# Patient Record
Sex: Female | Born: 1940 | Race: White | Hispanic: No | Marital: Single | Smoking: Former smoker
Health system: Southern US, Community
[De-identification: ages and names within clinical notes are randomized; demographics above are authoritative.]

## PROBLEM LIST (undated history)

## (undated) DIAGNOSIS — I214 Non-ST elevation (NSTEMI) myocardial infarction: Secondary | ICD-10-CM

## (undated) DIAGNOSIS — E119 Type 2 diabetes mellitus without complications: Secondary | ICD-10-CM

## (undated) DIAGNOSIS — I10 Essential (primary) hypertension: Secondary | ICD-10-CM

## (undated) DIAGNOSIS — I639 Cerebral infarction, unspecified: Secondary | ICD-10-CM

## (undated) DIAGNOSIS — I719 Aortic aneurysm of unspecified site, without rupture: Secondary | ICD-10-CM

## (undated) HISTORY — PX: CORONARY ANGIOPLASTY: SHX604

## (undated) HISTORY — PX: APPENDECTOMY: SHX54

## (undated) HISTORY — DX: Non-ST elevation (NSTEMI) myocardial infarction: I21.4

## (undated) HISTORY — PX: CARDIAC CATHETERIZATION: SHX172

## (undated) HISTORY — PX: COLON SURGERY: SHX602

---

## 2021-04-04 ENCOUNTER — Emergency Department (HOSPITAL_BASED_OUTPATIENT_CLINIC_OR_DEPARTMENT_OTHER): Payer: Medicare Other

## 2021-04-04 ENCOUNTER — Encounter (HOSPITAL_BASED_OUTPATIENT_CLINIC_OR_DEPARTMENT_OTHER): Payer: Self-pay | Admitting: Urology

## 2021-04-04 ENCOUNTER — Inpatient Hospital Stay (HOSPITAL_BASED_OUTPATIENT_CLINIC_OR_DEPARTMENT_OTHER)
Admission: EM | Admit: 2021-04-04 | Discharge: 2021-04-13 | DRG: 036 | Disposition: A | Discharge status: Home Health | Payer: Medicare Other | Attending: Internal Medicine | Admitting: Internal Medicine

## 2021-04-04 DIAGNOSIS — E119 Type 2 diabetes mellitus without complications: Secondary | ICD-10-CM | POA: Diagnosis not present

## 2021-04-04 DIAGNOSIS — I129 Hypertensive chronic kidney disease with stage 1 through stage 4 chronic kidney disease, or unspecified chronic kidney disease: Secondary | ICD-10-CM | POA: Diagnosis present

## 2021-04-04 DIAGNOSIS — Z20822 Contact with and (suspected) exposure to covid-19: Secondary | ICD-10-CM | POA: Diagnosis present

## 2021-04-04 DIAGNOSIS — Z79890 Hormone replacement therapy (postmenopausal): Secondary | ICD-10-CM

## 2021-04-04 DIAGNOSIS — Z95828 Presence of other vascular implants and grafts: Secondary | ICD-10-CM | POA: Diagnosis not present

## 2021-04-04 DIAGNOSIS — Z883 Allergy status to other anti-infective agents status: Secondary | ICD-10-CM

## 2021-04-04 DIAGNOSIS — R471 Dysarthria and anarthria: Secondary | ICD-10-CM | POA: Diagnosis present

## 2021-04-04 DIAGNOSIS — R29700 NIHSS score 0: Secondary | ICD-10-CM | POA: Diagnosis present

## 2021-04-04 DIAGNOSIS — I712 Thoracic aortic aneurysm, without rupture: Secondary | ICD-10-CM | POA: Diagnosis present

## 2021-04-04 DIAGNOSIS — I63411 Cerebral infarction due to embolism of right middle cerebral artery: Secondary | ICD-10-CM | POA: Diagnosis not present

## 2021-04-04 DIAGNOSIS — R29810 Facial weakness: Secondary | ICD-10-CM | POA: Diagnosis present

## 2021-04-04 DIAGNOSIS — Z79899 Other long term (current) drug therapy: Secondary | ICD-10-CM

## 2021-04-04 DIAGNOSIS — D649 Anemia, unspecified: Secondary | ICD-10-CM | POA: Diagnosis not present

## 2021-04-04 DIAGNOSIS — I252 Old myocardial infarction: Secondary | ICD-10-CM

## 2021-04-04 DIAGNOSIS — R4701 Aphasia: Secondary | ICD-10-CM | POA: Diagnosis present

## 2021-04-04 DIAGNOSIS — R1310 Dysphagia, unspecified: Secondary | ICD-10-CM | POA: Diagnosis present

## 2021-04-04 DIAGNOSIS — R339 Retention of urine, unspecified: Secondary | ICD-10-CM | POA: Diagnosis not present

## 2021-04-04 DIAGNOSIS — I2510 Atherosclerotic heart disease of native coronary artery without angina pectoris: Secondary | ICD-10-CM | POA: Diagnosis present

## 2021-04-04 DIAGNOSIS — I6389 Other cerebral infarction: Secondary | ICD-10-CM | POA: Diagnosis not present

## 2021-04-04 DIAGNOSIS — I6340 Cerebral infarction due to embolism of unspecified cerebral artery: Secondary | ICD-10-CM | POA: Insufficient documentation

## 2021-04-04 DIAGNOSIS — E1122 Type 2 diabetes mellitus with diabetic chronic kidney disease: Secondary | ICD-10-CM | POA: Diagnosis present

## 2021-04-04 DIAGNOSIS — Z882 Allergy status to sulfonamides status: Secondary | ICD-10-CM

## 2021-04-04 DIAGNOSIS — Z7984 Long term (current) use of oral hypoglycemic drugs: Secondary | ICD-10-CM

## 2021-04-04 DIAGNOSIS — G4733 Obstructive sleep apnea (adult) (pediatric): Secondary | ICD-10-CM | POA: Diagnosis present

## 2021-04-04 DIAGNOSIS — E039 Hypothyroidism, unspecified: Secondary | ICD-10-CM

## 2021-04-04 DIAGNOSIS — Z91040 Latex allergy status: Secondary | ICD-10-CM

## 2021-04-04 DIAGNOSIS — Z006 Encounter for examination for normal comparison and control in clinical research program: Secondary | ICD-10-CM

## 2021-04-04 DIAGNOSIS — E785 Hyperlipidemia, unspecified: Secondary | ICD-10-CM | POA: Diagnosis present

## 2021-04-04 DIAGNOSIS — E1151 Type 2 diabetes mellitus with diabetic peripheral angiopathy without gangrene: Secondary | ICD-10-CM | POA: Diagnosis present

## 2021-04-04 DIAGNOSIS — E1165 Type 2 diabetes mellitus with hyperglycemia: Secondary | ICD-10-CM | POA: Diagnosis present

## 2021-04-04 DIAGNOSIS — N182 Chronic kidney disease, stage 2 (mild): Secondary | ICD-10-CM | POA: Diagnosis present

## 2021-04-04 DIAGNOSIS — Z881 Allergy status to other antibiotic agents status: Secondary | ICD-10-CM

## 2021-04-04 DIAGNOSIS — I6521 Occlusion and stenosis of right carotid artery: Secondary | ICD-10-CM

## 2021-04-04 DIAGNOSIS — Z885 Allergy status to narcotic agent status: Secondary | ICD-10-CM

## 2021-04-04 DIAGNOSIS — Z87891 Personal history of nicotine dependence: Secondary | ICD-10-CM | POA: Diagnosis not present

## 2021-04-04 DIAGNOSIS — Z833 Family history of diabetes mellitus: Secondary | ICD-10-CM | POA: Diagnosis not present

## 2021-04-04 DIAGNOSIS — I700 Atherosclerosis of aorta: Secondary | ICD-10-CM | POA: Diagnosis present

## 2021-04-04 DIAGNOSIS — Z7902 Long term (current) use of antithrombotics/antiplatelets: Secondary | ICD-10-CM

## 2021-04-04 DIAGNOSIS — I6523 Occlusion and stenosis of bilateral carotid arteries: Secondary | ICD-10-CM | POA: Diagnosis not present

## 2021-04-04 DIAGNOSIS — I10 Essential (primary) hypertension: Secondary | ICD-10-CM | POA: Diagnosis not present

## 2021-04-04 DIAGNOSIS — E1159 Type 2 diabetes mellitus with other circulatory complications: Secondary | ICD-10-CM | POA: Diagnosis not present

## 2021-04-04 DIAGNOSIS — Z7982 Long term (current) use of aspirin: Secondary | ICD-10-CM

## 2021-04-04 DIAGNOSIS — Z8673 Personal history of transient ischemic attack (TIA), and cerebral infarction without residual deficits: Secondary | ICD-10-CM | POA: Diagnosis not present

## 2021-04-04 DIAGNOSIS — Z955 Presence of coronary angioplasty implant and graft: Secondary | ICD-10-CM | POA: Diagnosis not present

## 2021-04-04 DIAGNOSIS — I7121 Aneurysm of the ascending aorta, without rupture: Secondary | ICD-10-CM

## 2021-04-04 DIAGNOSIS — D631 Anemia in chronic kidney disease: Secondary | ICD-10-CM | POA: Diagnosis present

## 2021-04-04 DIAGNOSIS — I639 Cerebral infarction, unspecified: Secondary | ICD-10-CM | POA: Diagnosis not present

## 2021-04-04 DIAGNOSIS — Z794 Long term (current) use of insulin: Secondary | ICD-10-CM

## 2021-04-04 DIAGNOSIS — I63231 Cerebral infarction due to unspecified occlusion or stenosis of right carotid arteries: Secondary | ICD-10-CM | POA: Diagnosis not present

## 2021-04-04 HISTORY — DX: Cerebral infarction, unspecified: I63.9

## 2021-04-04 HISTORY — DX: Essential (primary) hypertension: I10

## 2021-04-04 HISTORY — DX: Type 2 diabetes mellitus without complications: E11.9

## 2021-04-04 HISTORY — DX: Aortic aneurysm of unspecified site, without rupture: I71.9

## 2021-04-04 LAB — CBC WITH DIFFERENTIAL/PLATELET
Abs Immature Granulocytes: 0.04 10*3/uL (ref 0.00–0.07)
Basophils Absolute: 0.1 10*3/uL (ref 0.0–0.1)
Basophils Relative: 1 %
Eosinophils Absolute: 0.3 10*3/uL (ref 0.0–0.5)
Eosinophils Relative: 3 %
HCT: 37.6 % (ref 36.0–46.0)
Hemoglobin: 11.9 g/dL — ABNORMAL LOW (ref 12.0–15.0)
Immature Granulocytes: 0 %
Lymphocytes Relative: 22 %
Lymphs Abs: 2.4 10*3/uL (ref 0.7–4.0)
MCH: 26.3 pg (ref 26.0–34.0)
MCHC: 31.6 g/dL (ref 30.0–36.0)
MCV: 83.2 fL (ref 80.0–100.0)
Monocytes Absolute: 0.9 10*3/uL (ref 0.1–1.0)
Monocytes Relative: 9 %
Neutro Abs: 7.1 10*3/uL (ref 1.7–7.7)
Neutrophils Relative %: 65 %
Platelets: 274 10*3/uL (ref 150–400)
RBC: 4.52 MIL/uL (ref 3.87–5.11)
RDW: 15.9 % — ABNORMAL HIGH (ref 11.5–15.5)
WBC: 10.8 10*3/uL — ABNORMAL HIGH (ref 4.0–10.5)
nRBC: 0 % (ref 0.0–0.2)

## 2021-04-04 LAB — URINALYSIS, MICROSCOPIC (REFLEX): RBC / HPF: NONE SEEN RBC/hpf (ref 0–5)

## 2021-04-04 LAB — RAPID URINE DRUG SCREEN, HOSP PERFORMED
Amphetamines: NOT DETECTED
Barbiturates: NOT DETECTED
Benzodiazepines: NOT DETECTED
Cocaine: NOT DETECTED
Opiates: NOT DETECTED
Tetrahydrocannabinol: NOT DETECTED

## 2021-04-04 LAB — APTT: aPTT: 30 seconds (ref 24–36)

## 2021-04-04 LAB — PROTIME-INR
INR: 1.1 (ref 0.8–1.2)
Prothrombin Time: 14.5 seconds (ref 11.4–15.2)

## 2021-04-04 LAB — COMPREHENSIVE METABOLIC PANEL
ALT: 10 U/L (ref 0–44)
AST: 17 U/L (ref 15–41)
Albumin: 3.6 g/dL (ref 3.5–5.0)
Alkaline Phosphatase: 96 U/L (ref 38–126)
Anion gap: 8 (ref 5–15)
BUN: 26 mg/dL — ABNORMAL HIGH (ref 8–23)
CO2: 27 mmol/L (ref 22–32)
Calcium: 9.4 mg/dL (ref 8.9–10.3)
Chloride: 101 mmol/L (ref 98–111)
Creatinine, Ser: 1.09 mg/dL — ABNORMAL HIGH (ref 0.44–1.00)
GFR, Estimated: 52 mL/min — ABNORMAL LOW (ref >60)
Glucose, Bld: 168 mg/dL — ABNORMAL HIGH (ref 70–99)
Potassium: 4 mmol/L (ref 3.5–5.1)
Sodium: 136 mmol/L (ref 135–145)
Total Bilirubin: 0.7 mg/dL (ref 0.3–1.2)
Total Protein: 6.8 g/dL (ref 6.5–8.1)

## 2021-04-04 LAB — URINALYSIS, ROUTINE W REFLEX MICROSCOPIC
Bilirubin Urine: NEGATIVE
Glucose, UA: NEGATIVE mg/dL
Hgb urine dipstick: NEGATIVE
Ketones, ur: NEGATIVE mg/dL
Leukocytes,Ua: NEGATIVE
Nitrite: POSITIVE — AB
Protein, ur: NEGATIVE mg/dL
Specific Gravity, Urine: 1.02 (ref 1.005–1.030)
pH: 7 (ref 5.0–8.0)

## 2021-04-04 LAB — RESP PANEL BY RT-PCR (FLU A&B, COVID) ARPGX2
Influenza A by PCR: NEGATIVE
Influenza B by PCR: NEGATIVE
SARS Coronavirus 2 by RT PCR: NEGATIVE

## 2021-04-04 LAB — CBG MONITORING, ED: Glucose-Capillary: 173 mg/dL — ABNORMAL HIGH (ref 70–99)

## 2021-04-04 LAB — ETHANOL: Alcohol, Ethyl (B): <10 mg/dL (ref <10)

## 2021-04-04 IMAGING — CT CT ANGIO HEAD
1 of 8 series · 6 of 33 positions shown · IV contrast (Omnipaque)
Comparison: Prior head CT from earlier the same day.

CLINICAL DATA: Initial evaluation for acute stroke.

EXAM:
CT ANGIOGRAPHY HEAD AND NECK
TECHNIQUE: Multidetector CT imaging of the head and neck was performed using
the standard protocol during bolus administration of intravenous
contrast. Multiplanar CT image reconstructions and MIPs were
obtained to evaluate the vascular anatomy. Carotid stenosis
measurements (when applicable) are obtained utilizing NASCET
criteria, using the distal internal carotid diameter as the
denominator.
CONTRAST:  60mL OMNIPAQUE IOHEXOL 350 MG/ML SOLN

[Series 7: axial thin · axial · 0.58mm/px · z∈[+980,+1254]mm · 6 of 383 slices shown]
[im 55/383  soft-tissue]
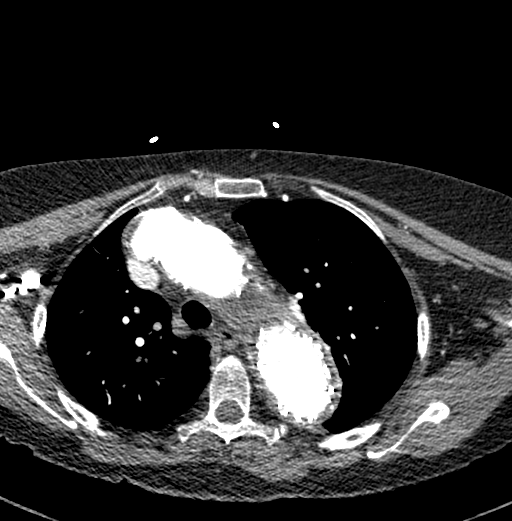
[im 110/383  bone]
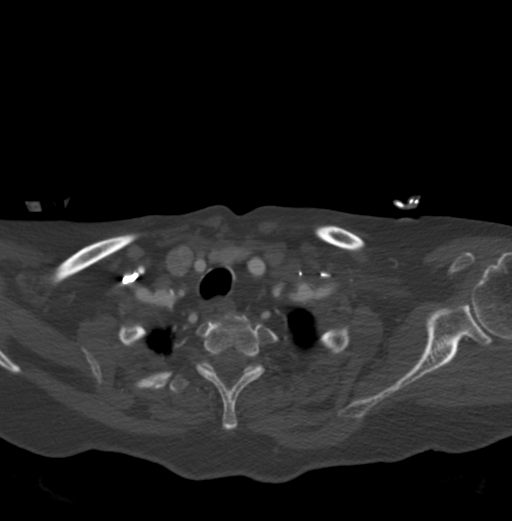
[im 164/383  soft-tissue]
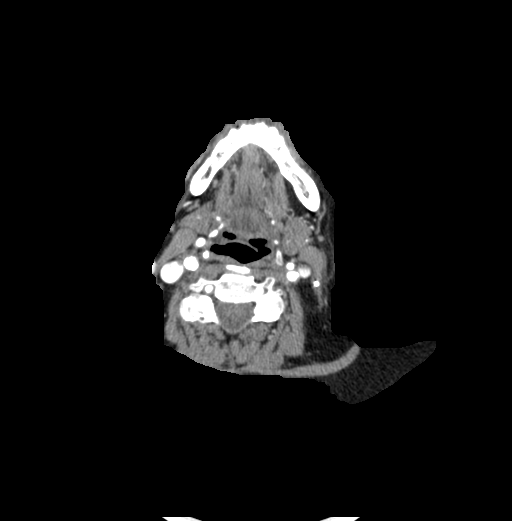
[im 219/383  bone]
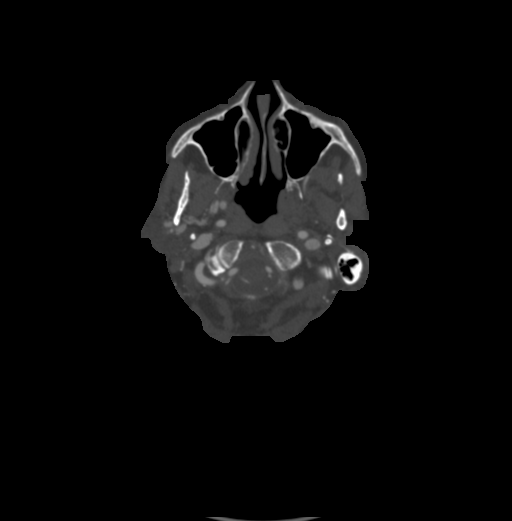
[im 273/383  soft-tissue]
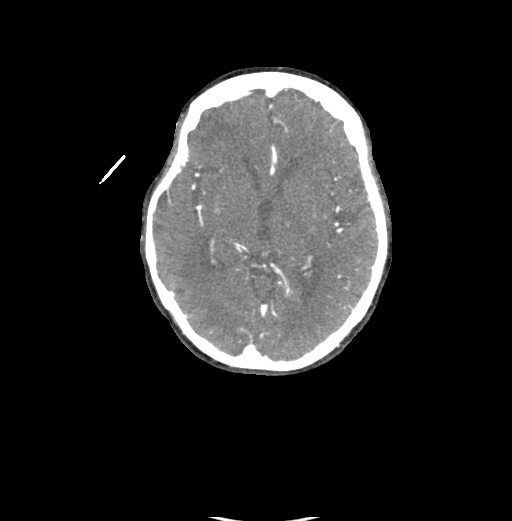
[im 328/383  bone]
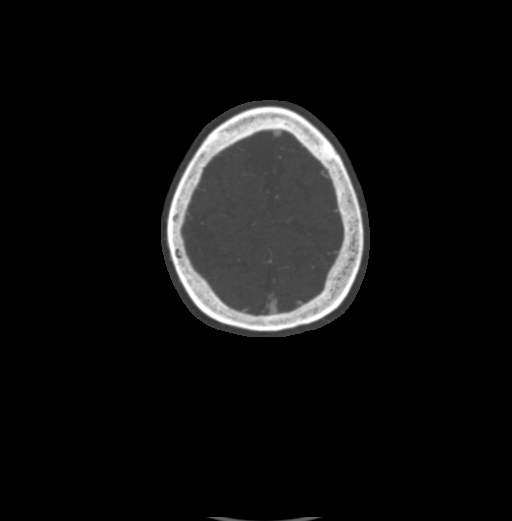

[6 of 33 positions shown; findings below may reference images not displayed]

FINDINGS: CTA NECK FINDINGS

Aortic arch: Aneurysmal dilatation of the visualized intrathoracic
aorta with sequelae of prior stent endograft repair, partially
visualized. Origin of the innominate artery is markedly irregular
without high-grade stenosis. Left subclavian artery occluded at its
origin. Left common to subclavian artery graft in place with
perfusion of the left subclavian artery distally. Widely patent flow
through the graft.

Right carotid system: Right CCA patent to the bifurcation without
stenosis. Bulky calcified plaque about the right carotid bulb with
associated 50% stenosis by NASCET criteria. Right ICA widely patent
distally.

Left carotid system: Left CCA patent from its origin to the
bifurcation without stenosis. Bulky calcified plaque about the left
carotid bulb/proximal left ICA with associated 40% stenosis by
NASCET criteria. Left ICA widely patent distally.

Vertebral arteries: Both vertebral arteries arise from subclavian
arteries. Vertebral arteries widely patent within the neck without
stenosis or other abnormality.

Skeleton: No visible discrete osseous lesions.

Other neck: Postsurgical changes present within the left neck.

Upper chest: Cardiomegaly, partially visualized.

Review of the MIP images confirms the above findings

CTA HEAD FINDINGS

Anterior circulation: Petrous segments patent bilaterally. Scattered
atheromatous change throughout the carotid siphons with associated
moderate multifocal narrowing. A1 segments patent bilaterally.
Normal anterior communicating artery complex. Anterior cerebral
arteries widely patent bilaterally. No M1 stenosis or occlusion.
Distal MCA branches well perfused and symmetric.

Posterior circulation: Both V4 segments patent to the
vertebrobasilar junction without stenosis. Both PICA origins patent
and normal. Basilar widely patent to its distal aspect without
stenosis. Superior cerebellar arteries patent bilaterally. Both PCAs
primarily supplied via the basilar and are well perfused to there
distal aspects.

Venous sinuses: Patent allowing for timing the contrast bolus.

Anatomic variants: None significant.  No visible aneurysm.

Review of the MIP images confirms the above findings
IMPRESSION: 1. Negative CTA for large vessel occlusion.
2. Bulky calcified plaque about the carotid bifurcations
bilaterally, with associated stenoses of up to 50% on the right and
40% on the left.
3. Occluded left subclavian artery at its origin. Left common to
subclavian artery graft in place with perfusion of the left
subclavian artery distally. Widely patent flow through the graft.
4. Aneurysmal dilatation of the intrathoracic aorta with sequelae of
prior stent endograft repair, partially visualized.
5. Cardiomegaly.

## 2021-04-04 IMAGING — CT CT HEAD W/O CM
4 series · 15 of 47 positions shown, 17 images · non-contrast
Comparison: None.

CLINICAL DATA: Neuro deficit, acute, stroke suspected

EXAM:
CT HEAD WITHOUT CONTRAST
TECHNIQUE: Contiguous axial images were obtained from the base of the skull
through the vertex without intravenous contrast.

[Series 2: head wo · axial · 0.41mm/px · z∈[+1124,+1224]mm · 6 of 29 slices shown, 8 images]
[im 5/29  brain]
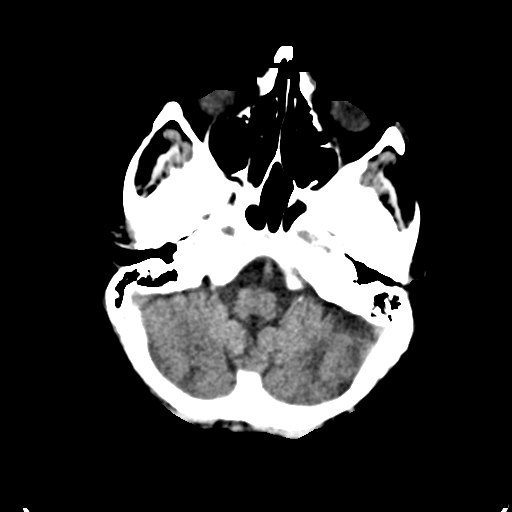
[im 5/29  bone]
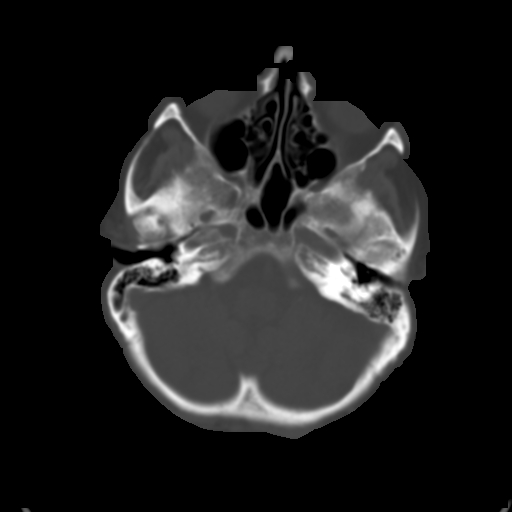
[im 9/29  brain]
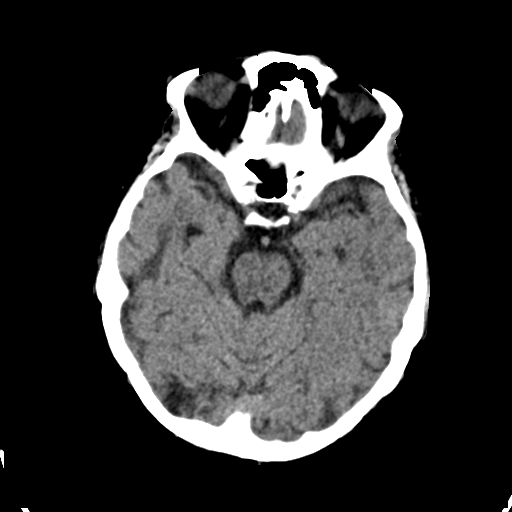
[im 13/29  brain]
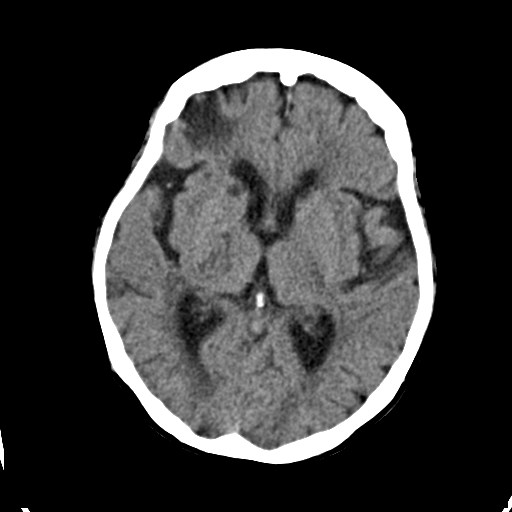
[im 17/29  brain]
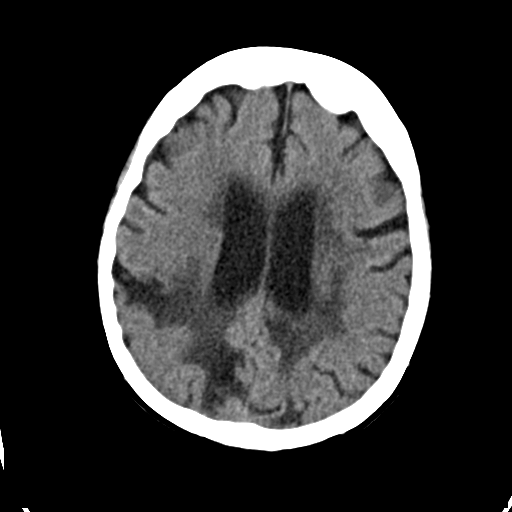
[im 21/29  brain]
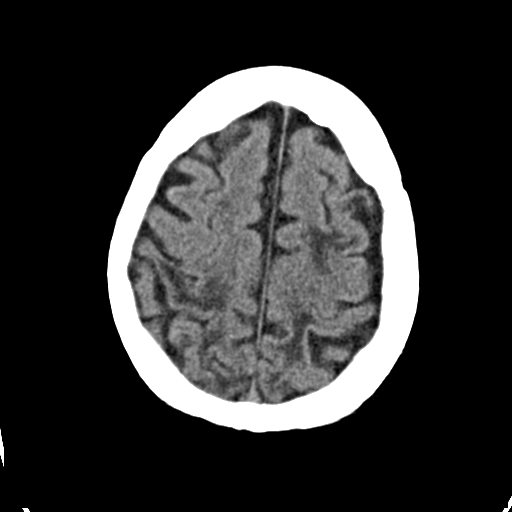
[im 21/29  bone]
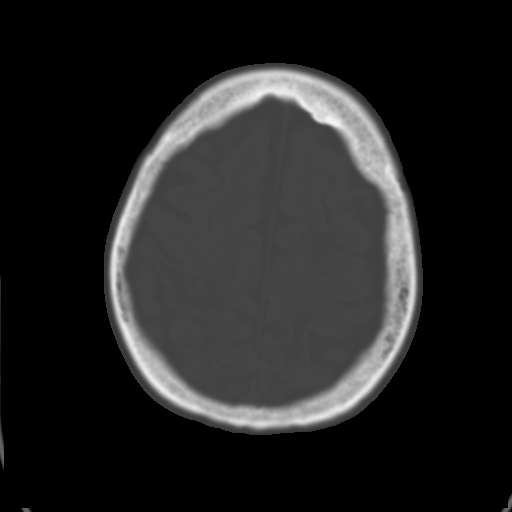
[im 25/29  brain]
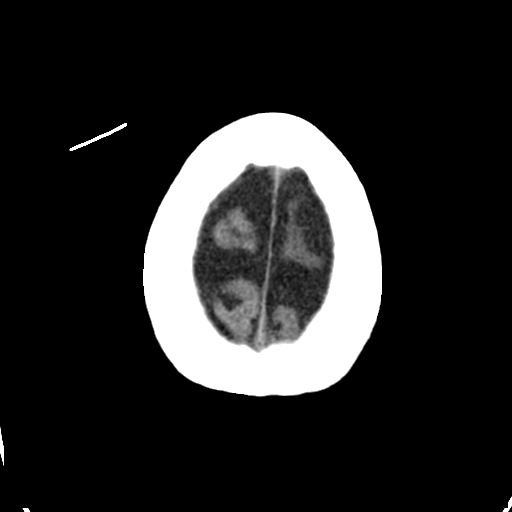

[Series 3: head bone · axial · 0.41mm/px · z∈[+1118,+1154]mm · 3 of 77 slices shown]
[im 8/77  bone]
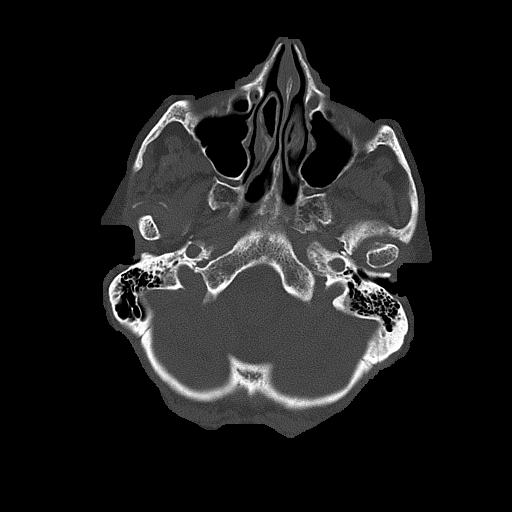
[im 15/77  bone]
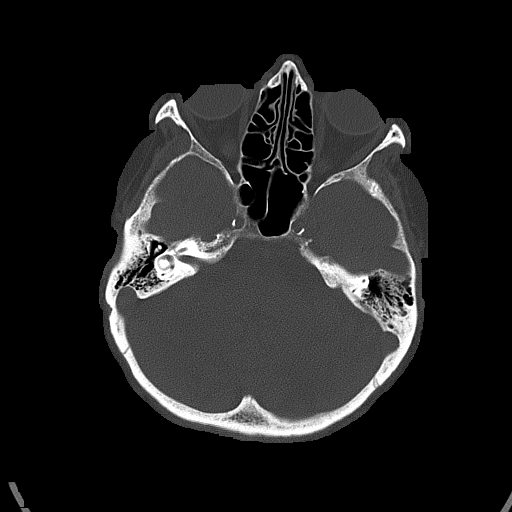
[im 26/77  bone]
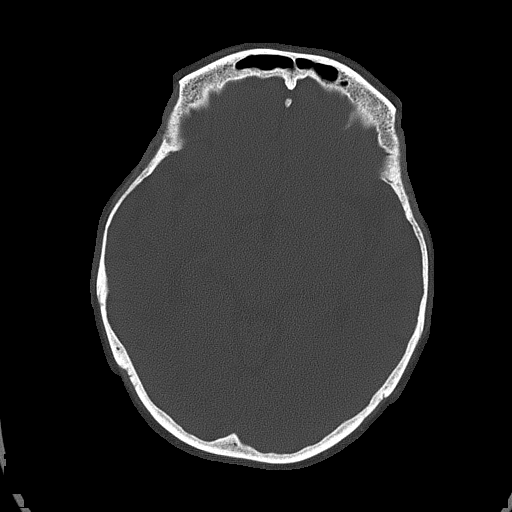

[Series 4: coronal soft · coronal · 0.30mm/px · 3 of 67 slices shown]
[im 23/67  brain]
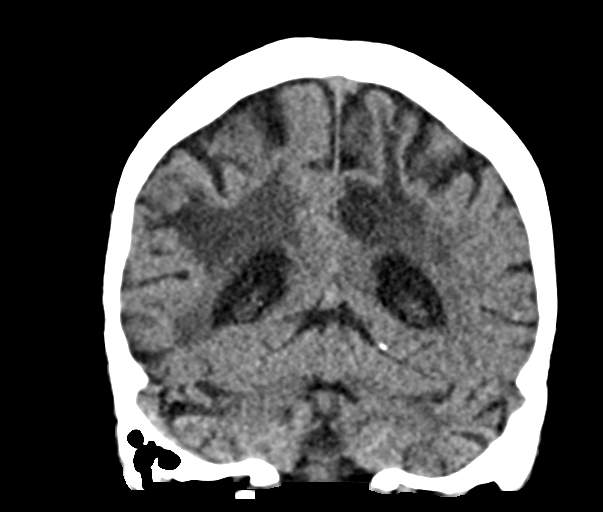
[im 30/67  brain]
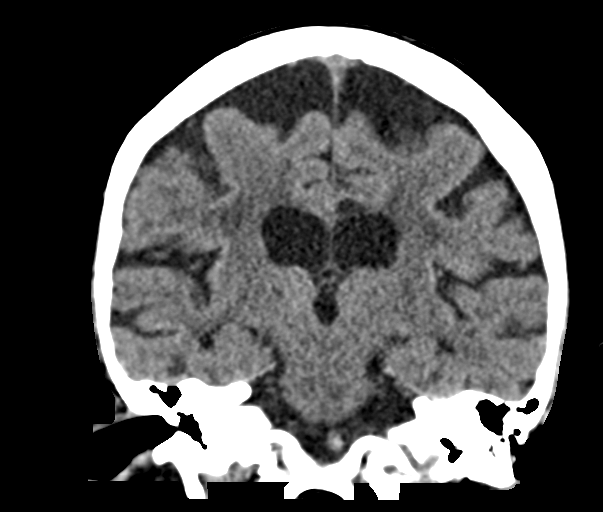
[im 37/67  brain]
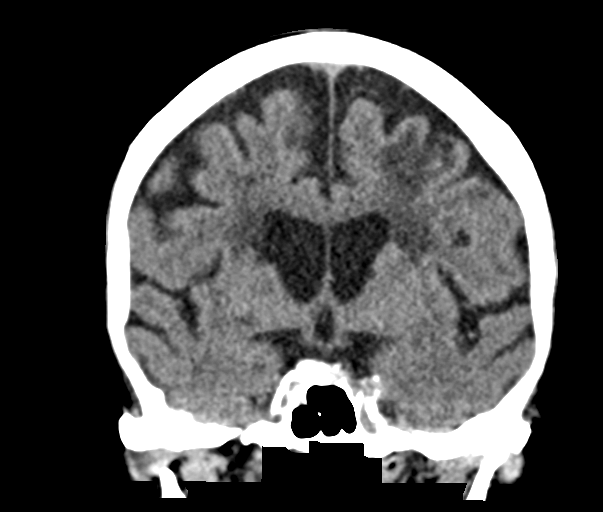

[Series 5: sag soft · sagittal · 0.30mm/px · 3 of 66 slices shown]
[im 22/66  brain]
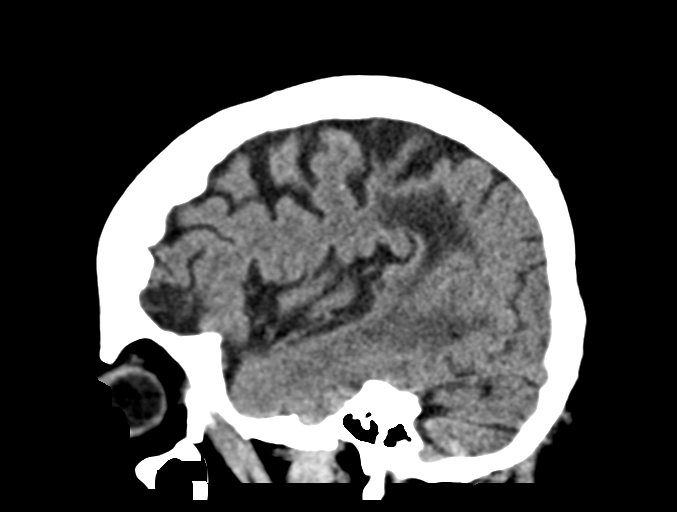
[im 33/66  brain]
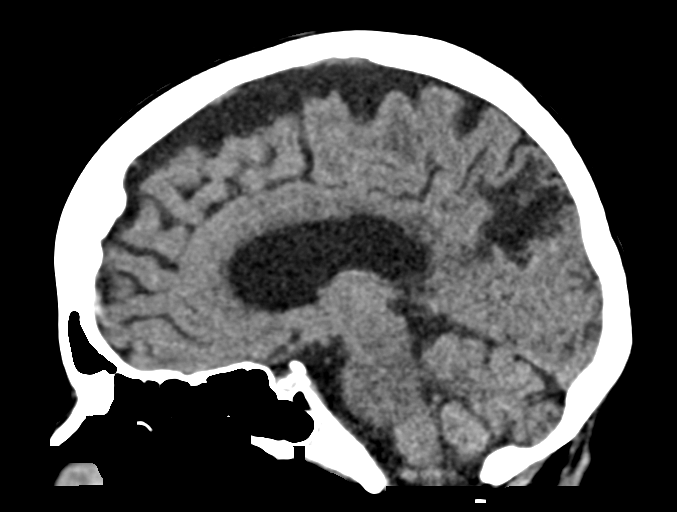
[im 44/66  brain]
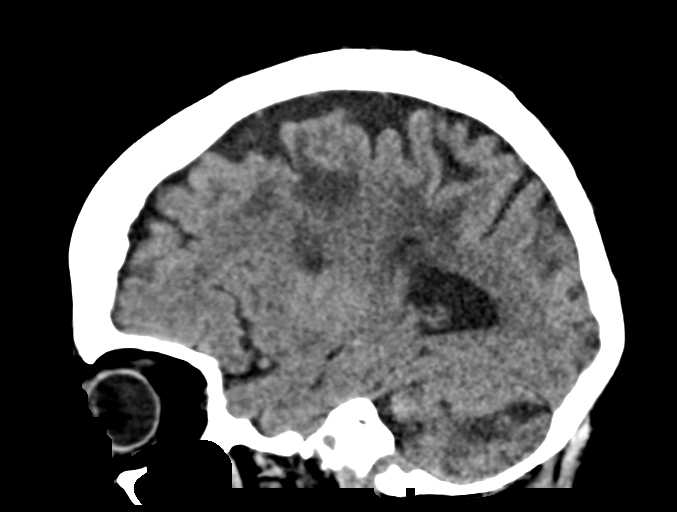

[15 of 47 positions shown; findings below may reference images not displayed]

FINDINGS: Brain: There is normal anatomic configuration of the brain. Mild
parenchymal volume loss is commensurate with the patient's age. Mild
periventricular white matter changes are present likely reflecting
the sequela of small vessel ischemia.

There are areas of encephalomalacia involving the cerebellar
hemispheres bilaterally, right temporoparietal cortex, inferior
right frontal lobe, and left high frontoparietal cortex possibly the
sequela of remote infarction. Additional lacunar infarcts are noted
within the right lentiform nucleus, right caudate head, left corona
radiata, right pons, and cerebellum.

No definite evidence of acute intracranial infarct. No acute
hemorrhage. No abnormal mass effect or midline shift. No intra or
extra-axial mass lesion. Ventricular size is normal.

Vascular: No asymmetric hyperdense vasculature at the skull base.
Extensive atherosclerotic calcification within the carotid siphons.

Skull: Intact

Sinuses/Orbits: The visualized paranasal sinuses are clear. The
orbits are unremarkable.

Other: Mastoid air cells and middle ear cavities are clear.
IMPRESSION: No acute intracranial abnormality identified.

Multiple supratentorial and infratentorial foci of encephalomalacia,
likely representing multiple infarcts in both anterior and posterior
circulatory territories. This may reflect the sequela of a central
embolic source.

Mild senescent change.

## 2021-04-04 MED ORDER — IOHEXOL 350 MG/ML SOLN
60.0000 mL | Freq: Once | INTRAVENOUS | Status: AC | PRN
  Administered 2021-04-04: 60 mL via INTRAVENOUS

## 2021-04-04 NOTE — Progress Notes (Signed)
Patient arrived via [facility] to unit. Patient placed on telemetry. CCMD notfied. CHG done. Call bell in place

## 2021-04-04 NOTE — ED Notes (Signed)
Teleneuro in progress. 

## 2021-04-04 NOTE — ED Notes (Signed)
Patient transported to CT 

## 2021-04-04 NOTE — ED Notes (Signed)
Contact information for pt  Erin Baker, daughter, [place]  [phone] Asher Muir, granddaughter [phone]

## 2021-04-04 NOTE — Consult Note (Signed)
[facility] Tele[facility]rology Consult Services   Date of Service:   04/04/2021 20:19:36  Diagnosis:       R47.81 - Slurred speech  Impression:      80 year old female presents the hospital for dysarthria that appears to have resolved at this time. Differential includes toxic metabolic etiology, stroke/TIA; no thrombolytics due to outside window and no residual disabling deficits. Recommend tox metabolic work-up in ER. If no clear etiology found for her symptoms suggest admission to the hospital for an MRI brain without contrast to definitively rule out stroke and neuro follow-up.  Metrics: Last Known Well: 04/04/2021 15:00:00 [facility] Notification Time: 04/04/2021 20:19:19 Arrival Time: 04/04/2021 19:39:00 Stamp Time: 04/04/2021 20:19:36 Initial Response Time: 04/04/2021 20:21:25 Symptoms: Dysarthria. NIHSS Start Assessment Time: 04/04/2021 55:73:22 Patient is not a candidate for Thrombolytic. Thrombolytic Medical Decision: 04/04/2021 20:27:00 Patient was not deemed candidate for Thrombolytic because of following reasons: Last Well Known Above 4.5 Hours. Resolved symptoms (no residual disabling symptoms).  CT head was reviewed and results were: I personally Reviewed the CT Head and it Showed no acute abnl  ED Physician notified of diagnostic impression and management plan on 04/04/2021 20:36:12  Advanced Imaging: Advanced Imaging Not Recommended because: Symptoms not consistent with LVO, NIH of scale 0   Our recommendations are outlined below.  Recommendations:       Toxic metabolic work-up per ED       Cardiac evaluation per ED       If no clear etiology found with the above testing recommend admission to the hospital for an MRI brain without contrast and neuro follow-up       Continue Plavix  Routine Consultation with Inhouse Neurology for Follow up Care  Sign Out:       Discussed with Emergency Department  Provider    ------------------------------------------------------------------------------  History of Present Illness: Patient is a 80 year old Female.  Patient was brought by private transportation with symptoms of Dysarthria.  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 Symptoms started at 3 PM while she was on the phone. She felt her tongue was "swollen" and she had difficulty enunciating her words. However, no clear paraphasic errors during this event. Symptoms as seen. Her examination currently is nonfocal. Is essentially back to baseline now she feels much better denies any residual deficits from her prior strokes other than some mild gait instability.   Past Medical History:      Hypertension      Stroke  Anticoagulant use:  No  Antiplatelet use: Yes plavix  Allergies:  Reviewed    Examination: BP(163/79), Pulse(76), Blood Glucose(173) 1A: Level of Consciousness - Alert; keenly responsive + 0 1B: Ask Month and Age - Both Questions Right + 0 1C: Blink Eyes & Squeeze Hands - Performs Both Tasks + 0 2: Test Horizontal Extraocular Movements - Normal + 0 3: Test Visual Fields - No Visual Loss + 0 4: Test Facial Palsy (Use Grimace if Obtunded) - Normal symmetry + 0 5A: Test Left Arm Motor Drift - No Drift for 10 Seconds + 0 5B: Test Right Arm Motor Drift - No Drift for 10 Seconds + 0 6A: Test Left Leg Motor Drift - No Drift for 5 Seconds + 0 6B: Test Right Leg Motor Drift - No Drift for 5 Seconds + 0 7: Test Limb Ataxia (FNF/Heel-Shin) - No Ataxia + 0 8: Test Sensation - Normal; No sensory loss + 0 9: Test Language/Aphasia -  Normal; No aphasia + 0 10: Test Dysarthria - Normal + 0 11: Test Extinction/Inattention - No abnormality + 0  NIHSS Score: 0   Pre-Morbid Modified Rankin Scale: 0 Points = No symptoms at all   Patient/Family was informed the Neurology Consult would occur via  TeleHealth consult by way of interactive audio and video telecommunications and consented to receiving care in this manner.   Patient is being evaluated for possible acute neurologic impairment and high probability of imminent or life-threatening deterioration. I spent total of 35 minutes providing care to this patient, including time for face to face visit via telemedicine, review of medical records, imaging studies and discussion of findings with providers, the patient and/or family.   Dr Lacie Scotts   [facility] [phone]  Case [id number]

## 2021-04-04 NOTE — ED Notes (Signed)
Transferred to [facility] via [facility] 

## 2021-04-04 NOTE — ED Notes (Signed)
Report given to Ryan with [facility]  

## 2021-04-04 NOTE — ED Notes (Signed)
Checked CBG 173, RN Jessie informed

## 2021-04-04 NOTE — ED Notes (Signed)
ED Provider at bedside. 

## 2021-04-04 NOTE — ED Triage Notes (Signed)
Pt states daughter noticed over phone at 1500 today that she had slurred speech.  Previous h/o stroke.  Pt states speech better now.  States "tongue felt fat and I couldn't get my words out".

## 2021-04-05 ENCOUNTER — Inpatient Hospital Stay (HOSPITAL_COMMUNITY): Payer: Medicare Other

## 2021-04-05 ENCOUNTER — Other Ambulatory Visit: Payer: Self-pay

## 2021-04-05 ENCOUNTER — Encounter (HOSPITAL_COMMUNITY): Payer: Self-pay | Admitting: Family Medicine

## 2021-04-05 DIAGNOSIS — I63231 Cerebral infarction due to unspecified occlusion or stenosis of right carotid arteries: Secondary | ICD-10-CM

## 2021-04-05 DIAGNOSIS — E1159 Type 2 diabetes mellitus with other circulatory complications: Secondary | ICD-10-CM | POA: Diagnosis present

## 2021-04-05 DIAGNOSIS — Z8673 Personal history of transient ischemic attack (TIA), and cerebral infarction without residual deficits: Secondary | ICD-10-CM

## 2021-04-05 DIAGNOSIS — R471 Dysarthria and anarthria: Secondary | ICD-10-CM

## 2021-04-05 DIAGNOSIS — I63411 Cerebral infarction due to embolism of right middle cerebral artery: Secondary | ICD-10-CM

## 2021-04-05 DIAGNOSIS — I6389 Other cerebral infarction: Secondary | ICD-10-CM | POA: Diagnosis not present

## 2021-04-05 DIAGNOSIS — I6340 Cerebral infarction due to embolism of unspecified cerebral artery: Secondary | ICD-10-CM | POA: Insufficient documentation

## 2021-04-05 DIAGNOSIS — I10 Essential (primary) hypertension: Secondary | ICD-10-CM

## 2021-04-05 DIAGNOSIS — I6523 Occlusion and stenosis of bilateral carotid arteries: Secondary | ICD-10-CM

## 2021-04-05 DIAGNOSIS — D649 Anemia, unspecified: Secondary | ICD-10-CM | POA: Diagnosis present

## 2021-04-05 DIAGNOSIS — Z95828 Presence of other vascular implants and grafts: Secondary | ICD-10-CM

## 2021-04-05 DIAGNOSIS — E119 Type 2 diabetes mellitus without complications: Secondary | ICD-10-CM

## 2021-04-05 DIAGNOSIS — I2510 Atherosclerotic heart disease of native coronary artery without angina pectoris: Secondary | ICD-10-CM | POA: Diagnosis present

## 2021-04-05 DIAGNOSIS — Z955 Presence of coronary angioplasty implant and graft: Secondary | ICD-10-CM

## 2021-04-05 DIAGNOSIS — Z87891 Personal history of nicotine dependence: Secondary | ICD-10-CM

## 2021-04-05 LAB — CBC
HCT: 35 % — ABNORMAL LOW (ref 36.0–46.0)
Hemoglobin: 10.7 g/dL — ABNORMAL LOW (ref 12.0–15.0)
MCH: 25.4 pg — ABNORMAL LOW (ref 26.0–34.0)
MCHC: 30.6 g/dL (ref 30.0–36.0)
MCV: 82.9 fL (ref 80.0–100.0)
Platelets: 231 10*3/uL (ref 150–400)
RBC: 4.22 MIL/uL (ref 3.87–5.11)
RDW: 15.9 % — ABNORMAL HIGH (ref 11.5–15.5)
WBC: 9.1 10*3/uL (ref 4.0–10.5)
nRBC: 0 % (ref 0.0–0.2)

## 2021-04-05 LAB — ECHOCARDIOGRAM COMPLETE
Area-P 1/2: 2.45 cm2
Calc EF: 57.4 %
Height: 65 in
S' Lateral: 3 cm
Single Plane A2C EF: 53.5 %
Single Plane A4C EF: 61.1 %
Weight: 2641.99 oz

## 2021-04-05 LAB — DIFFERENTIAL
Abs Immature Granulocytes: 0.03 10*3/uL (ref 0.00–0.07)
Basophils Absolute: 0 10*3/uL (ref 0.0–0.1)
Basophils Relative: 0 %
Eosinophils Absolute: 0.2 10*3/uL (ref 0.0–0.5)
Eosinophils Relative: 2 %
Immature Granulocytes: 0 %
Lymphocytes Relative: 21 %
Lymphs Abs: 1.9 10*3/uL (ref 0.7–4.0)
Monocytes Absolute: 0.9 10*3/uL (ref 0.1–1.0)
Monocytes Relative: 10 %
Neutro Abs: 6 10*3/uL (ref 1.7–7.7)
Neutrophils Relative %: 67 %

## 2021-04-05 LAB — GLUCOSE, CAPILLARY
Glucose-Capillary: 228 mg/dL — ABNORMAL HIGH (ref 70–99)
Glucose-Capillary: 143 mg/dL — ABNORMAL HIGH (ref 70–99)
Glucose-Capillary: 136 mg/dL — ABNORMAL HIGH (ref 70–99)
Glucose-Capillary: 191 mg/dL — ABNORMAL HIGH (ref 70–99)

## 2021-04-05 LAB — LIPID PANEL
Cholesterol: 137 mg/dL (ref 0–200)
HDL: 34 mg/dL — ABNORMAL LOW (ref >40)
LDL Cholesterol: 79 mg/dL (ref 0–99)
Total CHOL/HDL Ratio: 4 RATIO
Triglycerides: 118 mg/dL (ref <150)
VLDL: 24 mg/dL (ref 0–40)

## 2021-04-05 LAB — CREATININE, SERUM
Creatinine, Ser: 0.98 mg/dL (ref 0.44–1.00)
GFR, Estimated: 59 mL/min — ABNORMAL LOW (ref >60)

## 2021-04-05 LAB — HEMOGLOBIN A1C
Hgb A1c MFr Bld: 7.7 % — ABNORMAL HIGH (ref 4.8–5.6)
Mean Plasma Glucose: 174.29 mg/dL

## 2021-04-05 IMAGING — MR MR HEAD W/O CM
9 of 10 series · 36 of 48 positions shown · non-contrast
Comparison: Head CT [DATE]

CLINICAL DATA: Neuro deficit, acute, stroke suspected.

EXAM:
MRI HEAD WITHOUT CONTRAST
TECHNIQUE: Multiplanar, multiecho pulse sequences of the brain and surrounding
structures were obtained without intravenous contrast.

[Series 3: DWI · axial · 3.0mm · 1.09mm/px · z∈[-82,+80]mm · 9 of 110 slices shown (1 of 4)]
[im 1/110]
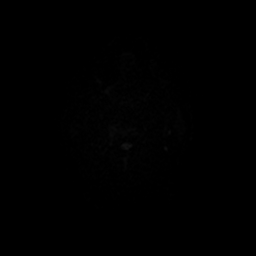
[im 20/110]
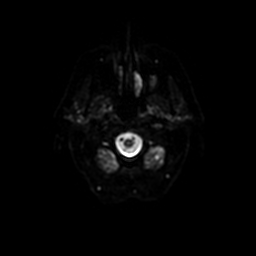
[im 30/110]
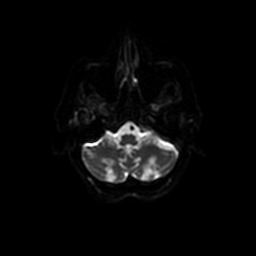
[im 50/110]
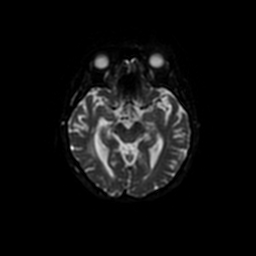
[im 60/110]
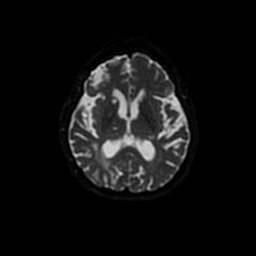
[im 80/110]
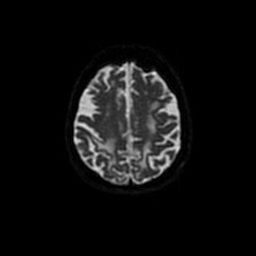
[im 90/110]
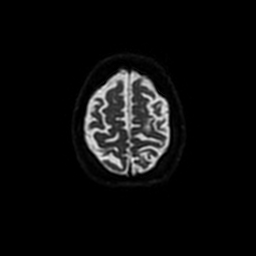
[im 100/110]
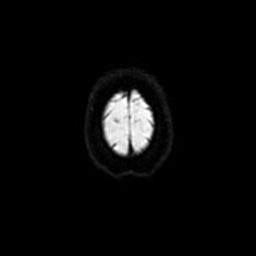
[im 110/110]
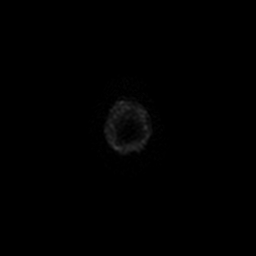

[Series 4: DWI · coronal · 5.0mm · 1.09mm/px · 7 of 74 slices shown (2 of 4)]
[im 1/74]
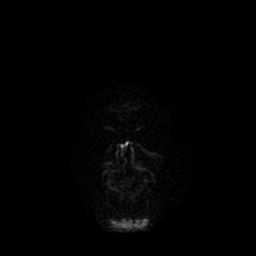
[im 13/74]
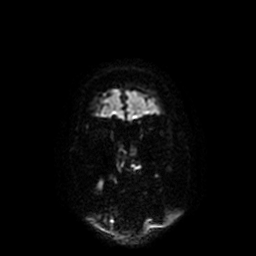
[im 25/74]
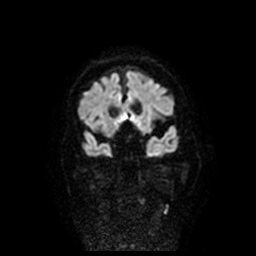
[im 37/74]
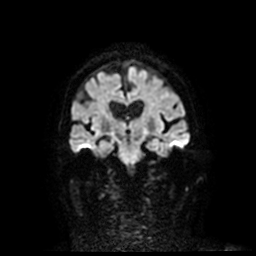
[im 49/74]
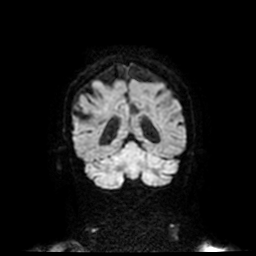
[im 61/74]
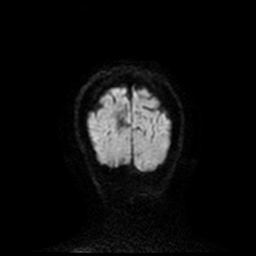
[im 74/74]
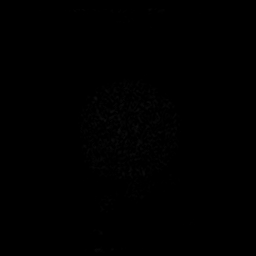

[Series 5: T1 · sagittal · 5.0mm · 0.47mm/px · 2 of 23 slices shown (1 of 2)]
[im 1/23]
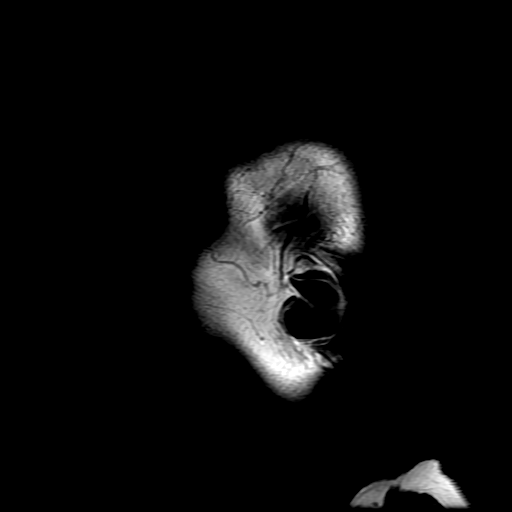
[im 23/23]
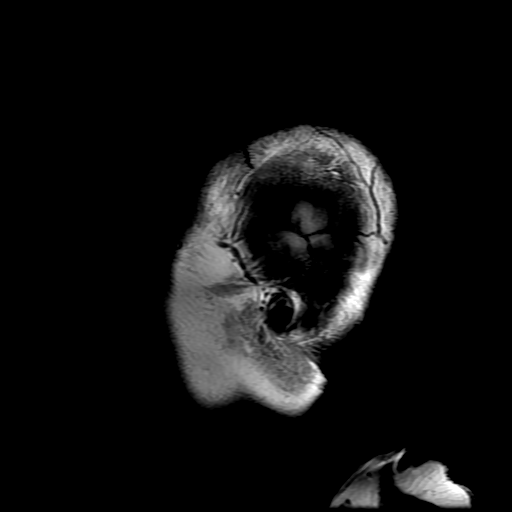

[Series 6: T2 · axial · 5.0mm · 0.43mm/px · z∈[-76,+68]mm · 2 of 25 slices shown (1 of 2)]
[im 1/25]
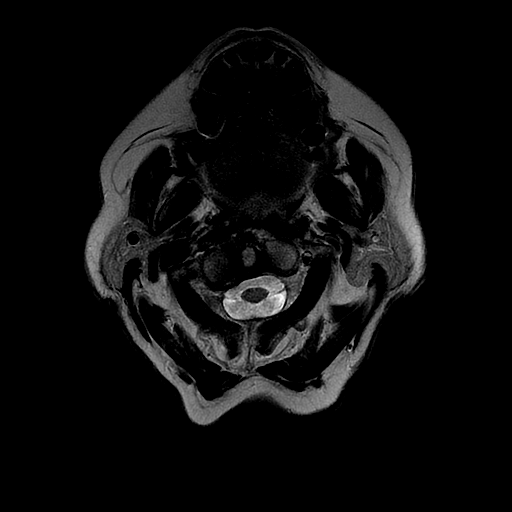
[im 25/25]
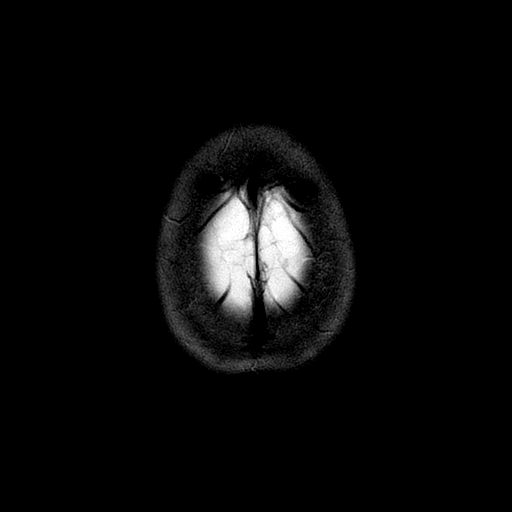

[Series 7: FLAIR · axial · 3.0mm · 0.43mm/px · z∈[-76,+68]mm · 2 of 25 slices shown]
[im 1/25]
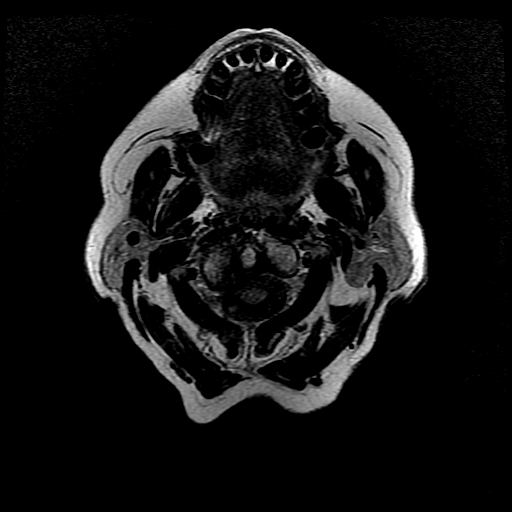
[im 25/25]
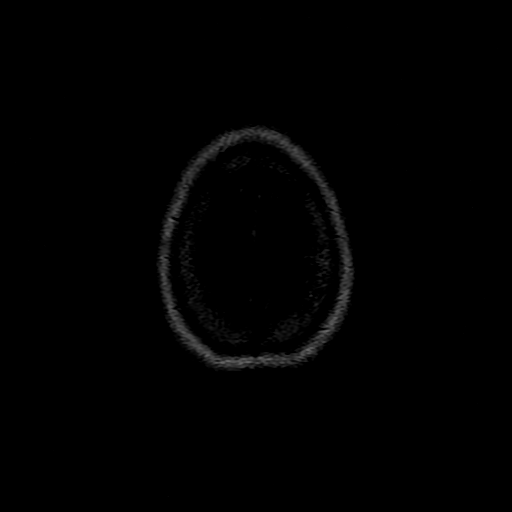

[Series 9: T1 · axial · 3.0mm · 0.43mm/px · z∈[-78,-28]mm · 3 of 100 slices shown (2 of 2)]
[im 1/100]
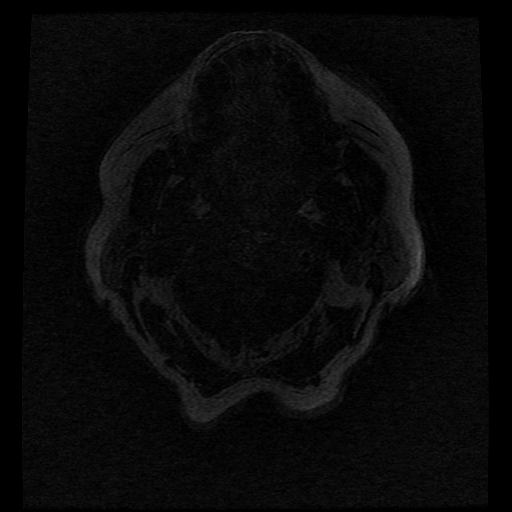
[im 12/100]
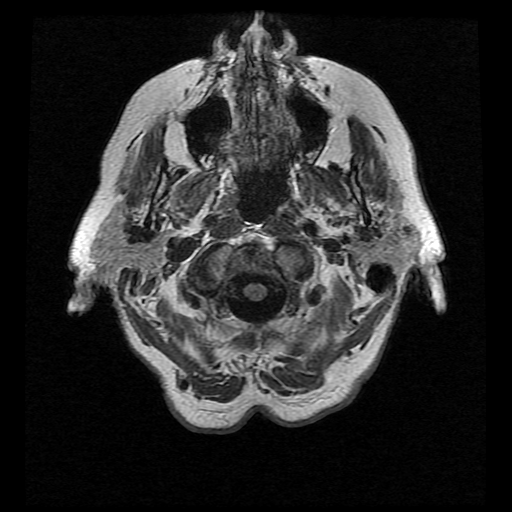
[im 34/100]
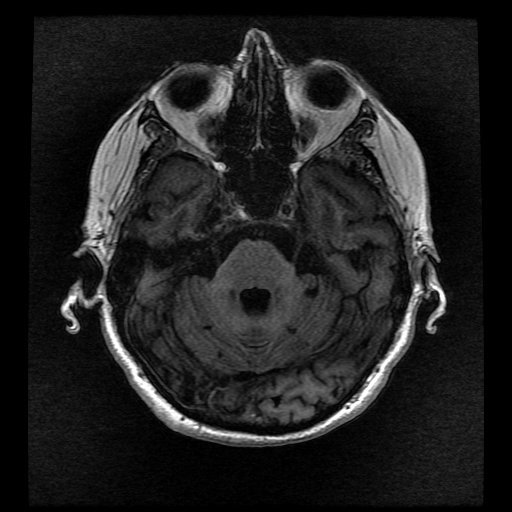

[Series 10: T2 · coronal · 5.0mm · 0.43mm/px · 2 of 24 slices shown (2 of 2)]
[im 1/24]
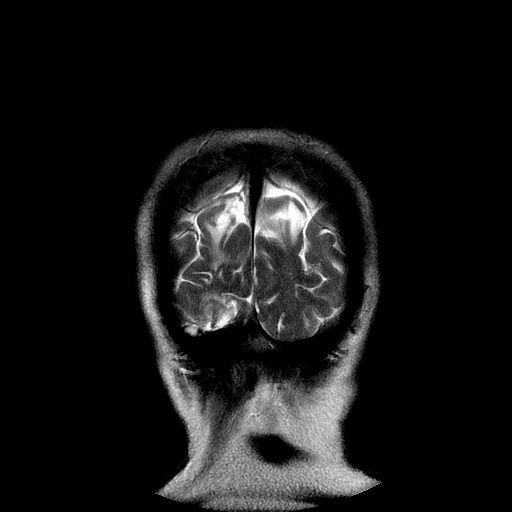
[im 24/24]
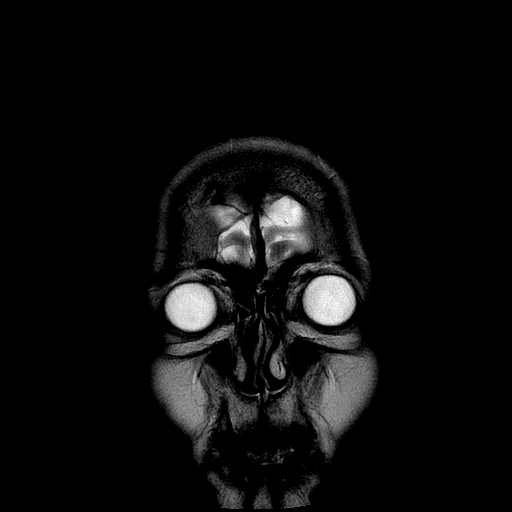

[Series 300: DWI · axial · 3.0mm · 1.09mm/px · z∈[-82,+80]mm · 5 of 55 slices shown (3 of 4)]
[im 1/55]
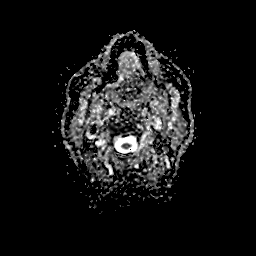
[im 14/55]
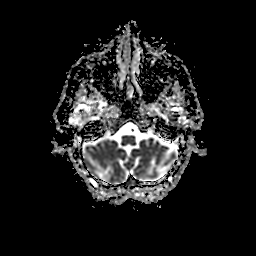
[im 28/55]
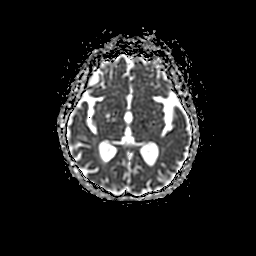
[im 41/55]
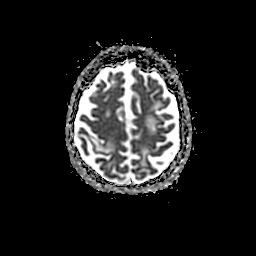
[im 55/55]
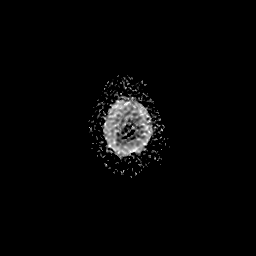

[Series 400: DWI · coronal · 5.0mm · 1.09mm/px · 4 of 36 slices shown (4 of 4)]
[im 1/36]
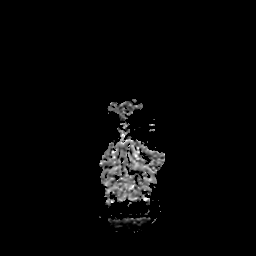
[im 12/36]
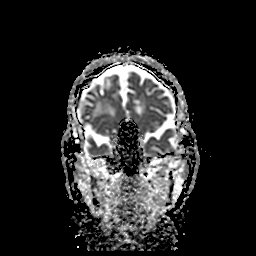
[im 24/36]
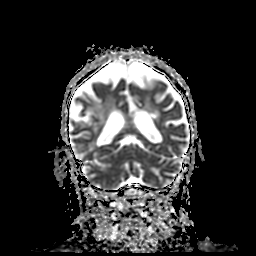
[im 36/36]
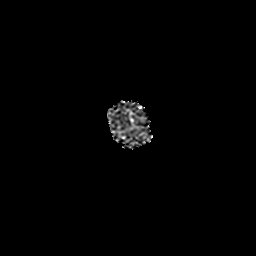

[36 of 48 positions shown; findings below may reference images not displayed]

FINDINGS: Brain: There is a 1.2 cm acute cortical/subcortical infarct in the
posterior right frontal lobe. Small to moderate-sized chronic
infarcts are scattered throughout both cerebral hemispheres
involving both MCA and PCA territories, and there are also numerous
chronic bilateral cerebellar infarcts. Chronic lacunar infarcts are
also noted in the basal ganglia bilaterally, right thalamus, and
pons. Chronic microhemorrhages are present in the right corona
radiata and right cerebellar hemisphere. There is mild cerebral
atrophy. No mass, midline shift, or extra-axial fluid collection is
identified.

Vascular: Major intracranial vascular flow voids are preserved.

Skull and upper cervical spine: Unremarkable bone marrow signal.

Sinuses/Orbits: Bilateral cataract extraction. Mild mucosal
thickening in the maxillary sinuses. Trace bilateral mastoid fluid.

Other: None.
IMPRESSION: 1. Small acute right frontal infarct.
2. Numerous chronic supratentorial and infratentorial infarcts.

## 2021-04-05 MED ORDER — ATORVASTATIN CALCIUM 80 MG PO TABS
80.0000 mg | ORAL_TABLET | Freq: Every day | ORAL | Status: DC
  Administered 2021-04-05 – 2021-04-13 (×9): 80 mg via ORAL
  Filled 2021-04-05 (×9): qty 1

## 2021-04-05 MED ORDER — DIPHENHYDRAMINE HCL 25 MG PO CAPS
50.0000 mg | ORAL_CAPSULE | Freq: Every evening | ORAL | Status: DC | PRN
  Administered 2021-04-05 – 2021-04-11 (×6): 50 mg via ORAL
  Filled 2021-04-05 (×6): qty 2

## 2021-04-05 MED ORDER — ACETAMINOPHEN 325 MG PO TABS
650.0000 mg | ORAL_TABLET | ORAL | Status: DC | PRN
  Administered 2021-04-12: 650 mg via ORAL
  Filled 2021-04-05: qty 2

## 2021-04-05 MED ORDER — ASPIRIN 300 MG RE SUPP: 300.0000 mg | Freq: Every day | RECTAL | Status: DC

## 2021-04-05 MED ORDER — INSULIN DETEMIR 100 UNIT/ML ~~LOC~~ SOLN
30.0000 [IU] | Freq: Every day | SUBCUTANEOUS | Status: DC
  Administered 2021-04-05 – 2021-04-12 (×8): 30 [IU] via SUBCUTANEOUS
  Filled 2021-04-05 (×9): qty 0.3

## 2021-04-05 MED ORDER — LISINOPRIL 10 MG PO TABS
20.0000 mg | ORAL_TABLET | Freq: Every day | ORAL | Status: DC
  Administered 2021-04-05: 20 mg via ORAL
  Filled 2021-04-05: qty 2

## 2021-04-05 MED ORDER — ASPIRIN 325 MG PO TABS: 325.0000 mg | ORAL_TABLET | Freq: Every day | ORAL | Status: DC

## 2021-04-05 MED ORDER — CLOPIDOGREL BISULFATE 75 MG PO TABS
75.0000 mg | ORAL_TABLET | Freq: Every day | ORAL | Status: DC
  Administered 2021-04-05 – 2021-04-13 (×9): 75 mg via ORAL
  Filled 2021-04-05 (×9): qty 1

## 2021-04-05 MED ORDER — INSULIN ASPART 100 UNIT/ML IJ SOLN
0.0000 [IU] | Freq: Three times a day (TID) | INTRAMUSCULAR | Status: DC
Start: 2021-04-05 — End: 2021-04-13
  Administered 2021-04-05: 3 [IU] via SUBCUTANEOUS
  Administered 2021-04-06: 2 [IU] via SUBCUTANEOUS
  Administered 2021-04-06 (×2): 3 [IU] via SUBCUTANEOUS
  Administered 2021-04-07 (×2): 2 [IU] via SUBCUTANEOUS
  Administered 2021-04-07 – 2021-04-08 (×2): 1 [IU] via SUBCUTANEOUS
  Administered 2021-04-09: 2 [IU] via SUBCUTANEOUS
  Administered 2021-04-09: 1 [IU] via SUBCUTANEOUS
  Administered 2021-04-09: 3 [IU] via SUBCUTANEOUS
  Administered 2021-04-10: 2 [IU] via SUBCUTANEOUS
  Administered 2021-04-10: 1 [IU] via SUBCUTANEOUS
  Administered 2021-04-10: 2 [IU] via SUBCUTANEOUS
  Administered 2021-04-11: 1 [IU] via SUBCUTANEOUS
  Administered 2021-04-11: 2 [IU] via SUBCUTANEOUS
  Administered 2021-04-13: 3 [IU] via SUBCUTANEOUS
  Administered 2021-04-13: 5 [IU] via SUBCUTANEOUS

## 2021-04-05 MED ORDER — ACETAMINOPHEN 160 MG/5ML PO SOLN: 650.0000 mg | ORAL | Status: DC | PRN

## 2021-04-05 MED ORDER — STROKE: EARLY STAGES OF RECOVERY BOOK
Freq: Once | Status: AC
  Filled 2021-04-05: qty 1

## 2021-04-05 MED ORDER — ACETAMINOPHEN 650 MG RE SUPP: 650.0000 mg | RECTAL | Status: DC | PRN

## 2021-04-05 MED ORDER — ENOXAPARIN SODIUM 40 MG/0.4ML IJ SOSY
40.0000 mg | PREFILLED_SYRINGE | INTRAMUSCULAR | Status: DC
  Administered 2021-04-05 – 2021-04-13 (×8): 40 mg via SUBCUTANEOUS
  Filled 2021-04-05 (×8): qty 0.4

## 2021-04-05 MED ORDER — METOPROLOL TARTRATE 25 MG PO TABS
25.0000 mg | ORAL_TABLET | Freq: Two times a day (BID) | ORAL | Status: DC
  Administered 2021-04-05 – 2021-04-13 (×17): 25 mg via ORAL
  Filled 2021-04-05 (×17): qty 1

## 2021-04-05 MED ORDER — ASPIRIN EC 81 MG PO TBEC
81.0000 mg | DELAYED_RELEASE_TABLET | Freq: Every day | ORAL | Status: DC
  Administered 2021-04-05 – 2021-04-13 (×9): 81 mg via ORAL
  Filled 2021-04-05 (×9): qty 1

## 2021-04-05 MED ORDER — SODIUM CHLORIDE 0.9 % IV SOLN: INTRAVENOUS | Status: AC

## 2021-04-05 MED ORDER — SERTRALINE HCL 50 MG PO TABS
50.0000 mg | ORAL_TABLET | Freq: Every day | ORAL | Status: DC
  Administered 2021-04-05 – 2021-04-13 (×9): 50 mg via ORAL
  Filled 2021-04-05 (×9): qty 1

## 2021-04-05 NOTE — Plan of Care (Signed)
  Problem: Health Behavior/Discharge Planning: Goal: Ability to manage health-related needs will improve Outcome: Progressing   Problem: Clinical Measurements: Goal: Will remain free from infection Outcome: Progressing   Problem: Education: Goal: Knowledge of secondary prevention will improve Outcome: Progressing

## 2021-04-05 NOTE — ED Provider Notes (Signed)
[facility] 4E CV SURGICAL PROGRESSIVE CARE Provider Note   CSN: [id number] Arrival date & time: 04/04/21  1939     History Chief Complaint  Patient presents with   Aphasia    Erin Baker is a 80 y.o. female.  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   LNW 3PM, arriving at 745PM.  Reports symptoms have improved but are still present, does not feel she is back to normal.  Also had difficulty swallowing, feeling strange feeling on left side of her throat.  Felt left side of face with tingling/numbness but that has resolved now. Speech has significantly improved but still there. Feels swallowing may be better. No chest pain or dyspnea, no visual changes, no focal numbness/weakness to arms or legs.    Past Medical History:  Diagnosis Date   Aortic aneurysm (HCC)    Diabetes mellitus without complication (HCC)    Hypertension    Stroke [facility])     Patient Active Problem List   Diagnosis Date Noted   Dysarthria 04/04/2021    Past Surgical History:  Procedure Laterality Date   APPENDECTOMY     CARDIAC CATHETERIZATION     COLON SURGERY     CORONARY ANGIOPLASTY       OB History   No obstetric history on file.     Family History  Problem Relation Age of Onset   Diabetes type II Granddaughter    CAD Neg Hx     Social History   Tobacco Use   Smoking status: Former    Types: Cigarettes    Passive exposure: Never   Smokeless tobacco: Never  Substance Use Topics   Alcohol use: Never   Drug use: Never    Home Medications Prior to Admission medications   Not on File    Allergies    Morphine  Review of Systems   Review of Systems  Constitutional:   Negative for fever.  HENT:  Negative for sore throat.   Eyes:  Negative for visual disturbance.  Respiratory:  Negative for cough and shortness of breath.   Cardiovascular:  Negative for chest pain.  Gastrointestinal:  Negative for abdominal pain.  Genitourinary:  Negative for difficulty urinating.  Musculoskeletal:  Negative for back pain and neck pain.  Skin:  Negative for rash.  Neurological:  Positive for speech difficulty and numbness. Negative for syncope, facial asymmetry, weakness and headaches.   Physical Exam Updated Vital Signs BP (!) 159/75 (BP Location: Right Arm)   Pulse 68   Temp 97.9 F (36.6 C) (Oral)   Resp 16   Ht  (1.651 m)   Wt 74.9 kg   SpO2 100%   BMI 27.48 kg/m   Physical Exam Vitals and nursing note reviewed.  Constitutional:      General: She is not in acute distress.    Appearance: She is well-developed. She is not diaphoretic.  HENT:     Head: Normocephalic and atraumatic.  Eyes:     Conjunctiva/sclera: Conjunctivae normal.  Cardiovascular:     Rate and Rhythm: Normal rate and regular rhythm.     Heart sounds: Normal heart sounds. No murmur heard.   No friction rub. No gallop.  Pulmonary:     Effort:  Pulmonary effort is normal. No respiratory distress.     Breath sounds: Normal breath sounds. No wheezing or rales.  Abdominal:     General: There is no distension.     Palpations: Abdomen is soft.     Tenderness: There is no abdominal tenderness. There is no guarding.  Musculoskeletal:        General: No tenderness.     Cervical back: Normal range of motion.  Skin:    General: Skin is warm and dry.     Findings: No erythema or rash.  Neurological:     Mental Status: She is alert and oriented to person, place, and time.     Comments: Uvular deviation Tongue deviation to left Dysarthria Occasional Bilateral jerking movements    ED Results / Procedures / Treatments   Labs (all labs ordered are listed, but only abnormal results are  displayed) Labs Reviewed  CBC - Abnormal; Notable for the following components:      Result Value   Hemoglobin 10.7 (*)    HCT 35.0 (*)    MCH 25.4 (*)    RDW 15.9 (*)    All other components within normal limits  COMPREHENSIVE METABOLIC PANEL - Abnormal; Notable for the following components:   Glucose, Bld 168 (*)    BUN 26 (*)    Creatinine, Ser 1.09 (*)    GFR, Estimated 52 (*)    All other components within normal limits  URINALYSIS, ROUTINE W REFLEX MICROSCOPIC - Abnormal; Notable for the following components:   APPearance CLOUDY (*)    Nitrite POSITIVE (*)    All other components within normal limits  CBC WITH DIFFERENTIAL/PLATELET - Abnormal; Notable for the following components:   WBC 10.8 (*)    Hemoglobin 11.9 (*)    RDW 15.9 (*)    All other components within normal limits  URINALYSIS, MICROSCOPIC (REFLEX) - Abnormal; Notable for the following components:   Bacteria, UA MANY (*)    All other components within normal limits  CBG MONITORING, ED - Abnormal; Notable for the following components:   Glucose-Capillary 173 (*)    All other components within normal limits  RESP PANEL BY RT-PCR (FLU A&B, COVID) ARPGX2  ETHANOL  PROTIME-INR  APTT  DIFFERENTIAL  RAPID URINE DRUG SCREEN, HOSP PERFORMED    EKG EKG Interpretation  Date/Time:  Tuesday April 04 2021 20:03:07 EDT Ventricular Rate:  73 PR Interval:  168 QRS Duration: 98 QT Interval:  398 QTC Calculation: 439 R Axis:   -68 Text Interpretation: Sinus rhythm Left anterior fascicular block Probable anterior infarct, age indeterminate Baseline wander in lead(s) I III aVL No previous ECGs available Confirmed by Alvira Monday ([id number]) on 04/05/2021 1:43:07 AM  Radiology CT Angio Head W or Wo Contrast  Result Date: 04/04/2021 CLINICAL DATA:  Initial evaluation for acute stroke. EXAM: CT ANGIOGRAPHY HEAD AND NECK TECHNIQUE: Multidetector CT imaging of the head and neck was performed using the standard protocol  during bolus administration of intravenous contrast. Multiplanar CT image reconstructions and MIPs were obtained to evaluate the vascular anatomy. Carotid stenosis measurements (when applicable) are obtained utilizing NASCET criteria, using the distal internal carotid diameter as the denominator. CONTRAST:  2mL OMNIPAQUE IOHEXOL 350 MG/ML SOLN COMPARISON:  Prior head CT from earlier the same day. 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 Origin of the innominate artery is markedly irregular without high-grade stenosis. 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 Left common to subclavian artery graft in place with perfusion of the left subclavian artery distally. Widely patent flow through the graft. Right carotid system: Right CCA patent to the bifurcation without stenosis. Bulky calcified plaque about the right carotid bulb with associated 50% stenosis by NASCET criteria. Right ICA widely patent distally. Left carotid system: Left CCA patent from its origin to the bifurcation without stenosis. Bulky calcified plaque about the left carotid bulb/proximal left ICA with associated 40% stenosis by NASCET criteria. Left ICA widely patent distally. Vertebral arteries: Both vertebral arteries arise from subclavian arteries. Vertebral arteries widely patent within the neck without stenosis or other abnormality. Skeleton: No visible discrete osseous lesions. Other neck: Postsurgical changes present within the left neck. Upper chest: Cardiomegaly, partially visualized. Review of the MIP images confirms the above findings CTA HEAD FINDINGS Anterior circulation: Petrous segments patent bilaterally. Scattered atheromatous change throughout the carotid siphons with associated moderate multifocal narrowing. A1 segments patent bilaterally. Normal anterior communicating artery complex. Anterior  cerebral arteries widely patent bilaterally. No M1 stenosis or occlusion. Distal MCA branches well perfused and symmetric. Posterior circulation: Both V4 segments patent to the vertebrobasilar junction without stenosis. Both PICA origins patent and normal. Basilar widely patent to its distal aspect without stenosis. Superior cerebellar arteries patent bilaterally. Both PCAs primarily supplied via the basilar and are well perfused to there distal aspects. Venous sinuses: Patent allowing for timing the contrast bolus. Anatomic variants: None significant.  No visible aneurysm. Review of the MIP images confirms the above findings IMPRESSION: 1. Negative CTA for large vessel occlusion. 2. 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 3. 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 Widely patent flow through the graft. 4. 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 5. Cardiomegaly. Electronically Signed   By: Rise Mu M.D.   On: 04/04/2021 22:32   CT HEAD WO CONTRAST  Result Date: 04/04/2021 CLINICAL DATA:  Neuro deficit, acute, stroke suspected EXAM: CT HEAD WITHOUT CONTRAST TECHNIQUE: Contiguous axial images were obtained from the base of the skull through the vertex without intravenous contrast. COMPARISON:  None. FINDINGS: Brain: There is normal anatomic configuration of the brain. Mild parenchymal volume loss is commensurate with the patient's age. Mild periventricular white matter changes are present likely reflecting the sequela of small vessel ischemia. There are areas of encephalomalacia involving the cerebellar hemispheres bilaterally, right temporoparietal cortex, inferior right frontal lobe, and left high frontoparietal cortex  possibly the sequela of remote infarction. Additional lacunar infarcts are noted within the right lentiform nucleus, right caudate head, left corona radiata, right pons, and cerebellum. No definite evidence of acute intracranial infarct. No acute hemorrhage. No abnormal mass effect or midline shift. No intra or extra-axial mass lesion. Ventricular size is normal. Vascular: No asymmetric hyperdense vasculature at the skull base. Extensive atherosclerotic calcification within the carotid siphons. Skull: Intact Sinuses/Orbits: The visualized paranasal sinuses are clear. The orbits are unremarkable. Other: Mastoid air cells and middle ear cavities are clear. IMPRESSION: No acute intracranial abnormality identified. Multiple supratentorial and infratentorial foci of encephalomalacia, likely representing multiple infarcts in both anterior and posterior circulatory territories. This may reflect the sequela of a central embolic source. Mild senescent change. Electronically Signed   By: Lyda Kalata.D.  On: 04/04/2021 20:47   CT Angio Neck W and/or Wo Contrast  Result Date: 04/04/2021 CLINICAL DATA:  Initial evaluation for acute stroke. EXAM: CT ANGIOGRAPHY HEAD AND NECK TECHNIQUE: Multidetector CT imaging of the head and neck was performed using the standard protocol during bolus administration of intravenous contrast. Multiplanar CT image reconstructions and MIPs were obtained to evaluate the vascular anatomy. Carotid stenosis measurements (when applicable) are obtained utilizing NASCET criteria, using the distal internal carotid diameter as the denominator. CONTRAST:  60mL OMNIPAQUE IOHEXOL 350 MG/ML SOLN COMPARISON:  Prior head CT from earlier the same day. 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 Origin of the innominate artery is markedly irregular without high-grade stenosis. Left  subclavian artery occluded at its origin. Left common to subclavian artery graft in place with perfusion of the left subclavian artery distally. Widely patent flow through the graft. Right carotid system: Right CCA patent to the bifurcation without stenosis. Bulky calcified plaque about the right carotid bulb with associated 50% stenosis by NASCET criteria. Right ICA widely patent distally. Left carotid system: Left CCA patent from its origin to the bifurcation without stenosis. Bulky calcified plaque about the left carotid bulb/proximal left ICA with associated 40% stenosis by NASCET criteria. Left ICA widely patent distally. Vertebral arteries: Both vertebral arteries arise from subclavian arteries. Vertebral arteries widely patent within the neck without stenosis or other abnormality. Skeleton: No visible discrete osseous lesions. Other neck: Postsurgical changes present within the left neck. Upper chest: Cardiomegaly, partially visualized. Review of the MIP images confirms the above findings CTA HEAD FINDINGS Anterior circulation: Petrous segments patent bilaterally. Scattered atheromatous change throughout the carotid siphons with associated moderate multifocal narrowing. A1 segments patent bilaterally. Normal anterior communicating artery complex. Anterior cerebral arteries widely patent bilaterally. No M1 stenosis or occlusion. Distal MCA branches well perfused and symmetric. Posterior circulation: Both V4 segments patent to the vertebrobasilar junction without stenosis. Both PICA origins patent and normal. Basilar widely patent to its distal aspect without stenosis. Superior cerebellar arteries patent bilaterally. Both PCAs primarily supplied via the basilar and are well perfused to there distal aspects. Venous sinuses: Patent allowing for timing the contrast bolus. Anatomic variants: None significant.  No visible aneurysm. Review of the MIP images confirms the above findings IMPRESSION: 1. Negative CTA for  large vessel occlusion. 2. 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 3. 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 Widely patent flow through the graft. 4. 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 5. Cardiomegaly. Electronically Signed   By: Rise MuBenjamin  McClintock M.D.   On: 04/04/2021 22:32    Procedures Procedures   Medications Ordered in ED Medications  iohexol (OMNIPAQUE) 350 MG/ML injection 60 mL (60 mLs Intravenous Contrast Given 04/04/21 2055)    ED Course  I have reviewed the triage vital signs and the nursing notes.  Pertinent labs & imaging results that were available during my care of the patient were reviewed by me and considered in my medical decision making (see chart for details).    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  Discussed acute symptoms and exam with Neurology and agreed to calling [facility] Stroke and placing order for CTA given with intervention window and concerning posterior circulation symptoms to evaluate for basilar occlusion.    Evaluated by TeleNeurology who recommended toxic/metabolic evaluation, consider CVA on differential.  She is not a candidate for intervention.  On my evaluation in person, do feel she on arrival had continuing dysarthria.  If her symptoms have improved prior to teleneurology evaluation would consider  TIA as she does not otherwise appear to have encephalopathy and has more focal acute symptoms of dysarthria, dysphagia concerning for CVA or TIA with multiple risk factors.  CTA was completed per initial order I had placed and shows no evidence of acute occlusion, acute intracranial abnormality or ICH, does show multiple stroke. No other electrolyte abnormalities.   Discussed with hospitalist and Dr. Wilford Corner Neurology.  She does have urine bacteria, but her symptoms were acute and focal by my history and exam I have continued concern for CVA/TIA and feel admission is appropriate give multiple risk factors.    Final Clinical Impression(s) / ED Diagnoses Final diagnoses:  Dysarthria    Rx / DC Orders ED Discharge Orders     None        Alvira Monday, MD 04/05/21 [id number]

## 2021-04-05 NOTE — Progress Notes (Signed)
Inpatient Rehab Admissions Coordinator:   Per therapy recommendations, patient was screened for CIR candidacy by Megan Salon, MS, CCC-SLP. At this time, Pt. Is supervision level with transfers and and ADLS and min A with gait. Anticipate she will continue to progress. She does not appear to require the intensity of CIR at this time. I not pursue a rehab consult for this Pt.  [facility] team will follow and place consult if Pt. Does not progress with ambulation. Please contact me with any questions.  Megan Salon, MS, CCC-SLP Rehab Admissions Coordinator  [phone] (celll) [phone] (office)

## 2021-04-05 NOTE — Consult Note (Addendum)
Hospital Consult    Reason for Consult:  stroke Requesting Physician:  Denton Lank MRN #:  [id number]  History of Present Illness: This is a 80 y.o. female who presented to the hospital with with difficulty speaking on the phone with her family and this lasted about 2 hours.  She states that her tongue felt very heavy and she had facial droop.    She has vascular hx significant for TEVAR and is followed by Dr. Vear Clock in [facility].  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  She also has hx of HTN, DM. She has hx of CAD with stenting.  She is on asa/plavix.    She tells me she has hx of several strokes that started about 4 years ago when she found out about her aneurysm.  She states that she did have some facial droop and trouble speaking back then.  She states that she wore a loop recorder but was never told she had afib.  She went to [place] at the [facility] clinic to have her TEVAR and apparently she had a carotid subclavian bypass at that time due to pain in her left arm.  She states that she was transferred from [place] to [facility].  She is now followed in [facility] for her vascular care.   She states she was on Eliquis in the past for her stroke but had to be taken off of this for bleeding.   The pt is on a statin for cholesterol management.  The pt is on a daily aspirin.   Other AC:  Plavix/Lovnex The pt is on BB, ACEI for hypertension.   The pt is diabetic.   Tobacco hx:  former-quit smoking after TEVAR repair.  Hx of smoking 30 years prior to that  Past Medical History:  Diagnosis Date   Aortic aneurysm (HCC)    Diabetes mellitus without complication (HCC)    Hypertension    Stroke [facility])     Past Surgical History:  Procedure Laterality Date   APPENDECTOMY     CARDIAC CATHETERIZATION     COLON SURGERY     CORONARY ANGIOPLASTY      Allergies  Allergen Reactions   Oxycodone-Acetaminophen Nausea And Vomiting and Shortness Of Breath   Macrobid [Nitrofurantoin]  Swelling   Morphine Other (See Comments)    Hyper, Confusion   Sulfa Antibiotics Swelling   Tramadol Other (See Comments)    hyper   Latex Rash   Levofloxacin Swelling and Rash    Prior to Admission medications   Medication Sig Start Date End Date Taking? Authorizing Provider  aspirin 81 MG chewable tablet Chew 81 mg by mouth daily. 08/05/18  Yes [provider]  atorvastatin (LIPITOR) 80 MG tablet Take 80 mg by mouth daily. 11/04/20  Yes [provider]  clopidogrel (PLAVIX) 75 MG tablet Take 75 mg by mouth daily. 01/02/21  Yes [provider]  diphenhydrAMINE (BENADRYL) 25 MG tablet Take 50 mg by mouth at bedtime as needed for sleep.   Yes [provider]  ergocalciferol (VITAMIN D2) 1.25 MG (50000 UT) capsule Take 50,000 Units by mouth once a week. 01/25/21 04/25/21 Yes [provider]  furosemide (LASIX) 20 MG tablet Take 20 mg by mouth daily as needed for edema. 11/24/19  Yes [provider]  glipiZIDE (GLUCOTROL) 5 MG tablet Take 5 mg by mouth 2 (two) times daily. 02/10/20  Yes [provider]  insulin detemir (LEVEMIR FLEXTOUCH) 100 UNIT/ML FlexPen Inject 25  Units into the skin daily. 11/03/20  Yes [provider]  levothyroxine (SYNTHROID) 100 MCG tablet Take 100 mcg by mouth daily. 12/22/19 02/23/22 Yes [provider]  lisinopril (ZESTRIL) 20 MG tablet Take 20 mg by mouth daily. 01/02/21  Yes [provider]  metoprolol tartrate (LOPRESSOR) 25 MG tablet Take 25 mg by mouth 2 (two) times daily. 01/02/21  Yes [provider]  pantoprazole (PROTONIX) 40 MG tablet Take 1 tablet by mouth daily. 02/08/20  Yes [provider]  sertraline (ZOLOFT) 50 MG tablet Take 50 mg by mouth daily. 08/02/20  Yes [provider]    Social History   Socioeconomic History   Marital status: Single    Spouse name: Not on file   Number of children: Not on file   Years of education: Not on file   Highest  education level: Not on file  Occupational History   Not on file  Tobacco Use   Smoking status: Former    Types: Cigarettes    Passive exposure: Never   Smokeless tobacco: Never  Substance and Sexual Activity   Alcohol use: Never   Drug use: Never   Sexual activity: Not on file  Other Topics Concern   Not on file  Social History Narrative   Not on file   Social Determinants of Health   Financial Resource Strain: Not on file  Food Insecurity: Not on file  Transportation Needs: Not on file  Physical Activity: Not on file  Stress: Not on file  Social Connections: Not on file  Intimate Partner Violence: Not on file     Family History  Problem Relation Age of Onset   Diabetes type II Granddaughter    CAD Neg Hx     ROS: [x]  Positive   [ ]  Negative   [ ]  All sytems reviewed and are negative  Cardiac: []  chest pain/pressure []  palpitations []  SOB lying flat []  DOE  Vascular: []  pain in legs while walking []  pain in legs at rest []  pain in legs at night []  non-healing ulcers []  hx of DVT []  swelling in legs  Pulmonary: []  asthma/wheezing []  home O2  Neurologic: [x]  hx of CVA    Hematologic: []  hx of cancer  Endocrine:   []  diabetes []  thyroid disease  GI []  GERD  GU: []  CKD/renal failure []  HD--[]  M/W/F or []  T/T/S  Psychiatric: []  anxiety []  depression  Musculoskeletal: []  arthritis []  joint pain  Integumentary: []  rashes []  ulcers  Constitutional: []  fever  []  chills  Physical Examination  Vitals:   04/05/21 1000 04/05/21 1300  BP: (!) 145/80 102/77  Pulse: 65 66  Resp: 20 19  Temp: 97.7 F (36.5 C) 97.6 F (36.4 C)  SpO2: 98% 94%   Body mass index is 27.48 kg/m.  General:  WDWN in NAD Gait: Not observed HENT: WNL, normocephalic Pulmonary: normal non-labored breathing Cardiac: regular Abdomen:  soft, NT/ND; aortic pulse is not palpable Skin: without rashes Vascular Exam/Pulses:  Right Left  Radial 2+ (normal) 2+  (normal)  Popliteal Unable to palpate Sjrpal  DP 2+ (normal) 2+ (normal)   Extremities: without ischemic changes, without Gangrene , without cellulitis; without open wounds Musculoskeletal: no muscle wasting or atrophy  Neurologic: A&O X 3; speech is fluent/normal Psychiatric:  The pt has Normal affect.   CBC    Component Value Date/Time   WBC 9.1 04/05/2021 0011   RBC 4.22 04/05/2021 0011   HGB 10.7 (L) 04/05/2021 0011  HCT 35.0 (L) 04/05/2021 0011   PLT 231 04/05/2021 0011   MCV 82.9 04/05/2021 0011   MCH 25.4 (L) 04/05/2021 0011   MCHC 30.6 04/05/2021 0011   RDW 15.9 (H) 04/05/2021 0011   LYMPHSABS 1.9 04/05/2021 0011   MONOABS 0.9 04/05/2021 0011   EOSABS 0.2 04/05/2021 0011   BASOSABS 0.0 04/05/2021 0011    BMET    Component Value Date/Time   NA 136 04/04/2021 1953   K 4.0 04/04/2021 1953   CL 101 04/04/2021 1953   CO2 27 04/04/2021 1953   GLUCOSE 168 (H) 04/04/2021 1953   BUN 26 (H) 04/04/2021 1953   CREATININE 0.98 04/05/2021 0417   CALCIUM 9.4 04/04/2021 1953   GFRNONAA 59 (L) 04/05/2021 0417    COAGS: Lab Results  Component Value Date   INR 1.1 04/04/2021     Non-Invasive Vascular Imaging:   CTA head/neck 04/04/2021 IMPRESSION: 1. Negative CTA for large vessel occlusion. 2. 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 3. 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 Widely patent flow through the graft. 4. 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 5. Cardiomegaly.   ASSESSMENT/PLAN: This is a 80 y.o. female admitted with stroke and has vascular hx significant for TEVAR and left carotid artery to subclavian artery bypass ~ 4 years ago as well as hx of CAD with previous cardiac  stenting.    -pt speech improved but still occasional difficulty finding her words.   -CT scan shows ~ 50% right ICA stenosis and 40% left ICA stenosis.  Dr. Chestine Spore to review CT scan and make further recommendations.   -continue asa/statin/plavix   [name], PA-C [facility] [phone]  I have seen and evaluated the patient. I agree with the PA note as documented above.  80 year old female that vascular surgery has been consulted for evaluation of symptomatic right carotid stenosis.  She apparently had an event yesterday with slurred speech and presented for further evaluation. MRI showed small acute right frontal infarct.  She states she has had at least 5 strokes in the past the last one being about 3 years ago.  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  CTA neck for further work-up suggests about 50% right carotid stenosis at the bifurcation.  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  If you look at her imaging she does have bovine arch where the left carotid comes off the innominate and she has a type III arch as well.  She denies any previous cardiac surgery that would suggest that she has had any additional debranching of her arch in the past.  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  We will order carotid ultrasound to further evaluate degree of stenosis at carotid bifurcation and vascular will follow.  Current guidelines are carotid revascularization for greater than 50% stenosis if we feel this is the etiology or likely etiology.  Would continue aspirin  Plavix statin from my standpoint.  I have discussed with Dr. Roda ShuttersXu  with the stroke service and she has a loop recorder that was evaluated with no evidence of A. fib or other arrhythmia over the last 3 years.  He has tried to review her records as well and unclear etiology for her previous strokes.  Her vascular surgeon is Dr. Clyde CanterburyFrank Arko is [place] and [facility].  Cephus Shellinghristopher J. Nishi Neiswonger, MD [facility] Office: [phone]

## 2021-04-05 NOTE — Consult Note (Addendum)
Stroke Neurology Consultation Note  Consult Requested by: Dr. Lacie Scotts  Reason for Consult: Stroke symptoms: Dysarthria   Consult Date:  04/05/21 History of Present Illness:   The history was obtained from the patient.   Today Mrs. Faulconer describes an hour and half of dysarthria which onset yesterday afternoon. Her tongue felt thick and her left face felt thick and heavy. She called her sister who could not understand anything she was saying, then her daughter who advised her she needed to report to ER. She then was seen as tele neuro evaluation by Dr. Cheral Bay by which time her symptoms had largely resolved. She was subsequently transferred to [facility] for further neurologic evaluation and care.  Today she report very little residual dysarthria and minimal facial left sided heaviness. She is feeling much better overall. We discussed her ongoing work up and plan of care. She is agreeable to vascular surgery consulte.   Per telemedicine note: Last Known Well: 04/04/2021 15:00:00 [employer] Notification Time: 04/04/2021 20:19:19 Arrival Time: 04/04/2021 19:39:00 Stamp Time: 04/04/2021 20:19:36 Initial Response Time: 04/04/2021 20:21:25 Symptoms: Dysarthria. NIHSS Start Assessment Time: 04/04/2021 91:50:56 Patient is not a candidate for Thrombolytic. Thrombolytic Medical Decision: 04/04/2021 20:27:00 Patient was not deemed candidate for Thrombolytic because of following reasons: Last Well Known Above 4.5 Hours. Resolved symptoms (no residual disabling symptoms  Past Medical History:  Diagnosis Date   Aortic aneurysm (HCC)    Diabetes mellitus without complication (HCC)    Hypertension    Stroke [facility])      Past Surgical History:  Procedure Laterality Date   APPENDECTOMY     CARDIAC CATHETERIZATION     COLON SURGERY     CORONARY ANGIOPLASTY      Family History  Problem Relation Age of Onset   Diabetes type II Granddaughter    CAD Neg Hx      Social History:   reports that she has quit smoking. Her smoking use included cigarettes. She has never been exposed to tobacco smoke. She has never used smokeless tobacco. She reports that she does not drink alcohol and does not use drugs.  Review of Systems: A full ROS was attempted today and was  able to be performed.  Systems assessed include - Constitutional, Eyes, HENT, Respiratory, Cardiovascular, Gastrointestinal, Genitourinary, Integument/breast, Hematologic/lymphatic, Musculoskeletal, Neurological, Behavioral/Psych, Endocrine, Allergic/Immunologic - with pertinent responses as per HPI.  Allergies:  Allergies  Allergen Reactions   Oxycodone-Acetaminophen Nausea And Vomiting and Shortness Of Breath   Macrobid [Nitrofurantoin] Swelling   Morphine Other (See Comments)    Hyper, Confusion   Sulfa Antibiotics Swelling   Tramadol Other (See Comments)    hyper   Latex Rash   Levofloxacin Swelling and Rash     Medications: Reviewed   Test Results: CBC:  Recent Labs  Lab 04/04/21 1953 04/05/21 0011  WBC 10.8* 9.1  NEUTROABS 7.1 6.0  HGB 11.9* 10.7*  HCT 37.6 35.0*  MCV 83.2 82.9  PLT 274 231   Basic Metabolic Panel:  Recent Labs  Lab 04/04/21 1953 04/05/21 0417  NA 136  --   K 4.0  --   CL 101  --   CO2 27  --   GLUCOSE 168*  --   BUN 26*  --   CREATININE 1.09* 0.98  CALCIUM 9.4  --    Liver Function Tests: Recent Labs  Lab 04/04/21 1953  AST 17  ALT 10  ALKPHOS 96  BILITOT 0.7  PROT 6.8  ALBUMIN 3.6  No results for input(s): LIPASE, AMYLASE in the last 168 hours. No results for input(s): AMMONIA in the last 168 hours. Coagulation Studies:  Recent Labs    04/04/21 1953  LABPROT 14.5  INR 1.1   Cardiac Enzymes: No results for input(s): CKTOTAL, CKMB, CKMBINDEX, TROPONINI in the last 168 hours. BNP: Invalid input(s): POCBNP CBG:  Recent Labs  Lab 04/04/21 1948 04/05/21 0837 04/05/21 1307 04/05/21 1723  GLUCAP 173* 228* 143* 136*   Urinalysis:  Recent Labs   Lab 04/04/21 2252  COLORURINE YELLOW  LABSPEC 1.020  PHURINE 7.0  GLUCOSEU NEGATIVE  HGBUR NEGATIVE  BILIRUBINUR NEGATIVE  KETONESUR NEGATIVE  PROTEINUR NEGATIVE  NITRITE POSITIVE*  LEUKOCYTESUR NEGATIVE   Microbiology:  Results for orders placed or performed during the hospital encounter of 04/04/21  Resp Panel by RT-PCR (Flu A&B, Covid) Nasopharyngeal Swab     Status: None   Collection Time: 04/04/21  7:53 PM   Specimen: Nasopharyngeal Swab; Nasopharyngeal(NP) swabs in vial transport medium  Result Value Ref Range Status   SARS Coronavirus 2 by RT PCR NEGATIVE NEGATIVE Final    Comment: (NOTE) SARS-CoV-2 target nucleic acids are NOT DETECTED.  The SARS-CoV-2 RNA is generally detectable in upper respiratory specimens during the acute phase of infection. The lowest concentration of SARS-CoV-2 viral copies this assay can detect is 138 copies/mL. A negative result does not preclude SARS-Cov-2 infection and should not be used as the sole basis for treatment or other patient management decisions. A negative result may occur with  improper specimen collection/handling, submission of specimen other than nasopharyngeal swab, presence of viral mutation(s) within the areas targeted by this assay, and inadequate number of viral copies(<138 copies/mL). A negative result must be combined with clinical observations, patient history, and epidemiological information. The expected result is Negative.  Fact Sheet for Patients:  BloggerCourse.com  Fact Sheet for Healthcare Providers:  SeriousBroker.it  This test is no t yet approved or cleared by the [place] FDA and  has been authorized for detection and/or diagnosis of SARS-CoV-2 by FDA under an Emergency Use Authorization (EUA). This EUA will remain  in effect (meaning this test can be used) for the duration of the COVID-19 declaration under Section 564(b)(1) of the Act,  21 U.S.C.section 360bbb-3(b)(1), unless the authorization is terminated  or revoked sooner.       Influenza A by PCR NEGATIVE NEGATIVE Final   Influenza B by PCR NEGATIVE NEGATIVE Final    Comment: (NOTE) The Xpert Xpress SARS-CoV-2/FLU/RSV plus assay is intended as an aid in the diagnosis of influenza from Nasopharyngeal swab specimens and should not be used as a sole basis for treatment. Nasal washings and aspirates are unacceptable for Xpert Xpress SARS-CoV-2/FLU/RSV testing.  Fact Sheet for Patients: BloggerCourse.com  Fact Sheet for Healthcare Providers: SeriousBroker.it  This test is not yet approved or cleared by the [place] FDA and has been authorized for detection and/or diagnosis of SARS-CoV-2 by FDA under an Emergency Use Authorization (EUA). This EUA will remain in effect (meaning this test can be used) for the duration of the COVID-19 declaration under Section 564(b)(1) of the Act, 21 U.S.C. section 360bbb-3(b)(1), unless the authorization is terminated or revoked.  Performed at [facility], [address] [place], [place] [address]    Lipid Panel:     Component Value Date/Time   CHOL 137 04/05/2021 0417   TRIG 118 04/05/2021 0417   HDL 34 (L) 04/05/2021 0417   CHOLHDL 4.0 04/05/2021 0417  VLDL 24 04/05/2021 0417   LDLCALC 79 04/05/2021 0417   HgbA1c:  Lab Results  Component Value Date   HGBA1C 7.7 (H) 04/05/2021   Urine Drug Screen:     Component Value Date/Time   LABOPIA NONE DETECTED 04/04/2021 2252   COCAINSCRNUR NONE DETECTED 04/04/2021 2252   LABBENZ NONE DETECTED 04/04/2021 2252   AMPHETMU NONE DETECTED 04/04/2021 2252   THCU NONE DETECTED 04/04/2021 2252   LABBARB NONE DETECTED 04/04/2021 2252    Alcohol Level:  Recent Labs  Lab 04/04/21 1953  ETH <10    CT Angio Head W or Wo Contrast  Result Date: 04/04/2021 CLINICAL DATA:  Initial evaluation for acute stroke.  EXAM: CT ANGIOGRAPHY HEAD AND NECK TECHNIQUE: Multidetector CT imaging of the head and neck was performed using the standard protocol during bolus administration of intravenous contrast. Multiplanar CT image reconstructions and MIPs were obtained to evaluate the vascular anatomy. Carotid stenosis measurements (when applicable) are obtained utilizing NASCET criteria, using the distal internal carotid diameter as the denominator. CONTRAST:  60mL OMNIPAQUE IOHEXOL 350 MG/ML SOLN COMPARISON:  Prior head CT from earlier the same day. 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 Origin of the innominate artery is markedly irregular without high-grade stenosis. 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 Left common to subclavian artery graft in place with perfusion of the left subclavian artery distally. Widely patent flow through the graft. Right carotid system: Right CCA patent to the bifurcation without stenosis. Bulky calcified plaque about the right carotid bulb with associated 50% stenosis by NASCET criteria. Right ICA widely patent distally. Left carotid system: Left CCA patent from its origin to the bifurcation without stenosis. Bulky calcified plaque about the left carotid bulb/proximal left ICA with associated 40% stenosis by NASCET criteria. Left ICA widely patent distally. Vertebral arteries: Both vertebral arteries arise from subclavian arteries. Vertebral arteries widely patent within the neck without stenosis or other abnormality. Skeleton: No visible discrete osseous lesions. Other neck: Postsurgical changes present within the left neck. Upper chest: Cardiomegaly, partially visualized. Review of the MIP images confirms the above findings CTA HEAD FINDINGS Anterior circulation: Petrous segments patent bilaterally. Scattered atheromatous change throughout the carotid siphons with  associated moderate multifocal narrowing. A1 segments patent bilaterally. Normal anterior communicating artery complex. Anterior cerebral arteries widely patent bilaterally. No M1 stenosis or occlusion. Distal MCA branches well perfused and symmetric. Posterior circulation: Both V4 segments patent to the vertebrobasilar junction without stenosis. Both PICA origins patent and normal. Basilar widely patent to its distal aspect without stenosis. Superior cerebellar arteries patent bilaterally. Both PCAs primarily supplied via the basilar and are well perfused to there distal aspects. Venous sinuses: Patent allowing for timing the contrast bolus. Anatomic variants: None significant.  No visible aneurysm. Review of the MIP images confirms the above findings IMPRESSION: 1. Negative CTA for large vessel occlusion. 2. 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 3. 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 Widely patent flow through the graft. 4. 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 5. Cardiomegaly. Electronically Signed   By: Rise Mu M.D.   On: 04/04/2021 22:32   CT HEAD WO CONTRAST  Result Date: 04/04/2021  CLINICAL DATA:  Neuro deficit, acute, stroke suspected EXAM: CT HEAD WITHOUT CONTRAST TECHNIQUE: Contiguous axial images were obtained from the base of the skull through the vertex without intravenous contrast. COMPARISON:  None. FINDINGS: Brain: There is normal anatomic configuration of the brain. Mild parenchymal volume loss is commensurate with the patient's age. Mild periventricular white matter changes are present likely reflecting the sequela of small vessel ischemia. There are areas of encephalomalacia involving the  cerebellar hemispheres bilaterally, right temporoparietal cortex, inferior right frontal lobe, and left high frontoparietal cortex possibly the sequela of remote infarction. Additional lacunar infarcts are noted within the right lentiform nucleus, right caudate head, left corona radiata, right pons, and cerebellum. No definite evidence of acute intracranial infarct. No acute hemorrhage. No abnormal mass effect or midline shift. No intra or extra-axial mass lesion. Ventricular size is normal. Vascular: No asymmetric hyperdense vasculature at the skull base. Extensive atherosclerotic calcification within the carotid siphons. Skull: Intact Sinuses/Orbits: The visualized paranasal sinuses are clear. The orbits are unremarkable. Other: Mastoid air cells and middle ear cavities are clear. IMPRESSION: No acute intracranial abnormality identified. Multiple supratentorial and infratentorial foci of encephalomalacia, likely representing multiple infarcts in both anterior and posterior circulatory territories. This may reflect the sequela of a central embolic source. Mild senescent change. Electronically Signed   By: Helyn Numbers M.D.   On: 04/04/2021 20:47   CT Angio Neck W and/or Wo Contrast  Result Date: 04/04/2021 CLINICAL DATA:  Initial evaluation for acute stroke. EXAM: CT ANGIOGRAPHY HEAD AND NECK TECHNIQUE: Multidetector CT imaging of the head and neck was performed using the standard protocol during bolus administration of intravenous contrast. Multiplanar CT image reconstructions and MIPs were obtained to evaluate the vascular anatomy. Carotid stenosis measurements (when applicable) are obtained utilizing NASCET criteria, using the distal internal carotid diameter as the denominator. CONTRAST:  63mL OMNIPAQUE IOHEXOL 350 MG/ML SOLN COMPARISON:  Prior head CT from earlier the same day. 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 Origin of the innominate artery is markedly irregular without high-grade stenosis. 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 Left common to subclavian artery graft in place with perfusion of the left subclavian artery distally. Widely patent flow through the graft. Right carotid system: Right CCA patent to the bifurcation without stenosis. Bulky calcified plaque about the right carotid bulb with associated 50% stenosis by NASCET criteria. Right ICA widely patent distally. Left carotid system: Left CCA patent from its origin to the bifurcation without stenosis. Bulky calcified plaque about the left carotid bulb/proximal left ICA with associated 40% stenosis by NASCET criteria. Left ICA widely patent distally. Vertebral arteries: Both vertebral arteries arise from subclavian arteries. Vertebral arteries widely patent within the neck without stenosis or other abnormality. Skeleton: No visible discrete osseous lesions. Other neck: Postsurgical changes present within the left neck. Upper chest: Cardiomegaly, partially visualized. Review of the MIP images confirms the above findings CTA HEAD FINDINGS Anterior circulation: Petrous segments patent bilaterally. Scattered atheromatous change throughout the carotid siphons with associated moderate multifocal narrowing. A1 segments patent bilaterally. Normal anterior communicating artery complex. Anterior cerebral arteries widely patent bilaterally. No M1 stenosis or occlusion. Distal MCA branches well perfused and symmetric. Posterior circulation: Both V4 segments patent to the vertebrobasilar junction without stenosis. Both PICA origins patent and normal. Basilar widely patent to its distal aspect without stenosis. Superior cerebellar arteries patent bilaterally. Both PCAs primarily supplied via the basilar  and are well perfused to there distal aspects. Venous sinuses: Patent allowing for timing the contrast bolus. Anatomic  variants: None significant.  No visible aneurysm. Review of the MIP images confirms the above findings IMPRESSION: 1. Negative CTA for large vessel occlusion. 2. 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 3. 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 Widely patent flow through the graft. 4. 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 5. Cardiomegaly. Electronically Signed   By: Rise Mu M.D.   On: 04/04/2021 22:32   MR BRAIN WO CONTRAST  Result Date: 04/05/2021 CLINICAL DATA:  Neuro deficit, acute, stroke suspected. EXAM: MRI HEAD WITHOUT CONTRAST TECHNIQUE: Multiplanar, multiecho pulse sequences of the brain and surrounding structures were obtained without intravenous contrast. COMPARISON:  Head CT 04/04/2021 FINDINGS: Brain: There is a 1.2 cm acute cortical/subcortical infarct in the posterior right frontal lobe. Small to moderate-sized chronic infarcts are scattered throughout both cerebral hemispheres involving both MCA and PCA territories, and there are also numerous chronic bilateral cerebellar infarcts. Chronic lacunar infarcts are also noted in the basal ganglia bilaterally, right thalamus, and pons. Chronic microhemorrhages are present in the right corona radiata and right cerebellar hemisphere. There is mild cerebral atrophy. No mass, midline shift, or extra-axial fluid collection is identified. Vascular: Major intracranial vascular flow voids are preserved. Skull and upper cervical spine: Unremarkable bone marrow signal. Sinuses/Orbits: Bilateral cataract extraction. Mild mucosal thickening in the maxillary sinuses. Trace bilateral mastoid fluid. Other: None. IMPRESSION: 1. Small acute right frontal infarct. 2.  Numerous chronic supratentorial and infratentorial infarcts. Electronically Signed   By: Sebastian Ache M.D.   On: 04/05/2021 12:57   DG CHEST PORT 1 VIEW  Result Date: 04/05/2021 CLINICAL DATA:  Pre MRI. Metal in chest. History of a stroke. EXAM: PORTABLE CHEST 1 VIEW COMPARISON:  None. FINDINGS: Thoracic aortic stent graft noted. There also multiple vascular clips in left supraclavicular region. A loop recorder is noted on left side. The heart is mildly enlarged. Elevation of the left hemidiaphragm. No acute pulmonary findings. The bony thorax is intact. IMPRESSION: 1. Thoracic aortic stent graft noted. 2. No acute cardiopulmonary findings. Electronically Signed   By: Rudie Meyer M.D.   On: 04/05/2021 06:50   ECHOCARDIOGRAM COMPLETE  Result Date: 04/05/2021    ECHOCARDIOGRAM REPORT   Patient Name:   RAIMA GEATHERS Date of Exam: 04/05/2021 Medical Rec #:  [id number]    Height:       65.0 in Accession #:    [id number]   Weight:       165.1 lb Date of Birth:  07-19-1941    BSA:          1.823 m Patient Age:    53 years     BP:           102/77 mmHg Patient Gender: F            HR:           66 bpm. Exam Location:  Inpatient Procedure: 3D Echo, 2D Echo, Cardiac Doppler and Color Doppler Indications:    Stroke  History:        Patient has no prior history of Echocardiogram examinations.  CAD, Stroke; Risk Factors:Diabetes and Current Smoker.  Sonographer:    Sheralyn Boatmanina West RDCS Referring Phys: [phone] Meryle ReadyRSHAD N [facility] Center[facility]AKRA[facility]ANDY  Sonographer Comments: Technically difficult study due to poor echo windows. IMPRESSIONS  1. Left ventricular ejection fraction, by estimation, is 60 to 65%. The left ventricle has normal function. The left ventricle has no regional wall motion abnormalities. There is moderate asymmetric left ventricular hypertrophy of the posterior segment.  Left ventricular diastolic parameters are consistent with Grade I diastolic dysfunction (impaired relaxation).  2. Right ventricular systolic function is  normal. The right ventricular size is normal.  3. The mitral valve is normal in structure. No evidence of mitral valve regurgitation. No evidence of mitral stenosis.  4. The aortic valve is normal in structure. Aortic valve regurgitation is not visualized. No aortic stenosis is present.  5. Aortic dilatation noted. Aneurysm of the ascending aorta, measuring 50 mm. There is borderline dilatation of the aortic root, measuring 37 mm. There is multilobular protruding plaque involving the ascending aorta and descending aorta.  6. The inferior vena cava is normal in size with greater than 50% respiratory variability, suggesting right atrial pressure of 3 mmHg.  7. Recommend Chest and Abdominal CTA for further assessment of aortic aneurysm as well as protruding plaque in the thoracic aorta which may be a source of stroke. FINDINGS  Left Ventricle: Left ventricular ejection fraction, by estimation, is 60 to 65%. The left ventricle has normal function. The left ventricle has no regional wall motion abnormalities. The left ventricular internal cavity size was normal in size. There is  moderate asymmetric left ventricular hypertrophy of the posterior segment. Left ventricular diastolic parameters are consistent with Grade I diastolic dysfunction (impaired relaxation). Normal left ventricular filling pressure. Right Ventricle: The right ventricular size is normal. No increase in right ventricular wall thickness. Right ventricular systolic function is normal. Left Atrium: Left atrial size was normal in size. Right Atrium: Right atrial size was normal in size. Pericardium: There is no evidence of pericardial effusion. Mitral Valve: The mitral valve is normal in structure. No evidence of mitral valve regurgitation. No evidence of mitral valve stenosis. Tricuspid Valve: The tricuspid valve is normal in structure. Tricuspid valve regurgitation is not demonstrated. No evidence of tricuspid stenosis. Aortic Valve: The aortic valve is  normal in structure. Aortic valve regurgitation is not visualized. No aortic stenosis is present. Pulmonic Valve: The pulmonic valve was normal in structure. Pulmonic valve regurgitation is trivial. No evidence of pulmonic stenosis. Aorta: Aortic dilatation noted. There is borderline dilatation of the aortic root, measuring 37 mm. 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 There is multilobular plaque involving the ascending aorta and descending aorta. Venous: The inferior vena cava is normal in size with greater than 50% respiratory variability, suggesting right atrial pressure of 3 mmHg. IAS/Shunts: No atrial level shunt detected by color flow Doppler.  LEFT VENTRICLE PLAX 2D LVIDd:         4.10 cm     Diastology LVIDs:         3.00 cm     LV e' medial:    8.70 cm/s LV PW:         1.40 cm     LV E/e' medial:  6.5 LV IVS:        1.10 cm     LV e' lateral:   6.74 cm/s LVOT diam:     2.00 cm     LV E/e' lateral: 8.4 LV SV:  50 LV SV Index:   28 LVOT Area:     3.14 cm  LV Volumes (MOD) LV vol d, MOD A2C: 61.4 ml LV vol d, MOD A4C: 56.3 ml LV vol s, MOD A2C: 28.6 ml LV vol s, MOD A4C: 21.9 ml LV SV MOD A2C:     32.8 ml LV SV MOD A4C:     56.3 ml LV SV MOD BP:      33.8 ml RIGHT VENTRICLE            IVC RV S prime:     7.94 cm/s  IVC diam: 2.30 cm TAPSE (M-mode): 1.2 cm LEFT ATRIUM             Index       RIGHT ATRIUM          Index LA diam:        3.10 cm 1.70 cm/m  RA Area:     7.24 cm LA Vol (A2C):   18.6 ml 10.20 ml/m RA Volume:   12.10 ml 6.64 ml/m LA Vol (A4C):   20.2 ml 11.08 ml/m LA Biplane Vol: 19.5 ml 10.69 ml/m  AORTIC VALVE LVOT Vmax:   63.20 cm/s LVOT Vmean:  42.300 cm/s LVOT VTI:    0.160 m  AORTA Ao Root diam: 3.70 cm Ao Asc diam:  4.25 cm MITRAL VALVE MV Area (PHT): 2.45 cm    SHUNTS MV Decel Time: 310 msec    Systemic VTI:  0.16 m MV E velocity: 56.60 cm/s  Systemic Diam: 2.00 cm MV A velocity: 80.70 cm/s MV E/A ratio:  0.70 Armanda Magic MD Electronically signed  by Armanda Magic MD Signature Date/Time: 04/05/2021/2:55:04 PM    Final      EKG: SR   Physical Examination: Temp:  [97.5 F (36.4 C)-98.1 F (36.7 C)] 97.6 F (36.4 C) (09/07 1300) Pulse Rate:  [64-76] 66 (09/07 1300) Resp:  [16-28] 19 (09/07 1300) BP: (102-163)/(65-110) 102/77 (09/07 1300) SpO2:  [94 %-100 %] 94 % (09/07 1300) Weight:  [74.8 kg-74.9 kg] 74.9 kg (09/06 2355)  Temp:  [97.5 F (36.4 C)-98.1 F (36.7 C)] 97.6 F (36.4 C) (09/07 1300) Pulse Rate:  [64-76] 66 (09/07 1300) Resp:  [16-28] 19 (09/07 1300) BP: (102-163)/(65-110) 102/77 (09/07 1300) SpO2:  [94 %-100 %] 94 % (09/07 1300) Weight:  [74.8 kg-74.9 kg] 74.9 kg (09/06 2355)   General - Well nourished, well developed, resting in bed in no apparent distress. Ophthalmologic - fundi not visualized due to noncooperation.   Cardiovascular - Regular rhythm and rate.   Mental Status -  Level of arousal and orientation to time, place, and person were intact. Language was mildly dysarthric, speaking fluently in full sentences. Able to repeat 5 word sentence, name 3 objects.  Attention span and concentration were normal were mildly impaired due to anxiety. Some repetitive questions asked.  Recent and remote memory were intact. Fund of Knowledge was assessed and was intact.   Cranial Nerves II - XII - II - Visual field intact OU. III, IV, VI - Extraocular movements intact. V - Facial sensation intact bilaterally. VII - Facial movement intact bilaterally. VIII - Hearing & vestibular intact bilaterally. X - Palate elevates symmetrically. XI - Chin turning & shoulder shrug intact bilaterally. XII - Tongue protrusion intact with deviation right noted.   Motor Strength - +LUE drift, No LLE drift.  Left hemibody 4/5. Strength.  Right UE and LE 4+/5 strength. Bulk was normal and fasciculations were absent.  Motor Tone - Muscle tone was assessed at the neck and appendages and was normal.   Sensory - Light touch,  temperature/pinprick were assessed and were symmetrical.     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    Gait and Station - deferred  ASSESSMENT/PLAN  Tabia Landowski is a 80 y.o. 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  This happened around 3 PM last evening.  This persisted for about 2 hours then patient decided to come to the ER.  Denies any weakness of upper or lower extremities.  Denies any visual symptoms.  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 Pt had loop for 3+ years, no afib  Code Stroke CT head No acute abnormality.  CTA head & neck Negative CTA for large vessel occlusion. 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 Left common to subclavian artery graft in place with perfusion of the left subclavian artery distally. Widely patent flow through the graft. 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 Numerous chronic supratentorial and infratentorial infarcts. 2D Echo  EF 60-65%, LVH, Grade I diastolic dysfunction, No thrombus, wall motion abnormality or shunt found.   LDL 79 HgbA1c 7.7 UDS negative VTE prophylaxis -Lovenox History of CAD status post stenting on statins aspirin, Plavix PTA, now continue Plavix and ASA 81 mg. History of loop recorder placement in 2019 with no afib found, interrogated today with full battery depletion noted. 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   CTA showing approximately 50% right ICA stenosis and 40% left ICA stenosis Carotid doppler study is PENDING Vascular surgery consult is PENDING  History of stroke Per patient, she has multiple stroke/TIA in the past Per chart, patient had left ACA infarct in the past, had loop recorder placed in 2019 with no A. fib found, interrogated loop recorder today with full battery depletion noted.  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 Jonnie Finner, NP-C  ATTENDING NOTE: I reviewed above note and agree with the assessment and plan. Pt was seen and examined.   80 year old left-handed female with history of thoracic aortic aneurysm status post repair and left carotid subclavian artery bypass, diabetes, hypertension, OSA, non-STEMI and CAD status post stent, history of stroke status post loop recorder but no A. fib admitted for aphasia.  Symptoms lasted about an hour and resolved.  CT no  acute abnormality but old embolic infarcts.  CTA head and neck right ICA 50%, left ICA 40% stenosis. There is also a soft plaque at origin of innominate artery.  MRI showed right frontal small cortical infarcts.  EF 60 to 65%.  Carotid Doppler pending.  LDL 79, A1c 7.7, UDS negative.  Creatinine 0.98  On exam, patient awake alert, fully orientated, no aphasia, slight dysarthria, follows  simple commands.  No focal deficit seen.  Moving all extremities symmetrically.  Etiology for patient stroke could be due to right ICA stenosis with atherosclerosis vs innominate artery soft plaque atherosclerosis.  Patient had loop recorder for last 3+ years and no A. fib found.  Discussed with Dr. Chestine Spore vascular surgery, will wait for the carotid Doppler for further decision-making.  Continue aspirin and Plavix as well as statin.  Avoid low BP.  We will follow.  For detailed assessment and plan, please refer to above as I have made changes wherever appropriate.   Marvel Plan, MD PhD Stroke Neurology 04/05/2021 6:05 PM    To contact Stroke Continuity provider, please refer to [email or web address]. After hours, contact General Neurology

## 2021-04-05 NOTE — Progress Notes (Signed)
  PROGRESS NOTE    Erin Baker  EKC:[id number] DOB: 11-06-1940 DOA: 04/04/2021  PCP: Pcp, No    LOS - 1    Patient admitted after midnight for evaluation of dysarthria.  Interval subjective: Patient up in recliner when seen today.  She reports dysarthria has almost fully resolved.  She does feel slightly different on the left side of her face she describes it as thickness not really numbness.  Exam: Awake alert and in no acute distress. Respiratory: Normal respiratory effort on room air and lungs clear Cardiovascular regular rate and rhythm, no peripheral edema Neuro -A&O x4, minimal dysarthria speech is near normal, no other focal deficits Abdomen soft and nontender   Principal Problem:   Dysarthria Active Problems:   CAD (coronary artery disease)   Hypertension   Normocytic anemia   Type 2 diabetes mellitus with vascular disease (HCC)    I have reviewed the full H&P by Dr. Toniann Fail in detail, and I agree with the assessment and plan as outlined therein. In addition:  MRI brain showed a small right frontal infarct. Management per stroke team, follow-up their recommendations.  --Vascular surgery consulted for consideration of right CEA, Dr. Chestine Spore to see   No Charge    Pennie Banter, DO [facility]   If 7PM-7AM, please contact night-coverage [email or web address] 04/05/2021, 8:30 AM

## 2021-04-05 NOTE — Plan of Care (Signed)
  Problem: Health Behavior/Discharge Planning: Goal: Ability to manage health-related needs will improve Outcome: Progressing   Problem: Clinical Measurements: Goal: Will remain free from infection Outcome: Progressing   

## 2021-04-05 NOTE — H&P (Signed)
History and Physical    Erin Baker SAY:[id number] DOB: 10-May-1941 DOA: 04/04/2021  PCP: Pcp, No  Patient coming from: Home.  Chief Complaint: Difficulty speaking.  HPI: Erin Baker is a 80 y.o. 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  This happened around 3 PM last evening.  This persisted for about 2 hours then patient decided to come to the ER.  Denies any weakness of upper or lower extremities.  Denies any visual symptoms.  ED Course: In the ER CT head is unremarkable.  Tele-neurologist was consulted at this time requested work-up for possible metabolic reasons and also if no reason found then to get MRI brain to rule out stroke.  Patient did pass stroke swallow.  CT angiogram of the head and neck was done which did not show any large vessel obstruction but did show some 50% plaque around the carotid bifurcation.  Labs show hemoglobin 11.9 WBC 10.8 creatinine 1.09 glucose 168 COVID test with a negative drug screen negative EKG normal sinus rhythm.  On exam patient had some deviation of the tongue on the left side.  Review of Systems: As per HPI, rest all negative.   Past Medical History:  Diagnosis Date   Aortic aneurysm (HCC)    Diabetes mellitus without complication (HCC)    Hypertension    Stroke [facility])     Past Surgical History:  Procedure Laterality Date   APPENDECTOMY     CARDIAC CATHETERIZATION     COLON SURGERY     CORONARY ANGIOPLASTY       reports that she has quit smoking. Her smoking use included cigarettes. She has never been exposed to tobacco smoke. She has never used smokeless tobacco. She reports that she does not drink alcohol and does not use drugs.  Allergies  Allergen Reactions   Morphine     Family History  Problem Relation Age of Onset    Diabetes type II Granddaughter    CAD Neg Hx     Prior to Admission medications   Not on File    Physical Exam: Constitutional: Moderately built and nourished. Vitals:   04/04/21 2200 04/04/21 2355 04/05/21 0001 04/05/21 0205  BP: (!) 143/68  (!) 159/75 (!) 127/110  Pulse: 68  68 66  Resp: (!) 28  16 (!) 24  Temp:   97.9 F (36.6 C) 98.1 F (36.7 C)  TempSrc:   Oral Oral  SpO2: 96%  100% 95%  Weight:  74.9 kg    Height:  5\' 5"  (1.651 m)     Eyes: Anicteric no pallor. ENMT: No discharge from the ears eyes nose and mouth. Neck: No mass felt.  No neck rigidity. Respiratory: No rhonchi or crepitations. Cardiovascular: S1-S2 heard. Abdomen: Soft nontender bowel sound present. Musculoskeletal: No edema. Skin: No rash. Neurologic: Alert awake oriented time place and person.  Moves all extremities 5 x 5.  No facial asymmetry tongue is midline pupils equal and reacting to light.  Mildly left-sided tongue deviation which is not always consistent. Psychiatric: Appears normal.  Normal affect.   Labs on Admission: I have personally reviewed following labs and imaging studies  CBC: Recent Labs  Lab 04/04/21 1953 04/05/21 0011  WBC 10.8* 9.1  NEUTROABS 7.1 6.0  HGB 11.9* 10.7*  HCT 37.6 35.0*  MCV 83.2 82.9  PLT 274 231  Basic Metabolic Panel: Recent Labs  Lab 04/04/21 1953  NA 136  K 4.0  CL 101  CO2 27  GLUCOSE 168*  BUN 26*  CREATININE 1.09*  CALCIUM 9.4   GFR: Estimated Creatinine Clearance: 43.1 mL/min (A) (by C-G formula based on SCr of 1.09 mg/dL (H)). Liver Function Tests: Recent Labs  Lab 04/04/21 1953  AST 17  ALT 10  ALKPHOS 96  BILITOT 0.7  PROT 6.8  ALBUMIN 3.6   No results for input(s): LIPASE, AMYLASE in the last 168 hours. No results for input(s): AMMONIA in the last 168 hours. Coagulation Profile: Recent Labs  Lab 04/04/21 1953  INR 1.1   Cardiac Enzymes: No results for input(s): CKTOTAL, CKMB, CKMBINDEX, TROPONINI in the last 168  hours. BNP (last 3 results) No results for input(s): PROBNP in the last 8760 hours. HbA1C: No results for input(s): HGBA1C in the last 72 hours. CBG: Recent Labs  Lab 04/04/21 1948  GLUCAP 173*   Lipid Profile: No results for input(s): CHOL, HDL, LDLCALC, TRIG, CHOLHDL, LDLDIRECT in the last 72 hours. Thyroid Function Tests: No results for input(s): TSH, T4TOTAL, FREET4, T3FREE, THYROIDAB in the last 72 hours. Anemia Panel: No results for input(s): VITAMINB12, FOLATE, FERRITIN, TIBC, IRON, RETICCTPCT in the last 72 hours. Urine analysis:    Component Value Date/Time   COLORURINE YELLOW 04/04/2021 2252   APPEARANCEUR CLOUDY (A) 04/04/2021 2252   LABSPEC 1.020 04/04/2021 2252   PHURINE 7.0 04/04/2021 2252   GLUCOSEU NEGATIVE 04/04/2021 2252   HGBUR NEGATIVE 04/04/2021 2252   BILIRUBINUR NEGATIVE 04/04/2021 2252   KETONESUR NEGATIVE 04/04/2021 2252   PROTEINUR NEGATIVE 04/04/2021 2252   NITRITE POSITIVE (A) 04/04/2021 2252   LEUKOCYTESUR NEGATIVE 04/04/2021 2252   Sepsis Labs: (procalcitonin:4,lacticidven:4) ) Recent Results (from the past 240 hour(s))  Resp Panel by RT-PCR (Flu A&B, Covid) Nasopharyngeal Swab     Status: None   Collection Time: 04/04/21  7:53 PM   Specimen: Nasopharyngeal Swab; Nasopharyngeal(NP) swabs in vial transport medium  Result Value Ref Range Status   SARS Coronavirus 2 by RT PCR NEGATIVE NEGATIVE Final    Comment: (NOTE) SARS-CoV-2 target nucleic acids are NOT DETECTED.  The SARS-CoV-2 RNA is generally detectable in upper respiratory specimens during the acute phase of infection. The lowest concentration of SARS-CoV-2 viral copies this assay can detect is 138 copies/mL. A negative result does not preclude SARS-Cov-2 infection and should not be used as the sole basis for treatment or other patient management decisions. A negative result may occur with  improper specimen collection/handling, submission of specimen other than  nasopharyngeal swab, presence of viral mutation(s) within the areas targeted by this assay, and inadequate number of viral copies(<138 copies/mL). A negative result must be combined with clinical observations, patient history, and epidemiological information. The expected result is Negative.  Fact Sheet for Patients:  BloggerCourse.com  Fact Sheet for Healthcare Providers:  SeriousBroker.it  This test is no t yet approved or cleared by the [place] FDA and  has been authorized for detection and/or diagnosis of SARS-CoV-2 by FDA under an Emergency Use Authorization (EUA). This EUA will remain  in effect (meaning this test can be used) for the duration of the COVID-19 declaration under Section 564(b)(1) of the Act, 21 U.S.C.section 360bbb-3(b)(1), unless the authorization is terminated  or revoked sooner.       Influenza A by PCR NEGATIVE NEGATIVE Final   Influenza B by PCR NEGATIVE NEGATIVE Final    Comment: (NOTE) The Xpert Xpress SARS-CoV-2/FLU/RSV  plus assay is intended as an aid in the diagnosis of influenza from Nasopharyngeal swab specimens and should not be used as a sole basis for treatment. Nasal washings and aspirates are unacceptable for Xpert Xpress SARS-CoV-2/FLU/RSV testing.  Fact Sheet for Patients: BloggerCourse.com  Fact Sheet for Healthcare Providers: SeriousBroker.it  This test is not yet approved or cleared by the [place] FDA and has been authorized for detection and/or diagnosis of SARS-CoV-2 by FDA under an Emergency Use Authorization (EUA). This EUA will remain in effect (meaning this test can be used) for the duration of the COVID-19 declaration under Section 564(b)(1) of the Act, 21 U.S.C. section 360bbb-3(b)(1), unless the authorization is terminated or revoked.  Performed at [facility], [address] [place], [place]  [address]      Radiological Exams on Admission: CT Angio Head W or Wo Contrast  Result Date: 04/04/2021 CLINICAL DATA:  Initial evaluation for acute stroke. EXAM: CT ANGIOGRAPHY HEAD AND NECK TECHNIQUE: Multidetector CT imaging of the head and neck was performed using the standard protocol during bolus administration of intravenous contrast. Multiplanar CT image reconstructions and MIPs were obtained to evaluate the vascular anatomy. Carotid stenosis measurements (when applicable) are obtained utilizing NASCET criteria, using the distal internal carotid diameter as the denominator. CONTRAST:  70mL OMNIPAQUE IOHEXOL 350 MG/ML SOLN COMPARISON:  Prior head CT from earlier the same day. 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 Origin of the innominate artery is markedly irregular without high-grade stenosis. 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 Left common to subclavian artery graft in place with perfusion of the left subclavian artery distally. Widely patent flow through the graft. Right carotid system: Right CCA patent to the bifurcation without stenosis. Bulky calcified plaque about the right carotid bulb with associated 50% stenosis by NASCET criteria. Right ICA widely patent distally. Left carotid system: Left CCA patent from its origin to the bifurcation without stenosis. Bulky calcified plaque about the left carotid bulb/proximal left ICA with associated 40% stenosis by NASCET criteria. Left ICA widely patent distally. Vertebral arteries: Both vertebral arteries arise from subclavian arteries. Vertebral arteries widely patent within the neck without stenosis or other abnormality. Skeleton: No visible discrete osseous lesions. Other neck: Postsurgical changes present within the left neck. Upper chest: Cardiomegaly, partially visualized. Review of the MIP images confirms the  above findings CTA HEAD FINDINGS Anterior circulation: Petrous segments patent bilaterally. Scattered atheromatous change throughout the carotid siphons with associated moderate multifocal narrowing. A1 segments patent bilaterally. Normal anterior communicating artery complex. Anterior cerebral arteries widely patent bilaterally. No M1 stenosis or occlusion. Distal MCA branches well perfused and symmetric. Posterior circulation: Both V4 segments patent to the vertebrobasilar junction without stenosis. Both PICA origins patent and normal. Basilar widely patent to its distal aspect without stenosis. Superior cerebellar arteries patent bilaterally. Both PCAs primarily supplied via the basilar and are well perfused to there distal aspects. Venous sinuses: Patent allowing for timing the contrast bolus. Anatomic variants: None significant.  No visible aneurysm. Review of the MIP images confirms the above findings IMPRESSION: 1. Negative CTA for large vessel occlusion. 2. 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 3. 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 Widely patent flow  through the graft. 4. 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 5. Cardiomegaly. Electronically Signed   By: Rise Mu M.D.   On: 04/04/2021 22:32   CT HEAD WO CONTRAST  Result Date: 04/04/2021 CLINICAL DATA:  Neuro deficit, acute, stroke suspected EXAM: CT HEAD WITHOUT CONTRAST TECHNIQUE: Contiguous axial images were obtained from the base of the skull through the vertex without intravenous contrast. COMPARISON:  None. FINDINGS: Brain: There is normal anatomic configuration of the brain. Mild parenchymal volume loss is commensurate with the patient's age. Mild  periventricular white matter changes are present likely reflecting the sequela of small vessel ischemia. There are areas of encephalomalacia involving the cerebellar hemispheres bilaterally, right temporoparietal cortex, inferior right frontal lobe, and left high frontoparietal cortex possibly the sequela of remote infarction. Additional lacunar infarcts are noted within the right lentiform nucleus, right caudate head, left corona radiata, right pons, and cerebellum. No definite evidence of acute intracranial infarct. No acute hemorrhage. No abnormal mass effect or midline shift. No intra or extra-axial mass lesion. Ventricular size is normal. Vascular: No asymmetric hyperdense vasculature at the skull base. Extensive atherosclerotic calcification within the carotid siphons. Skull: Intact Sinuses/Orbits: The visualized paranasal sinuses are clear. The orbits are unremarkable. Other: Mastoid air cells and middle ear cavities are clear. IMPRESSION: No acute intracranial abnormality identified. Multiple supratentorial and infratentorial foci of encephalomalacia, likely representing multiple infarcts in both anterior and posterior circulatory territories. This may reflect the sequela of a central embolic source. Mild senescent change. Electronically Signed   By: Helyn Numbers M.D.   On: 04/04/2021 20:47   CT Angio Neck W and/or Wo Contrast  Result Date: 04/04/2021 CLINICAL DATA:  Initial evaluation for acute stroke. EXAM: CT ANGIOGRAPHY HEAD AND NECK TECHNIQUE: Multidetector CT imaging of the head and neck was performed using the standard protocol during bolus administration of intravenous contrast. Multiplanar CT image reconstructions and MIPs were obtained to evaluate the vascular anatomy. Carotid stenosis measurements (when applicable) are obtained utilizing NASCET criteria, using the distal internal carotid diameter as the denominator. CONTRAST:  69mL OMNIPAQUE IOHEXOL 350 MG/ML SOLN COMPARISON:  Prior head CT  from earlier the same day. 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 Origin of the innominate artery is markedly irregular without high-grade stenosis. 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 Left common to subclavian artery graft in place with perfusion of the left subclavian artery distally. Widely patent flow through the graft. Right carotid system: Right CCA patent to the bifurcation without stenosis. Bulky calcified plaque about the right carotid bulb with associated 50% stenosis by NASCET criteria. Right ICA widely patent distally. Left carotid system: Left CCA patent from its origin to the bifurcation without stenosis. Bulky calcified plaque about the left carotid bulb/proximal left ICA with associated 40% stenosis by NASCET criteria. Left ICA widely patent distally. Vertebral arteries: Both vertebral arteries arise from subclavian arteries. Vertebral arteries widely patent within the neck without stenosis or other abnormality. Skeleton: No visible discrete osseous lesions. Other neck: Postsurgical changes present within the left neck. Upper chest: Cardiomegaly, partially visualized. Review of the MIP images confirms the above findings CTA HEAD FINDINGS Anterior circulation: Petrous segments patent bilaterally. Scattered atheromatous change throughout the carotid siphons with associated moderate multifocal narrowing. A1 segments patent bilaterally. Normal anterior communicating artery complex. Anterior cerebral arteries widely patent bilaterally. No M1 stenosis or occlusion. Distal  MCA branches well perfused and symmetric. Posterior circulation: Both V4 segments patent to the vertebrobasilar junction without stenosis. Both PICA origins patent and normal. Basilar widely patent to its distal aspect without stenosis. Superior cerebellar arteries patent bilaterally. Both PCAs  primarily supplied via the basilar and are well perfused to there distal aspects. Venous sinuses: Patent allowing for timing the contrast bolus. Anatomic variants: None significant.  No visible aneurysm. Review of the MIP images confirms the above findings IMPRESSION: 1. Negative CTA for large vessel occlusion. 2. 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 3. 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 Widely patent flow through the graft. 4. 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 5. Cardiomegaly. Electronically Signed   By: Rise MuBenjamin  McClintock M.D.   On: 04/04/2021 22:32    EKG: Independently reviewed.  Normal sinus rhythm with nonspecific ST-T changes.  Assessment/Plan Principal Problem:   Dysarthria Active Problems:   CAD (coronary artery disease)   Hypertension   Normocytic anemia   Type 2 diabetes mellitus with vascular disease (HCC)    Dysarthria -plan is to get MRI of the brain to make sure patient did not have stroke.  Patient did pass stroke swallow.  Check 2D echo patient is already on aspirin and Plavix and statins.  Physical therapy consult. History of CAD status post stenting on statins aspirin Plavix and beta-blockers. Hypertension on lisinopril and beta-blockers.  May need to hold if MRI does show stroke for permissive hypertension.  Medications are dosed for her data. Normocytic normochromic anemia follow CBC. Chronic kidney disease stage II creatinine appears to be at baseline. History of endovascular repair of thoracic aortic aneurysm and also subclavian artery surgery.  Since patient has dysarthria and concerning features for possible stroke will need further work-up and inpatient status.   DVT  prophylaxis: Lovenox. Code Status: Full code. Family Communication: Discussed with patient. Disposition Plan: Home. Consults called: Neurology. Admission status: Inpatient.   Eduard ClosArshad N Brynlea Spindler MD [facility] Pager [phone].  If 7PM-7AM, please contact night-coverage [email or web address] Password [email or web address]  04/05/2021, 2:12 AM

## 2021-04-05 NOTE — Evaluation (Signed)
Speech Language Pathology Evaluation Patient Details Name: Erin Baker MRN: [id number] DOB: 1941-01-05 Today's Date: 04/05/2021 Time: 1050-1120 SLP Time Calculation (min) (ACUTE ONLY): 30 min  Problem List:  Patient Active Problem List   Diagnosis Date Noted   CAD (coronary artery disease) 04/05/2021   Hypertension 04/05/2021   Normocytic anemia 04/05/2021   Type 2 diabetes mellitus with vascular disease (HCC) 04/05/2021   Dysarthria 04/04/2021   Past Medical History:  Past Medical History:  Diagnosis Date   Aortic aneurysm (HCC)    Diabetes mellitus without complication (HCC)    Hypertension    Stroke [facility])    Past Surgical History:  Past Surgical History:  Procedure Laterality Date   APPENDECTOMY     CARDIAC CATHETERIZATION     COLON SURGERY     CORONARY ANGIOPLASTY     HPI:  80yo female admitted 04/04/21 with dysarthria. CTHead negative for acute finding. PMH: CAD, CVA, thoracic endovascular aneurysm s/p repair, subclavian artery repair, HTN, DM2, tobacco abuse. MRI pending   Assessment / Plan / Recommendation Clinical Impression  Pt was seen at bedside for speech/language/cognitive assessment. Pt was awake and alert, willing to participate in evaluation. The Mini-Mental State Examination was administered. Pt scored 26/30, raising concern for mild neurocognitive deficits. Pt indicated the year as 1922 (stating it several times), and was inaccurate for day and date. Pt recalled 2/3 words with delay. Pt speech is intelligible, however, CN exam is remarkable for lingual deviation, and pt reports the left side of her face "feels different". She does not indicate numbness or tingling. Reviewed lingual strengthening exercises with pt, and encouraged use of strategies to maximize intelligibility (slow rate, overarticulation).   Of note, pt reported she has difficulty with short term memory, she frequently misses appointments because she writes it down wrong in her calendar, and she  forgets to take medications. Recommend consideration of increased supervision at home to maximize safety. Home health speech therapy is recommended at discharge.    SLP Assessment  SLP Recommendation/Assessment: All further Speech Language Pathology needs can be addressed in the next venue of care  SLP Visit Diagnosis: Dysarthria and anarthria (R47.1);Cognitive communication deficit (R41.841)    Follow Up Recommendations  Home health SLP          SLP Evaluation Cognition  Overall Cognitive Status: No family/caregiver present to determine baseline cognitive functioning Arousal/Alertness: Awake/alert Orientation Level: Oriented X4 Memory: Impaired Memory Impairment: Decreased recall of new information;Decreased short term memory;Retrieval deficit Decreased Short Term Memory: Verbal basic       Comprehension  Auditory Comprehension Overall Auditory Comprehension: Appears within functional limits for tasks assessed Reading Comprehension Reading Status: Within funtional limits    Expression Expression Primary Mode of Expression: Verbal Verbal Expression Overall Verbal Expression: Appears within functional limits for tasks assessed Written Expression Dominant Hand: Left Written Expression: Within Functional Limits   Oral / Motor  Oral Motor/Sensory Function Overall Oral Motor/Sensory Function: Mild impairment Facial ROM: Within Functional Limits Facial Symmetry: Within Functional Limits Facial Strength: Within Functional Limits Lingual ROM: Within Functional Limits Lingual Symmetry: Abnormal symmetry left Lingual Strength: Reduced Velum: Within Functional Limits Mandible: Within Functional Limits Motor Speech Overall Motor Speech: Impaired Respiration: Within functional limits Phonation: Normal Resonance: Within functional limits Articulation: Impaired Intelligibility: Intelligible Motor Planning: Witnin functional limits   GO                   Erin Baker, MSP,  CCC-SLP Speech Language Pathologist Office: [phone]  Wanda Plump  Brown 04/05/2021, 11:38 AM

## 2021-04-05 NOTE — Progress Notes (Signed)
PT Cancellation Note  Patient Details Name: Erin Baker MRN: [id number] DOB: Mar 11, 1941   Cancelled Treatment:    Reason Eval/Treat Not Completed: Patient at procedure or test/unavailable - pt at MRI, will check back as schedule allows.  Marye Round, PT DPT Acute Rehabilitation Services Pager [phone]  Office [phone]    Truddie Coco 04/05/2021, 12:33 PM

## 2021-04-05 NOTE — Evaluation (Signed)
Occupational Therapy Evaluation Patient Details Name: Erin [place] MRN: [id number] DOB: 1941/01/02 Today's Date: 04/05/2021    History of Present Illness 80 y.o. female presenting to ED 9/6 with dysarthria. CT head (-) for acute findings. CT angio (+) plaque around carotic bifurcation. Did not pass stroke swallow test. MRI 9/7 with results pending. PMHx significant for CAD s/p stenting, Hx of CVA, thoracic endovascular aneurysm s/p repair,  subclavian artery repair, HTN, DMII and Hx of tobacco use.   Clinical Impression   PTA patient was living alone in a 1-level townhouse and was grossly I with ADLs/IADLs without AD. Patient reports previously living at an ALF but left after 1 year. Patient currently requiring supervision A grossly for self-care tasks including LB dressing, 3/3 parts of toileting and hand hygiene in standing. Patient also limited by deficits listed below including decreased safety awareness, poor frustration tolerance, poor spatial awareness and questionable decreased higher level cognition and would benefit from continued acute OT services in prep for safe d/c to next level of care.   During evaluation patient became progressively more frustrated with this writers attempts to establish a safe d/c plan. When this writer asked patient if she felt safe to d/c home alone patient stated "Well I don't know, your job is to tell me where I'm going." When this writer explained that d/c planning is a collaborative effort patient stated "Well don't you know that people who've had strokes have brain changes and they can't think about that stuff?! You should know that!"  Patient then requested that this writer leave her room. Patient assisted from sink to recline. RN made aware of events. OT will continue to follow acutely.     Follow Up Recommendations  Other (comment) (TBD pending further assessment)    Equipment Recommendations  Other (comment) (TBD)    Recommendations for Other Services        Precautions / Restrictions Precautions Precautions: Fall Precaution Comments: Decreased awareness Restrictions Weight Bearing Restrictions: No      Mobility Bed Mobility Overal bed mobility: Independent                  Transfers Overall transfer level: Needs assistance Equipment used: None Transfers: Sit to/from Stand Sit to Stand: Supervision         General transfer comment: Supevision A for sit to stand from EOB x2 and from standard height commode +grab bar. Cues for safety as patient demonstrates poor awareness of lines/leads walking away from IV pole several times.    Balance Overall balance assessment: No apparent balance deficits (not formally assessed)                                         ADL either performed or assessed with clinical judgement   ADL Overall ADL's : Needs assistance/impaired     Grooming: Supervision/safety;Standing Grooming Details (indicate cue type and reason): Hand hygiene in standing with supervisoin A for safety.             Lower Body Dressing: Supervision/safety;Sit to/from stand Lower Body Dressing Details (indicate cue type and reason): Able to thread BLE through brief and hike over hips in standing with supervision A for safety/line management. Toilet Transfer: Radiographer, therapeutic Details (indicate cue type and reason): Supervision A for safety/line management. Toileting- Clothing Manipulation and Hygiene: Supervision/safety Toileting - Clothing Manipulation Details (indicate cue type and reason): 3/3 parts  of toileting task with supervision A for safety.             Vision Patient Visual Report: No change from baseline Additional Comments: Assessment limited by patient with increasing agitation. Will continue to assess.     Perception     Praxis      Pertinent Vitals/Pain Pain Assessment: 0-10 Pain Score: 2  Pain Descriptors / Indicators: Headache Pain  Intervention(s): Monitored during session     Hand Dominance Right   Extremity/Trunk Assessment Upper Extremity Assessment Upper Extremity Assessment: Overall WFL for tasks assessed   Lower Extremity Assessment Lower Extremity Assessment: Defer to PT evaluation   Cervical / Trunk Assessment Cervical / Trunk Assessment: Normal   Communication Communication Communication: Other (comment) (Dysarthria has improved but still present when patient is upset/frustrated)   Cognition Arousal/Alertness: Awake/alert Behavior During Therapy: Agitated Overall Cognitive Status: Impaired/Different from baseline Area of Impairment: Attention;Safety/judgement;Awareness;Problem solving                   Current Attention Level: Sustained     Safety/Judgement: Decreased awareness of safety;Decreased awareness of deficits Awareness: Emergent Problem Solving: Slow processing;Requires verbal cues General Comments: Patient A&Ox4; initially pleasant but got progessively more agitated as session progressed. When this writer asked patient if she felt safe to d/c home patient stated "Well I don't know, your job is to tell me where I'm going." When this writer explained that d/c planning is a collaborative effort patient stated "Well don't you know that people who've had strokes have brain changes and they can't think about that stuff?! You should know that!"   General Comments  VSS on RA.    Exercises     Shoulder Instructions      Home Living Family/patient expects to be discharged to:: Private residence Living Arrangements: Alone Available Help at Discharge: Family;Available PRN/intermittently (Daughter lives in [place] and visits every 2 weeks) Type of Home: Other(Comment) (Townhouse) Home Access: Level entry     Home Layout: One level     Bathroom Shower/Tub: Tub/shower unit;Walk-in shower   Bathroom Toilet: Standard     Home Equipment: None          Prior  Functioning/Environment Level of Independence: Independent        Comments: I with ADLs/IADLs including driving, meal prep and med management        OT Problem List: Decreased safety awareness      OT Treatment/Interventions: Self-care/ADL training;Therapeutic exercise;Energy conservation;Therapeutic activities;Cognitive remediation/compensation;Patient/family education;Balance training    OT Goals(Current goals can be found in the care plan section) Acute Rehab OT Goals Patient Stated Goal: To return home independently OT Goal Formulation: With patient Time For Goal Achievement: 04/19/21 Potential to Achieve Goals: Good ADL Goals Additional ADL Goal #1: Patient will complete ADLs with I demonstating good safety awareness. Additional ADL Goal #2: Patient will complete pillbox test with <1 error in prep for safe return to PLOF.  OT Frequency: Min 2X/week   Barriers to D/C: Decreased caregiver support  Lives alone; no friends or family nearby       Co-evaluation              AM-PAC OT "6 Clicks" Daily Activity     Outcome Measure Help from another person eating meals?: None Help from another person taking care of personal grooming?: A Little Help from another person toileting, which includes using toliet, bedpan, or urinal?: None Help from another person bathing (including washing, rinsing, drying)?: A Little Help from  another person to put on and taking off regular upper body clothing?: None Help from another person to put on and taking off regular lower body clothing?: A Little 6 Click Score: 21   End of Session Nurse Communication: Mobility status  Activity Tolerance: Treatment limited secondary to agitation Patient left: in chair;with call bell/phone within reach;with chair alarm set  OT Visit Diagnosis: Other symptoms and signs involving cognitive function                Time: 9604-5409 OT Time Calculation (min): 20 min Charges:  OT General Charges $OT  Visit: 1 Visit OT Evaluation $OT Eval Low Complexity: 1 Low  Alline Pio H. OTR/L Supplemental OT, Department of rehab services [phone]  Yoni Lobos R H. 04/05/2021, 9:51 AM

## 2021-04-05 NOTE — Progress Notes (Signed)
  Echocardiogram 2D Echocardiogram has been performed.  Erin [place] 04/05/2021, 2:19 PM

## 2021-04-05 NOTE — Evaluation (Signed)
Physical Therapy Evaluation Patient Details Name: Erin [place] MRN: [id number] DOB: 01/07/1941 Today's Date: 04/05/2021   History of Present Illness  80 y.o. female presenting to ED 9/6 with dysarthria. CT head (-) for acute findings. CT angio (+) plaque around carotic bifurcation. MRI brain shows Small acute right frontal infarct. PMHx significant for CAD s/p stenting, Hx of CVA x5, thoracic endovascular aneurysm s/p repair,  subclavian artery repair, HTN, DMII and Hx of tobacco use.  Clinical Impression   Pt presents with functional weakness, poor safety awareness, impaired balance with history of falls, unsteady gait, and decreased activity tolerance vs baseline. Pt to benefit from acute PT to address deficits. Pt ambulated hallway distance requiring steadying assist especially when challenged, pt making no real attempt to right self. At baseline, pt lives independently and attends exercise classes in the community. PT recommending CIR to maximize functional independence at d/c.  PT to progress mobility as tolerated, and will continue to follow acutely.      Follow Up Recommendations CIR    Equipment Recommendations  None recommended by PT    Recommendations for Other Services       Precautions / Restrictions Precautions Precautions: Fall Restrictions Weight Bearing Restrictions: No      Mobility  Bed Mobility Overal bed mobility: Needs Assistance Bed Mobility: Rolling;Sidelying to Sit;Sit to Supine Rolling: Supervision Sidelying to sit: Supervision   Sit to supine: Supervision   General bed mobility comments: for safety    Transfers Overall transfer level: Needs assistance Equipment used: None Transfers: Sit to/from Stand Sit to Stand: Supervision         General transfer comment: for safety, rises quickly and impulsively. STS x2, from EOB and toilet.  Ambulation/Gait Ambulation/Gait assistance: Min assist Gait Distance (Feet): 100 Feet Assistive device:  None Gait Pattern/deviations: Step-through pattern;Decreased stride length;Drifts right/left Gait velocity: decr   General Gait Details: min assist to steady, especially when challenged via head turns or directional changes. Pt with varying BOS and weaving of gait during mobility  Stairs            Wheelchair Mobility    Modified Rankin (Stroke Patients Only) Modified Rankin (Stroke Patients Only) Pre-Morbid Rankin Score: No significant disability Modified Rankin: Moderately severe disability     Balance Overall balance assessment: Needs assistance Sitting-balance support: Feet supported Sitting balance-[name] Scale: Good     Standing balance support: No upper extremity supported;During functional activity Standing balance-[name] Scale: Fair                               Pertinent Vitals/Pain Pain Assessment: No/denies pain    Home Living Family/patient expects to be discharged to:: Private residence Living Arrangements: Alone Available Help at Discharge: Family;Available PRN/intermittently (Daughter lives in [place] and visits every 2 weeks) Type of Home: Other(Comment) (Townhouse) Home Access: Level entry     Home Layout: One level Home Equipment: None      Prior Function Level of Independence: Independent         Comments: I with ADLs/IADLs including driving, meal prep and med management. Pt states she was exercising at the gym PTA in group classes     Hand Dominance   Dominant Hand: Right    Extremity/Trunk Assessment   Upper Extremity Assessment Upper Extremity Assessment: Defer to OT evaluation    Lower Extremity Assessment Lower Extremity Assessment: Overall WFL for tasks assessed (4/5 grossly LE)    Cervical / Trunk  Assessment Cervical / Trunk Assessment: Normal  Communication   Communication: Other (comment) (Dysarthria has improved but still present when patient is upset/frustrated)  Cognition Arousal/Alertness:  Awake/alert Behavior During Therapy: Impulsive Overall Cognitive Status: Impaired/Different from baseline Area of Impairment: Attention;Safety/judgement;Awareness;Problem solving                   Current Attention Level: Sustained     Safety/Judgement: Decreased awareness of safety;Decreased awareness of deficits Awareness: Emergent Problem Solving: Slow processing;Requires verbal cues General Comments: Pt very impulsive, lacks awareness of lines/leads and her relationship to them. Pt attempts to mobilize without PT multiple times and requires cues to wait. Pt able to state she had a CVA, tearful about this as well as the loss of her best friend and husband years ago. PT suspects pt is more emotional presently vs baseline.      General Comments      Exercises     Assessment/Plan    PT Assessment Patient needs continued PT services  PT Problem List Decreased strength;Decreased mobility;Decreased safety awareness;Decreased knowledge of use of DME;Decreased balance;Decreased activity tolerance;Decreased coordination       PT Treatment Interventions DME instruction;Therapeutic activities;Gait training;Therapeutic exercise;Patient/family education;Balance training;Stair training;Functional mobility training;Neuromuscular re-education    PT Goals (Current goals can be found in the Care Plan section)  Acute Rehab PT Goals Patient Stated Goal: To return home independently PT Goal Formulation: With patient Time For Goal Achievement: 04/19/21 Potential to Achieve Goals: Good    Frequency Min 4X/week   Barriers to discharge        Co-evaluation               AM-PAC PT "6 Clicks" Mobility  Outcome Measure Help needed turning from your back to your side while in a flat bed without using bedrails?: A Little Help needed moving from lying on your back to sitting on the side of a flat bed without using bedrails?: A Little Help needed moving to and from a bed to a chair  (including a wheelchair)?: A Little Help needed standing up from a chair using your arms (e.g., wheelchair or bedside chair)?: A Little Help needed to walk in hospital room?: A Little Help needed climbing 3-5 steps with a railing? : A Lot 6 Click Score: 17    End of Session   Activity Tolerance: Patient limited by fatigue Patient left: in bed;with call bell/phone within reach;with bed alarm set Nurse Communication: Mobility status PT Visit Diagnosis: Other abnormalities of gait and mobility (R26.89);Difficulty in walking, not elsewhere classified (R26.2)    Time: 6295-2841 PT Time Calculation (min) (ACUTE ONLY): 17 min   Charges:   PT Evaluation $PT Eval Low Complexity: 1 Low         Joylynn Defrancesco S, PT DPT Acute Rehabilitation Services Pager [phone]  Office [phone]   Truddie Coco 04/05/2021, 4:43 PM

## 2021-04-06 ENCOUNTER — Other Ambulatory Visit: Payer: Self-pay

## 2021-04-06 ENCOUNTER — Inpatient Hospital Stay (HOSPITAL_COMMUNITY): Payer: Medicare Other

## 2021-04-06 DIAGNOSIS — I6521 Occlusion and stenosis of right carotid artery: Secondary | ICD-10-CM | POA: Diagnosis not present

## 2021-04-06 DIAGNOSIS — Z8673 Personal history of transient ischemic attack (TIA), and cerebral infarction without residual deficits: Secondary | ICD-10-CM | POA: Diagnosis not present

## 2021-04-06 DIAGNOSIS — I63411 Cerebral infarction due to embolism of right middle cerebral artery: Secondary | ICD-10-CM | POA: Diagnosis not present

## 2021-04-06 DIAGNOSIS — R471 Dysarthria and anarthria: Secondary | ICD-10-CM | POA: Diagnosis not present

## 2021-04-06 DIAGNOSIS — E1159 Type 2 diabetes mellitus with other circulatory complications: Secondary | ICD-10-CM | POA: Diagnosis not present

## 2021-04-06 LAB — BASIC METABOLIC PANEL
Anion gap: 8 (ref 5–15)
BUN: 22 mg/dL (ref 8–23)
CO2: 23 mmol/L (ref 22–32)
Calcium: 9 mg/dL (ref 8.9–10.3)
Chloride: 104 mmol/L (ref 98–111)
Creatinine, Ser: 1 mg/dL (ref 0.44–1.00)
GFR, Estimated: 58 mL/min — ABNORMAL LOW (ref >60)
Glucose, Bld: 226 mg/dL — ABNORMAL HIGH (ref 70–99)
Potassium: 4.2 mmol/L (ref 3.5–5.1)
Sodium: 135 mmol/L (ref 135–145)

## 2021-04-06 LAB — GLUCOSE, CAPILLARY
Glucose-Capillary: 169 mg/dL — ABNORMAL HIGH (ref 70–99)
Glucose-Capillary: 249 mg/dL — ABNORMAL HIGH (ref 70–99)
Glucose-Capillary: 209 mg/dL — ABNORMAL HIGH (ref 70–99)
Glucose-Capillary: 134 mg/dL — ABNORMAL HIGH (ref 70–99)

## 2021-04-06 LAB — CBC
HCT: 34.5 % — ABNORMAL LOW (ref 36.0–46.0)
Hemoglobin: 10.5 g/dL — ABNORMAL LOW (ref 12.0–15.0)
MCH: 25.2 pg — ABNORMAL LOW (ref 26.0–34.0)
MCHC: 30.4 g/dL (ref 30.0–36.0)
MCV: 82.7 fL (ref 80.0–100.0)
Platelets: 231 10*3/uL (ref 150–400)
RBC: 4.17 MIL/uL (ref 3.87–5.11)
RDW: 15.7 % — ABNORMAL HIGH (ref 11.5–15.5)
WBC: 9.3 10*3/uL (ref 4.0–10.5)
nRBC: 0 % (ref 0.0–0.2)

## 2021-04-06 NOTE — Progress Notes (Signed)
  Progress Note    04/06/2021 7:47 AM * No surgery found *  Subjective:  She feels well. Tongue/speech complaints much improved. Speech eval completed. Ambulating without difficulty.   Vitals:   04/06/21 0400 04/06/21 0738  BP:  130/63  Pulse: 64 65  Resp:  17  Temp: 97.6 F (36.4 C) 97.6 F (36.4 C)  SpO2: 96% 95%    Physical Exam: General appearance: Awake, alert in no apparent distress Neurologic: Alert and oriented x4, tongue deviates to the left, face symmetric, grip strength 5/5 bilaterally. Speech is clear Cardiac: Heart rate and rhythm are regular Respirations: Nonlabored  CBC    Component Value Date/Time   WBC 9.3 04/06/2021 0104   RBC 4.17 04/06/2021 0104   HGB 10.5 (L) 04/06/2021 0104   HCT 34.5 (L) 04/06/2021 0104   PLT 231 04/06/2021 0104   MCV 82.7 04/06/2021 0104   MCH 25.2 (L) 04/06/2021 0104   MCHC 30.4 04/06/2021 0104   RDW 15.7 (H) 04/06/2021 0104   LYMPHSABS 1.9 04/05/2021 0011   MONOABS 0.9 04/05/2021 0011   EOSABS 0.2 04/05/2021 0011   BASOSABS 0.0 04/05/2021 0011    BMET    Component Value Date/Time   NA 135 04/06/2021 0104   K 4.2 04/06/2021 0104   CL 104 04/06/2021 0104   CO2 23 04/06/2021 0104   GLUCOSE 226 (H) 04/06/2021 0104   BUN 22 04/06/2021 0104   CREATININE 1.00 04/06/2021 0104   CALCIUM 9.0 04/06/2021 0104   GFRNONAA 58 (L) 04/06/2021 0104     Intake/Output Summary (Last 24 hours) at 04/06/2021 0747 Last data filed at 04/06/2021 0400 Gross per 24 hour  Intake 644.79 ml  Output 400 ml  Net 244.79 ml    HOSPITAL MEDICATIONS Scheduled Meds:  aspirin EC  81 mg Oral Daily   atorvastatin  80 mg Oral Daily   clopidogrel  75 mg Oral Daily   enoxaparin (LOVENOX) injection  40 mg Subcutaneous Q24H   insulin aspart  0-9 Units Subcutaneous TID WC   insulin detemir  30 Units Subcutaneous QHS   metoprolol tartrate  25 mg Oral BID   sertraline  50 mg Oral Daily   Continuous Infusions: PRN Meds:.acetaminophen **OR**  acetaminophen (TYLENOL) oral liquid 160 mg/5 mL **OR** acetaminophen, diphenhydrAMINE  Assessment and Plan: 80 year old female presents with dysarthria and imaging suggestive of right frontal cortical stroke with unclear etiology>> ? Right ICA stenosis. Numerous chronic supratentorial and infratentorial infarcts. 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 Bilateral carotid u/s ordered and is pending. Continue asa, statin, Plavix.   -DVT prophylaxis:  Lovenox   Wendi Maya, PA-C [facility] [phone] 04/06/2021  7:47 AM

## 2021-04-06 NOTE — Progress Notes (Signed)
VASCULAR LAB    Carotid duplex has been performed.  See CV proc for preliminary results.   Kingsly Kloepfer, RVT 04/06/2021, 12:09 PM

## 2021-04-06 NOTE — TOC Initial Note (Signed)
Transition of Care (TOC) - Initial/Assessment Note  Erin Pierini RN, BSN Transitions of Care Unit 4E- RN Case Manager See Treatment Team for direct phone #    Patient Details  Name: Erin [place] MRN: [id number] Date of Birth: [birth year]  Transition of Care [facility]) CM/SW Contact:    Darrold Span, RN Phone Number: 04/06/2021, 3:56 PM  Clinical Narrative:                 Pt admitted with acute infarct, noted plan for TCAR next wed Sept. 14 per vascular.  Per PT/OT pt needs 24/7 supervision at home.   CM in to speak with pt at bedside regarding transition needs. Per pt her daughter works as a travel Engineer, civil (consulting) currently assigned out of states. Pt reports that she does not have anyone that can stay with her 24/7 and daughter unable to change her assignment. Daughter is flying in this evening to see pt, however pt reports she is unable to stay- and will need to fly back for her work. Pt reports that she has a supplemental insurance plan that she thinks might have coverage for aide support at home but is not sure exactly what it covers. Also discussed rehab options. Per pt she has gone to several rehabs after her other strokes- including [facility] INPT rehab, [facility], a rehab in [place]. Pt is agreeable to look at rehab options including both INPT and SNF.  Pt gives permission to call her daughter to also discuss transition needs.   TC made to daughter Erin [place], who confirms she is flying in today and will arrive sometime this evening. She also states she is trying to make plans to be here again next week for her moms procedure on Sept. 14 however she will not be available 24/7 now. She will try to look at supplemental policy when she gets here to see if that might help- but doubtful.  Discussed Rehab options- which daughter confirms pt has been to rehab after her other strokes and that she would prefer her mom to go to rehab this time as well due to lack of supervision at home on discharge.  Explained that rehab would be based on insurance approval and bed availability- daughter voiced understanding. Agreeable to look into all options both INPT rehab and SNF options.  As pt is pending OR next Wed- doubtful we could get insurance approval for any type of rehab in time to get her out before needing to return for surgery, and pt does not have a safe discharge to home. As such pt may need to stay in house until procedure next week and then reassess for rehab needs.   TOC to continue to follow    Expected Discharge Plan: IP Rehab Facility Barriers to Discharge: Unsafe home situation   Patient Goals and CMS Choice Patient states their goals for this hospitalization and ongoing recovery are:: rehab CMS Medicare.gov Compare Post Acute Care list provided to:: Patient Choice offered to / list presented to : Patient, Adult Children  Expected Discharge Plan and Services Expected Discharge Plan: IP Rehab Facility In-house Referral: Clinical Social Work Discharge Planning Services: CM Consult Post Acute Care Choice: IP Rehab, Skilled Nursing Facility Living arrangements for the past 2 months: Single Family Home                                      Prior Living Arrangements/Services  Living arrangements for the past 2 months: Single Family Home Lives with:: Self Patient language and need for interpreter reviewed:: Yes Do you feel safe going back to the place where you live?: No   lives alone feels unsafe without supervision  Need for Family Participation in Patient Care: Yes (Comment) Care giver support system in place?: No (comment)   Criminal Activity/Legal Involvement Pertinent to Current Situation/Hospitalization: No - Comment as needed  Activities of Daily Living Home Assistive Devices/Equipment: None ADL Screening (condition at time of admission) Patient's cognitive ability adequate to safely complete daily activities?: Yes Is the patient deaf or have difficulty  hearing?: No Does the patient have difficulty seeing, even when wearing glasses/contacts?: No Does the patient have difficulty concentrating, remembering, or making decisions?: Yes Patient able to express need for assistance with ADLs?: Yes Does the patient have difficulty dressing or bathing?: No Independently performs ADLs?: Yes (appropriate for developmental age) Does the patient have difficulty walking or climbing stairs?: Yes Weakness of Legs: Both Weakness of Arms/Hands: Left  Permission Sought/Granted Permission sought to share information with : Family Supports Permission granted to share information with : Yes, Verbal Permission Granted  Share Information with NAME: Erin [place]     Permission granted to share info w Relationship: daughter  Permission granted to share info w Contact Information: [phone]  Emotional Assessment Appearance:: Appears stated age Attitude/Demeanor/Rapport: Engaged Affect (typically observed): Appropriate, Accepting Orientation: : Oriented to Self, Oriented to Place, Oriented to  Time, Oriented to Situation Alcohol / Substance Use: Not Applicable Psych Involvement: No (comment)  Admission diagnosis:  Dysarthria [R47.1] Patient Active Problem List   Diagnosis Date Noted   CAD (coronary artery disease) 04/05/2021   Hypertension 04/05/2021   Normocytic anemia 04/05/2021   Type 2 diabetes mellitus with vascular disease (HCC) 04/05/2021   Cerebral embolism with cerebral infarction 04/05/2021   Dysarthria 04/04/2021   PCP:  Pcp, No Pharmacy:   [id number] - [place], [place] - [address]OSS [address] [place] [address] Phone: [phone] Fax: [phone]     Social Determinants of Health (SDOH) Interventions    Readmission Risk Interventions No flowsheet data found.

## 2021-04-06 NOTE — Progress Notes (Signed)
Occupational Therapy Treatment Patient Details Name: Erin Baker MRN: [id number] DOB: May 02, 1941 Today's Date: 04/06/2021    History of present illness 80 y.o. female presenting to ED 9/6 with dysarthria. CT head (-) for acute findings. CT angio (+) plaque around carotic bifurcation. Did not pass stroke swallow test. MRI 9/7 with results pending. PMHx significant for CAD s/p stenting, Hx of CVA x5, thoracic endovascular aneurysm s/p repair,  subclavian artery repair, HTN, DMII and Hx of tobacco use.   OT comments  Patient met seated in recliner in agreement with OT treatment session. Patient recognized this Probation officer from evaluation yesterday and apologized for "blowing up". Patient reports feeling overwhelmed stating "I just feel like my life doesn't matter anymore and that I'm as good as dead. In a way I'm already grieving my own death". Emotional support provided. Patient with several episodes of strong emotion throughout treatment session. Pillbox test (effective in differentiating graduated levels of executive dysfunction) completed this date. Misplaced movement errors noted with sum of pills placed in incorrect compartment equal to 3. Patient did not immediately correct errors but reports frequently re-checking pillpox when setting it up at home. Executive functioning is a strong indicator of everyday function and given patients deficits, she would require frequent to near constant supervision upon return home. Patient reports strained relationship with her daughter and inability for her daughter to stay with her or vise versa. OT will continue to follow acutely to maximize safety/independence with self-care tasks and establishment of safe d/c plan.    Follow Up Recommendations  Supervision/Assistance - 24 hour (Will require frequent to near constant supervision.)    Equipment Recommendations  Other (comment) (TBD)    Recommendations for Other Services      Precautions / Restrictions  Precautions Precautions: Fall Precaution Comments: Decreased awareness/insight; emotionally labile Restrictions Weight Bearing Restrictions: No       Mobility Bed Mobility Overal bed mobility: Needs Assistance Bed Mobility: Supine to Sit     Supine to sit: Supervision     General bed mobility comments: Seated EOB upon entry.    Transfers Overall transfer level: Needs assistance Equipment used: None Transfers: Sit to/from Stand Sit to Stand: Supervision         General transfer comment: for safety, pt with slight instability on initial stand, no physical assist given    Balance Overall balance assessment: Needs assistance Sitting-balance support: Feet supported Sitting balance-[name] Scale: Good     Standing balance support: No upper extremity supported;During functional activity;Single extremity supported Standing balance-[name] Scale: Fair Standing balance comment: pt progressed from needing no UE support to BUE support with gait due to fatigue and subsequent decrease in stability.                           ADL either performed or assessed with clinical judgement   ADL Overall ADL's : Needs assistance/impaired     Grooming: Supervision/safety;Standing               Lower Body Dressing: Supervision/safety;Sit to/from stand   Toilet Transfer: Supervision/safety   Toileting- Water quality scientist and Hygiene: Supervision/safety         General ADL Comments: Patient demonstrates higher level cognitive deficits; likely unable to d/c home independently.     Vision       Perception     Praxis      Cognition Arousal/Alertness: Awake/alert Behavior During Therapy: Agitated Overall Cognitive Status: Impaired/Different from baseline Area of Impairment: Attention;Safety/judgement;Awareness;Problem  solving                   Current Attention Level: Sustained     Safety/Judgement: Decreased awareness of safety;Decreased awareness  of deficits Awareness: Emergent Problem Solving: Slow processing;Requires verbal cues General Comments: A&Ox4; decreased awareness; decreased executive function; poor safety; poor insight; emotionally labile.        Exercises     Shoulder Instructions       General Comments VSS onRA    Pertinent Vitals/ Pain       Pain Assessment: No/denies pain Faces Pain Scale: Hurts little more Pain Location: low back, L hip Pain Descriptors / Indicators: Discomfort;Sore Pain Intervention(s): Limited activity within patient's tolerance;Repositioned  Home Living                                          Prior Functioning/Environment              Frequency  Min 2X/week        Progress Toward Goals  OT Goals(current goals can now be found in the care plan section)  Progress towards OT goals: Progressing toward goals  Acute Rehab OT Goals Patient Stated Goal: To return home independently OT Goal Formulation: With patient Time For Goal Achievement: 04/19/21 Potential to Achieve Goals: Good ADL Goals Additional ADL Goal #1: Patient will complete ADLs with I demonstating good safety awareness. Additional ADL Goal #2: Patient will complete pillbox test with <1 error in prep for safe return to PLOF.  Plan      Co-evaluation                 AM-PAC OT "6 Clicks" Daily Activity     Outcome Measure   Help from another person eating meals?: None Help from another person taking care of personal grooming?: A Little Help from another person toileting, which includes using toliet, bedpan, or urinal?: None Help from another person bathing (including washing, rinsing, drying)?: A Little Help from another person to put on and taking off regular upper body clothing?: None Help from another person to put on and taking off regular lower body clothing?: A Little 6 Click Score: 21    End of Session    OT Visit Diagnosis: Other symptoms and signs involving  cognitive function   Activity Tolerance Patient tolerated treatment well   Patient Left in chair;with call bell/phone within reach;with chair alarm set   Nurse Communication Mobility status        Time: 1117-3567 OT Time Calculation (min): 18 min  Charges: OT General Charges $OT Visit: 1 Visit OT Treatments $Therapeutic Activity: 8-22 mins  Abubakar Crispo H. OTR/L Supplemental OT, Department of rehab services [phone]   Feliza Diven R H. 04/06/2021, 9:55 AM

## 2021-04-06 NOTE — Progress Notes (Signed)
STROKE TEAM PROGRESS NOTE   SUBJECTIVE (INTERVAL HISTORY) Her RN is at the bedside.  Overall her condition is completely resolved.  No acute event overnight, no aphasia.  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  Dr. Chestine Sporelark vascular surgery on board, will likely to perform TCAR on the right.   OBJECTIVE Temp:  [97.6 F (36.4 C)-98.5 F (36.9 C)] 98 F (36.7 C) (09/08 1608) Pulse Rate:  [64-69] 66 (09/08 1608) Cardiac Rhythm: Normal sinus rhythm (09/08 1049) Resp:  [17-20] 17 (09/08 1608) BP: (108-147)/(53-112) 128/68 (09/08 1608) SpO2:  [95 %-97 %] 97 % (09/08 1608)  Recent Labs  Lab 04/05/21 1307 04/05/21 1723 04/05/21 2122 04/06/21 0611 04/06/21 1100  GLUCAP 143* 136* 191* 169* 249*   Recent Labs  Lab 04/04/21 1953 04/05/21 0417 04/06/21 0104  NA 136  --  135  K 4.0  --  4.2  CL 101  --  104  CO2 27  --  23  GLUCOSE 168*  --  226*  BUN 26*  --  22  CREATININE 1.09* 0.98 1.00  CALCIUM 9.4  --  9.0   Recent Labs  Lab 04/04/21 1953  AST 17  ALT 10  ALKPHOS 96  BILITOT 0.7  PROT 6.8  ALBUMIN 3.6   Recent Labs  Lab 04/04/21 1953 04/05/21 0011 04/06/21 0104  WBC 10.8* 9.1 9.3  NEUTROABS 7.1 6.0  --   HGB 11.9* 10.7* 10.5*  HCT 37.6 35.0* 34.5*  MCV 83.2 82.9 82.7  PLT 274 231 231   No results for input(s): CKTOTAL, CKMB, CKMBINDEX, TROPONINI in the last 168 hours. Recent Labs    04/04/21 1953  LABPROT 14.5  INR 1.1   Recent Labs    04/04/21 2252  COLORURINE YELLOW  LABSPEC 1.020  PHURINE 7.0  GLUCOSEU NEGATIVE  HGBUR NEGATIVE  BILIRUBINUR NEGATIVE  KETONESUR NEGATIVE  PROTEINUR NEGATIVE  NITRITE POSITIVE*  LEUKOCYTESUR NEGATIVE       Component Value Date/Time   CHOL 137 04/05/2021 0417   TRIG 118 04/05/2021 0417   HDL 34 (L) 04/05/2021 0417   CHOLHDL 4.0 04/05/2021 0417   VLDL 24 04/05/2021 0417   LDLCALC 79 04/05/2021 0417   Lab Results  Component Value Date   HGBA1C 7.7 (H) 04/05/2021      Component Value  Date/Time   LABOPIA NONE DETECTED 04/04/2021 2252   COCAINSCRNUR NONE DETECTED 04/04/2021 2252   LABBENZ NONE DETECTED 04/04/2021 2252   AMPHETMU NONE DETECTED 04/04/2021 2252   THCU NONE DETECTED 04/04/2021 2252   LABBARB NONE DETECTED 04/04/2021 2252    Recent Labs  Lab 04/04/21 1953  ETH <10    I have personally reviewed the radiological images below and agree with the radiology interpretations.  CT Angio Head W or Wo Contrast  Result Date: 04/04/2021 CLINICAL DATA:  Initial evaluation for acute stroke. EXAM: CT ANGIOGRAPHY HEAD AND NECK TECHNIQUE: Multidetector CT imaging of the head and neck was performed using the standard protocol during bolus administration of intravenous contrast. Multiplanar CT image reconstructions and MIPs were obtained to evaluate the vascular anatomy. Carotid stenosis measurements (when applicable) are obtained utilizing NASCET criteria, using the distal internal carotid diameter as the denominator. CONTRAST:  60mL OMNIPAQUE IOHEXOL 350 MG/ML SOLN COMPARISON:  Prior head CT from earlier the same day. 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 Origin of the innominate artery is markedly irregular without high-grade  stenosis. 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 Left common to subclavian artery graft in place with perfusion of the left subclavian artery distally. Widely patent flow through the graft. Right carotid system: Right CCA patent to the bifurcation without stenosis. Bulky calcified plaque about the right carotid bulb with associated 50% stenosis by NASCET criteria. Right ICA widely patent distally. Left carotid system: Left CCA patent from its origin to the bifurcation without stenosis. Bulky calcified plaque about the left carotid bulb/proximal left ICA with associated 40% stenosis by NASCET criteria. Left ICA widely patent  distally. Vertebral arteries: Both vertebral arteries arise from subclavian arteries. Vertebral arteries widely patent within the neck without stenosis or other abnormality. Skeleton: No visible discrete osseous lesions. Other neck: Postsurgical changes present within the left neck. Upper chest: Cardiomegaly, partially visualized. Review of the MIP images confirms the above findings CTA HEAD FINDINGS Anterior circulation: Petrous segments patent bilaterally. Scattered atheromatous change throughout the carotid siphons with associated moderate multifocal narrowing. A1 segments patent bilaterally. Normal anterior communicating artery complex. Anterior cerebral arteries widely patent bilaterally. No M1 stenosis or occlusion. Distal MCA branches well perfused and symmetric. Posterior circulation: Both V4 segments patent to the vertebrobasilar junction without stenosis. Both PICA origins patent and normal. Basilar widely patent to its distal aspect without stenosis. Superior cerebellar arteries patent bilaterally. Both PCAs primarily supplied via the basilar and are well perfused to there distal aspects. Venous sinuses: Patent allowing for timing the contrast bolus. Anatomic variants: None significant.  No visible aneurysm. Review of the MIP images confirms the above findings IMPRESSION: 1. Negative CTA for large vessel occlusion. 2. 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 3. 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 Widely patent flow through the graft. 4. 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 5. Cardiomegaly. Electronically Signed   By: Rise Mu M.D.   On: 04/04/2021 22:32   CT HEAD WO CONTRAST  Result Date:  04/04/2021 CLINICAL DATA:  Neuro deficit, acute, stroke suspected EXAM: CT HEAD WITHOUT CONTRAST TECHNIQUE: Contiguous axial images were obtained from the base of the skull through the vertex without intravenous contrast. COMPARISON:  None. FINDINGS: Brain: There is normal anatomic configuration of the brain. Mild parenchymal volume loss is commensurate with the patient's age. Mild periventricular white matter changes are present likely reflecting the sequela of small vessel ischemia. There are areas of encephalomalacia involving the cerebellar hemispheres bilaterally, right temporoparietal cortex, inferior right frontal lobe, and left high frontoparietal cortex possibly the sequela of remote infarction. Additional lacunar infarcts are noted within the right lentiform nucleus, right caudate head, left corona radiata, right pons, and cerebellum. No definite evidence of acute intracranial infarct. No acute hemorrhage. No abnormal mass effect or midline shift. No intra or extra-axial mass lesion. Ventricular size is normal. Vascular: No asymmetric hyperdense vasculature at the skull base. Extensive atherosclerotic calcification within the carotid siphons. Skull: Intact Sinuses/Orbits: The visualized paranasal sinuses are clear. The orbits are unremarkable. Other: Mastoid air cells and middle ear cavities are clear. IMPRESSION: No acute intracranial abnormality identified. Multiple supratentorial and infratentorial foci of encephalomalacia, likely representing multiple infarcts in both anterior and posterior circulatory territories. This may reflect the sequela of a central embolic source. Mild senescent change. Electronically Signed   By: Gloris Ham  Ramiro Harvest M.D.   On: 04/04/2021 20:47   CT Angio Neck W and/or Wo Contrast  Result Date: 04/04/2021 CLINICAL DATA:  Initial evaluation for acute stroke. EXAM: CT ANGIOGRAPHY HEAD AND NECK TECHNIQUE: Multidetector CT imaging of the head and neck was performed using the standard  protocol during bolus administration of intravenous contrast. Multiplanar CT image reconstructions and MIPs were obtained to evaluate the vascular anatomy. Carotid stenosis measurements (when applicable) are obtained utilizing NASCET criteria, using the distal internal carotid diameter as the denominator. CONTRAST:  60mL OMNIPAQUE IOHEXOL 350 MG/ML SOLN COMPARISON:  Prior head CT from earlier the same day. 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 Origin of the innominate artery is markedly irregular without high-grade stenosis. 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 Left common to subclavian artery graft in place with perfusion of the left subclavian artery distally. Widely patent flow through the graft. Right carotid system: Right CCA patent to the bifurcation without stenosis. Bulky calcified plaque about the right carotid bulb with associated 50% stenosis by NASCET criteria. Right ICA widely patent distally. Left carotid system: Left CCA patent from its origin to the bifurcation without stenosis. Bulky calcified plaque about the left carotid bulb/proximal left ICA with associated 40% stenosis by NASCET criteria. Left ICA widely patent distally. Vertebral arteries: Both vertebral arteries arise from subclavian arteries. Vertebral arteries widely patent within the neck without stenosis or other abnormality. Skeleton: No visible discrete osseous lesions. Other neck: Postsurgical changes present within the left neck. Upper chest: Cardiomegaly, partially visualized. Review of the MIP images confirms the above findings CTA HEAD FINDINGS Anterior circulation: Petrous segments patent bilaterally. Scattered atheromatous change throughout the carotid siphons with associated moderate multifocal narrowing. A1 segments patent bilaterally. Normal anterior communicating artery complex.  Anterior cerebral arteries widely patent bilaterally. No M1 stenosis or occlusion. Distal MCA branches well perfused and symmetric. Posterior circulation: Both V4 segments patent to the vertebrobasilar junction without stenosis. Both PICA origins patent and normal. Basilar widely patent to its distal aspect without stenosis. Superior cerebellar arteries patent bilaterally. Both PCAs primarily supplied via the basilar and are well perfused to there distal aspects. Venous sinuses: Patent allowing for timing the contrast bolus. Anatomic variants: None significant.  No visible aneurysm. Review of the MIP images confirms the above findings IMPRESSION: 1. Negative CTA for large vessel occlusion. 2. 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 3. 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 Widely patent flow through the graft. 4. 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 5. Cardiomegaly. Electronically Signed   By: Rise Mu M.D.   On: 04/04/2021 22:32   MR BRAIN WO CONTRAST  Result Date: 04/05/2021 CLINICAL DATA:  Neuro deficit, acute, stroke suspected. EXAM: MRI HEAD WITHOUT CONTRAST TECHNIQUE: Multiplanar, multiecho pulse sequences of the brain and surrounding structures were obtained without intravenous contrast. COMPARISON:  Head CT 04/04/2021 FINDINGS: Brain: There is a 1.2 cm acute cortical/subcortical infarct in the posterior right frontal lobe. Small to moderate-sized chronic infarcts are scattered throughout both cerebral hemispheres involving both MCA and PCA territories, and there are also numerous chronic bilateral cerebellar infarcts. Chronic lacunar infarcts are also noted in the basal ganglia bilaterally, right thalamus,  and pons. Chronic microhemorrhages are present in the right corona radiata and right cerebellar hemisphere. There is mild cerebral atrophy. No mass, midline shift, or extra-axial fluid collection is identified. Vascular: Major intracranial vascular flow voids are preserved. Skull and upper cervical spine: Unremarkable bone marrow signal. Sinuses/Orbits: Bilateral cataract extraction. Mild mucosal thickening in the maxillary sinuses. Trace bilateral mastoid fluid. Other: None. IMPRESSION: 1. Small acute right frontal infarct. 2. Numerous chronic supratentorial and infratentorial infarcts. Electronically Signed   By: Sebastian Ache M.D.   On: 04/05/2021 12:57   DG CHEST PORT 1 VIEW  Result Date: 04/05/2021 CLINICAL DATA:  Pre MRI. Metal in chest. History of a stroke. EXAM: PORTABLE CHEST 1 VIEW COMPARISON:  None. FINDINGS: Thoracic aortic stent graft noted. There also multiple vascular clips in left supraclavicular region. A loop recorder is noted on left side. The heart is mildly enlarged. Elevation of the left hemidiaphragm. No acute pulmonary findings. The bony thorax is intact. IMPRESSION: 1. Thoracic aortic stent graft noted. 2. No acute cardiopulmonary findings. Electronically Signed   By: Rudie Meyer M.D.   On: 04/05/2021 06:50   ECHOCARDIOGRAM COMPLETE  Result Date: 04/05/2021    ECHOCARDIOGRAM REPORT   Patient Name:   NAYLA DIAS Date of Exam: 04/05/2021 Medical Rec #:  [id number]    Height:       65.0 in Accession #:    [id number]   Weight:       165.1 lb Date of Birth:  1940/10/17    BSA:          1.823 m Patient Age:    13 years     BP:           102/77 mmHg Patient Gender: F            HR:           66 bpm. Exam Location:  Inpatient Procedure: 3D Echo, 2D Echo, Cardiac Doppler and Color Doppler Indications:    Stroke  History:        Patient has no prior history of Echocardiogram examinations.                 CAD, Stroke; Risk Factors:Diabetes and Current Smoker.  Sonographer:    Sheralyn Boatman RDCS  Referring Phys: [phone] Meryle Ready [facility]  Sonographer Comments: Technically difficult study due to poor echo windows. IMPRESSIONS  1. Left ventricular ejection fraction, by estimation, is 60 to 65%. The left ventricle has normal function. The left ventricle has no regional wall motion abnormalities. There is moderate asymmetric left ventricular hypertrophy of the posterior segment.  Left ventricular diastolic parameters are consistent with Grade I diastolic dysfunction (impaired relaxation).  2. Right ventricular systolic function is normal. The right ventricular size is normal.  3. The mitral valve is normal in structure. No evidence of mitral valve regurgitation. No evidence of mitral stenosis.  4. The aortic valve is normal in structure. Aortic valve regurgitation is not visualized. No aortic stenosis is present.  5. Aortic dilatation noted. Aneurysm of the ascending aorta, measuring 50 mm. There is borderline dilatation of the aortic root, measuring 37 mm. There is multilobular protruding plaque involving the ascending aorta and descending aorta.  6. The inferior vena cava is normal in size with greater than 50% respiratory variability, suggesting right atrial pressure of 3 mmHg.  7. Recommend Chest and Abdominal CTA for further assessment of aortic aneurysm as well as protruding plaque in the thoracic aorta which may be a source of  stroke. FINDINGS  Left Ventricle: Left ventricular ejection fraction, by estimation, is 60 to 65%. The left ventricle has normal function. The left ventricle has no regional wall motion abnormalities. The left ventricular internal cavity size was normal in size. There is  moderate asymmetric left ventricular hypertrophy of the posterior segment. Left ventricular diastolic parameters are consistent with Grade I diastolic dysfunction (impaired relaxation). Normal left ventricular filling pressure. Right Ventricle: The right ventricular size is normal. No increase in right ventricular  wall thickness. Right ventricular systolic function is normal. Left Atrium: Left atrial size was normal in size. Right Atrium: Right atrial size was normal in size. Pericardium: There is no evidence of pericardial effusion. Mitral Valve: The mitral valve is normal in structure. No evidence of mitral valve regurgitation. No evidence of mitral valve stenosis. Tricuspid Valve: The tricuspid valve is normal in structure. Tricuspid valve regurgitation is not demonstrated. No evidence of tricuspid stenosis. Aortic Valve: The aortic valve is normal in structure. Aortic valve regurgitation is not visualized. No aortic stenosis is present. Pulmonic Valve: The pulmonic valve was normal in structure. Pulmonic valve regurgitation is trivial. No evidence of pulmonic stenosis. Aorta: Aortic dilatation noted. There is borderline dilatation of the aortic root, measuring 37 mm. 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 There is multilobular plaque involving the ascending aorta and descending aorta. Venous: The inferior vena cava is normal in size with greater than 50% respiratory variability, suggesting right atrial pressure of 3 mmHg. IAS/Shunts: No atrial level shunt detected by color flow Doppler.  LEFT VENTRICLE PLAX 2D LVIDd:         4.10 cm     Diastology LVIDs:         3.00 cm     LV e' medial:    8.70 cm/s LV PW:         1.40 cm     LV E/e' medial:  6.5 LV IVS:        1.10 cm     LV e' lateral:   6.74 cm/s LVOT diam:     2.00 cm     LV E/e' lateral: 8.4 LV SV:         50 LV SV Index:   28 LVOT Area:     3.14 cm  LV Volumes (MOD) LV vol d, MOD A2C: 61.4 ml LV vol d, MOD A4C: 56.3 ml LV vol s, MOD A2C: 28.6 ml LV vol s, MOD A4C: 21.9 ml LV SV MOD A2C:     32.8 ml LV SV MOD A4C:     56.3 ml LV SV MOD BP:      33.8 ml RIGHT VENTRICLE            IVC RV S prime:     7.94 cm/s  IVC diam: 2.30 cm TAPSE (M-mode): 1.2 cm LEFT ATRIUM             Index       RIGHT ATRIUM          Index LA diam:        3.10 cm  1.70 cm/m  RA Area:     7.24 cm LA Vol (A2C):   18.6 ml 10.20 ml/m RA Volume:   12.10 ml 6.64 ml/m LA Vol (A4C):   20.2 ml 11.08 ml/m LA Biplane Vol: 19.5 ml 10.69 ml/m  AORTIC VALVE LVOT Vmax:   63.20 cm/s LVOT Vmean:  42.300 cm/s LVOT VTI:    0.160 m  AORTA Ao Root diam: 3.70 cm Ao Asc diam:  4.25 cm MITRAL VALVE MV Area (PHT): 2.45 cm    SHUNTS MV Decel Time: 310 msec    Systemic VTI:  0.16 m MV E velocity: 56.60 cm/s  Systemic Diam: 2.00 cm MV A velocity: 80.70 cm/s MV E/A ratio:  0.70 Armanda Magic MD Electronically signed by Armanda Magic MD Signature Date/Time: 04/05/2021/2:55:04 PM    Final    VAS US CAROTID  Result Date: 04/06/2021 Carotid Arterial Duplex Study Patient Name:  Andrey Spearman  Date of Exam:   04/06/2021 Medical Rec #: [id number]     Accession #:    [id number] Date of Birth: 09/06/1940     Patient Gender: F Patient Age:   89 years Exam Location:  [facility] Procedure:      VAS US CAROTID Referring Phys: Sherald Hess --------------------------------------------------------------------------------  Indications:       CVA, Speech disturbance and facial droop. CTA indicates 50%                    right ICA stenosis and 40% left ICA stenosis. Risk Factors:      Hypertension, Diabetes, past history of smoking, coronary                    artery disease, prior CVA. 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 Loop recorder. Comparison Study:  No prior study Performing Technologist: Sherren Kerns RVS  Examination Guidelines: A complete evaluation includes B-mode imaging, spectral Doppler, color Doppler, and power Doppler as needed of all accessible portions of each vessel. Bilateral testing is considered an integral part of a complete examination. Limited examinations for reoccurring indications may be performed as noted.  Right Carotid Findings:  +----------+--------+--------+--------+------------------+------------------+           PSV cm/sEDV cm/sStenosisPlaque DescriptionComments           +----------+--------+--------+--------+------------------+------------------+ CCA Prox  47      11                                intimal thickening +----------+--------+--------+--------+------------------+------------------+ CCA Distal42      14                                intimal thickening +----------+--------+--------+--------+------------------+------------------+ ICA Prox  201     69      60-79%  calcific          Shadowing          +----------+--------+--------+--------+------------------+------------------+ ICA Mid   123     38                                                   +----------+--------+--------+--------+------------------+------------------+ ICA Distal55      20                                                   +----------+--------+--------+--------+------------------+------------------+  ECA       117     12                                                   +----------+--------+--------+--------+------------------+------------------+ +----------+--------+-------+--------+-------------------+           PSV cm/sEDV cmsDescribeArm Pressure (mmHG) +----------+--------+-------+--------+-------------------+ [id number]                                        +----------+--------+-------+--------+-------------------+ +---------+--------+--+--------+--+ VertebralPSV cm/s32EDV cm/s11 +---------+--------+--+--------+--+  Left Carotid Findings: +----------+--------+--------+--------+---------------------+------------------+           PSV cm/sEDV cm/sStenosisPlaque Description   Comments           +----------+--------+--------+--------+---------------------+------------------+ CCA Prox  100     10                                   intimal thickening  +----------+--------+--------+--------+---------------------+------------------+ CCA Distal91      11                                   intimal thickening +----------+--------+--------+--------+---------------------+------------------+ ICA Prox  119     34      1-39%   calcific and         Shadowing                                            irregular                               +----------+--------+--------+--------+---------------------+------------------+ ICA Distal86      34                                                      +----------+--------+--------+--------+---------------------+------------------+ ECA       104     11                                                      +----------+--------+--------+--------+---------------------+------------------+ +----------+--------+--------+--------+-------------------+           PSV cm/sEDV cm/sDescribeArm Pressure (mmHG) +----------+--------+--------+--------+-------------------+ Subclavian171                                         +----------+--------+--------+--------+-------------------+ +---------+--------+--+--------+--+ VertebralPSV cm/s58EDV cm/s12 +---------+--------+--+--------+--+   Summary: Right Carotid: 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 Left Carotid: Velocities in the left ICA are consistent with a 1-39% stenosis. Vertebrals:  Bilateral vertebral arteries demonstrate antegrade flow.  Subclavians: Normal flow hemodynamics were seen in bilateral subclavian              arteries. *See table(s) above for measurements and observations.     Preliminary      PHYSICAL EXAM  Temp:  [97.6 F (36.4 C)-98.5 F (36.9 C)] 98 F (36.7 C) (09/08 1608) Pulse Rate:  [64-69] 66 (09/08 1608) Resp:  [17-20] 17 (09/08 1608) BP: (108-147)/(53-112) 128/68 (09/08 1608) SpO2:  [95 %-97 %] 97 % (09/08 1608)  General - Well nourished, well developed, in no  apparent distress.  Ophthalmologic - fundi not visualized due to noncooperation.  Cardiovascular - Regular rhythm and rate.  Mental Status -  Level of arousal and orientation to time, place, and person were intact. Language including expression, naming, repetition, comprehension was assessed and found intact. Fund of Knowledge was assessed and was intact.  Cranial Nerves II - XII - II - Visual field intact OU. III, IV, VI - Extraocular movements intact. V - Facial sensation intact bilaterally. VII - Facial movement intact bilaterally. VIII - Hearing & vestibular intact bilaterally. X - Palate elevates symmetrically. XI - Chin turning & shoulder shrug intact bilaterally. XII - Tongue protrusion intact.  Motor Strength - The patient's strength was normal in all extremities and pronator drift was absent.  Bulk was normal and fasciculations were absent.   Motor Tone - Muscle tone was assessed at the neck and appendages and was normal.  Reflexes - The patient's reflexes were symmetrical in all extremities and she had no pathological reflexes.  Sensory - Light touch, temperature/pinprick were assessed and were symmetrical.    Coordination - The patient had normal movements in the hands and feet with no ataxia or dysmetria.  Tremor was absent.  Gait and Station - deferred.   ASSESSMENT/PLAN Ms. Aicha Clingenpeel is a 80 y.o. female with history of thoracic aortic aneurysm status post repair and left carotid subclavian artery bypass, diabetes, hypertension, OSA, non-STEMI and CAD status post stent, history of stroke status post loop recorder but no A. fib admitted for aphasia.  Symptoms lasted about an hour and resolved.   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 Pt had loop for 3+ years, no afib  Code Stroke CT head No acute abnormality.  CTA head & neck Negative CTA for large vessel occlusion. Bulky calcified plaque about the carotid  bifurcations bilaterally, with associated stenoses of up to 50% on the right and 40% on the left. Left common to subclavian artery graft in place with perfusion of the left subclavian artery distally. Widely patent flow through the graft. 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 Numerous chronic supratentorial and infratentorial infarcts. 2D Echo  EF 60-65%, LVH, Grade I diastolic dysfunction, No thrombus, wall motion abnormality or shunt found.   LDL 79 HgbA1c 7.7 UDS negative VTE prophylaxis -Lovenox History of CAD status post stenting on statins aspirin and Plavix PTA, now continue Plavix and ASA 81 mg DAPT. History of loop recorder placement in 2019 with no afib found, interrogated today with full battery depletion noted. 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   CTA showing approximately 50% right ICA stenosis and 40% left ICA stenosis Carotid doppler right ICA 60-79% stenosis Vascular surgery Dr. Chestine Spore is  on board, will do TCAR    History of stroke Per patient, she has multiple stroke/TIA in the past Per chart, patient had left ACA infarct in the past, had loop recorder placed in 2019 with no A. fib found, interrogated loop recorder today with full battery depletion noted.   Hypertension Stable  Avoid low BP BP goal 130-160 before carotid intervention Long-term BP goal normotensive   Hyperlipidemia Home meds: Lipitor 80 resumed in hospital LDL 79, greater than goal < 70 High intensity statin: On Lipitor 80 Continue statin at discharge         Diabetes type II Unontrolled Home meds:   HgbA1c 7.7, goal < 7.0 CBGs SSI   Other Stroke Risk Factors Advanced Age >/= 11  Prevous Cigarette smoker: 40 pack year  CAD status post stenting Thoracic aortic  aneurysm status post repair and left carotid-subclavian bypass   Other Active Problems    Hospital day # 2  Neurology will sign off. Please call with questions. Pt will follow up with stroke clinic NP at [facility] in about 4 weeks. Thanks for the consult.   Marvel Plan, MD PhD Stroke Neurology 04/06/2021 4:15 PM    To contact Stroke Continuity provider, please refer to [email or web address]. After hours, contact General Neurology

## 2021-04-06 NOTE — TOC CAGE-AID Note (Signed)
Transition of Care [facility]) - CAGE-AID Screening   Patient Details  Name: Archita Lomeli MRN: [id number] Date of Birth: 1940/10/30  [facility]) CM/SW Contact:    Erin Sons, LCSW Phone Number: 04/06/2021, 1:53 PM   Clinical Narrative:  CAGE-AID Completed; score of 0. Pt reports she does not use alcohol or any other substances.   CAGE-AID Screening:    Have You Ever Felt You Ought to Cut Down on Your Drinking or Drug Use?: No Have People Annoyed You By Critizing Your Drinking Or Drug Use?: No Have You Felt Bad Or Guilty About Your Drinking Or Drug Use?: No      Substance Abuse Education Offered: No

## 2021-04-06 NOTE — Progress Notes (Signed)
PROGRESS NOTE    Erin Baker   NID:[id number]  DOB: Sep 04, 1940  PCP: Pcp, No    DOA: 04/04/2021 LOS: 2    Brief Narrative / Hospital Course to Date:   80 year old female with past medical history of CAD status post stenting, thoracic endovascular aneurysm repair, subclavian artery repair, hypertension, diabetes who presented with dysarthria that was noted on the phone while she was talking with a family member.    Noncontrast CT head was unremarkable.  Telemetry neurologist was consulted and once metabolic reasons for dysarthria were ruled out, brain MRI was obtained which showed a small right frontal infarct.  CTA of head and neck did show some carotid stenosis which was confirmed on carotid duplex ultrasound.  Vascular surgery was consulted and plan for intervention on Wednesday 9/14.  Patient has been evaluated by PT and OT, both recommending strict 24-hour supervision and assistance after discharge.  Patient lives alone and daughter out of state.  They are exploring caregiver options.  Assessment & Plan   Principal Problem:   Dysarthria Active Problems:   CAD (coronary artery disease)   Hypertension   Normocytic anemia   Type 2 diabetes mellitus with vascular disease (HCC)   Cerebral embolism with cerebral infarction   Acute ischemic right frontal lobe stroke presenting with dysarthria -deficits and resolved.  Brain MRI also showed numerous chronic supra and infratentorial infarcts of unclear etiology.  Patient had a loop recorder placed in 2019 which reportedly has not showed any A. fib. 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 --Management per stroke team --Telemetry --DAPT with aspirin and Plavix --Loop recorder interrogated and noted battery fully depleted --PT and OT recommend strict 24-hour supervision and assistance after discharge but  no rehab needs.  Supervision needed due to poor executive function, see their notes.  Patient appears to have poor short-term memory and would have difficulty managing her medications etc. unclear if any underlying history of cognitive impairment.  Right ICA stenosis -Vascular surgery consulted, plan for TCAR next Wednesday  History of prior strokes -with multiple chronic infarcts in multiple territories including cerebellar.  Continue work-up for A. Fib.  Otherwise management as above per neuro.  Hypertension -BP stable.  Per neuro, BP goal 130-160 before carotid intervention.  Hyperlipidemia -continue Lipitor 80 mg.  Goal LDL less than 70.  Uncontrolled type 2 diabetes -A1c 7.7%.  Continue sliding scale NovoLog.  ?  Cognitive impairment with poor short-term memory versus acute encephalopathy related to stroke -see OT notes regarding patient's current executive functions. --Requires 24-hour supervision and assistance after discharge from the hospital.  Patient BMI: Body mass index is 27.48 kg/m.   DVT prophylaxis: enoxaparin (LOVENOX) injection 40 mg Start: 04/05/21 1000   Diet:  Diet Orders (From admission, onward)     Start     Ordered   04/05/21 0139  Diet heart healthy/carb modified Room service appropriate? Yes; Fluid consistency: Thin  Diet effective now       Question Answer Comment  Diet-HS Snack? Nothing   Room service appropriate? Yes   Fluid consistency: Thin      04/05/21 0138              Code Status: Full Code   Subjective 04/06/21    Patient awake sitting up in bed when seen today.  She reports feeling very well since her speech is back to normal.  She still feels  sensation is slightly different on the left side of her face and feels like her tongue deviates a bit to the left.  She has no other new or worsening neurologic symptoms.    Discussed with RN regarding patient's ability to manage medications etc. independently at home and she agrees with  assessment PT and OT.  Patient reportedly demonstrates poor short-term memory and executive function at this time.   Disposition Plan & Communication   Status is: Inpatient  Remains inpatient appropriate because:Ongoing diagnostic testing needed not appropriate for outpatient work up.  Carotid procedure planned 9/14.  Patient requires 24-hour supervision assistance and lives alone.  Dispo: The patient is from: Home              Anticipated d/c is to: Home versus SNF depending on 24-hour help available              Patient currently is not medically stable to d/c.   Difficult to place patient No   Family Communication: None at bedside, will attempt to call daughter she is flying down from [place] today.   Consults, Procedures, Significant Events   Consultants:  Neurology Vascular surgery  Procedures:  None  Antimicrobials:  Anti-infectives (From admission, onward)    None         Micro    Objective   Vitals:   04/06/21 0400 04/06/21 0738 04/06/21 1103 04/06/21 1608  BP:  130/63 (!) 108/53 128/68  Pulse: 64 65 64 66  Resp:  17  17  Temp: 97.6 F (36.4 C) 97.6 F (36.4 C)  98 F (36.7 C)  TempSrc: Oral Oral  Oral  SpO2: 96% 95%  97%  Weight:      Height:        Intake/Output Summary (Last 24 hours) at 04/06/2021 1812 Last data filed at 04/06/2021 0400 Gross per 24 hour  Intake 100 ml  Output --  Net 100 ml   Filed Weights   04/04/21 1945 04/04/21 2355  Weight: 74.8 kg 74.9 kg    Physical Exam:  General exam: awake, alert, no acute distress HEENT: moist mucus membranes, hearing grossly normal  Respiratory system: CTAB, no wheezes, rales or rhonchi, normal respiratory effort. Cardiovascular system: normal S1/S2, RRR, no pedal edema.   Gastrointestinal system: soft, NT, ND, no HSM felt, +bowel sounds. Central nervous system: A&O x3.  Slight left tongue deviation otherwise no gross focal neurologic deficits, normal speech Extremities: moves all, no  edema, normal tone Skin: dry, intact, normal temperature Psychiatry: normal mood, congruent affect  Labs   Data Reviewed: I have personally reviewed following labs and imaging studies  CBC: Recent Labs  Lab 04/04/21 1953 04/05/21 0011 04/06/21 0104  WBC 10.8* 9.1 9.3  NEUTROABS 7.1 6.0  --   HGB 11.9* 10.7* 10.5*  HCT 37.6 35.0* 34.5*  MCV 83.2 82.9 82.7  PLT 274 231 231   Basic Metabolic Panel: Recent Labs  Lab 04/04/21 1953 04/05/21 0417 04/06/21 0104  NA 136  --  135  K 4.0  --  4.2  CL 101  --  104  CO2 27  --  23  GLUCOSE 168*  --  226*  BUN 26*  --  22  CREATININE 1.09* 0.98 1.00  CALCIUM 9.4  --  9.0   GFR: Estimated Creatinine Clearance: 47 mL/min (by C-G formula based on SCr of 1 mg/dL). Liver Function Tests: Recent Labs  Lab 04/04/21 1953  AST 17  ALT 10  ALKPHOS 96  BILITOT 0.7  PROT 6.8  ALBUMIN 3.6   No results for input(s): LIPASE, AMYLASE in the last 168 hours. No results for input(s): AMMONIA in the last 168 hours. Coagulation Profile: Recent Labs  Lab 04/04/21 1953  INR 1.1   Cardiac Enzymes: No results for input(s): CKTOTAL, CKMB, CKMBINDEX, TROPONINI in the last 168 hours. BNP (last 3 results) No results for input(s): PROBNP in the last 8760 hours. HbA1C: Recent Labs    04/05/21 0417  HGBA1C 7.7*   CBG: Recent Labs  Lab 04/05/21 1723 04/05/21 2122 04/06/21 0611 04/06/21 1100 04/06/21 1610  GLUCAP 136* 191* 169* 249* 209*   Lipid Profile: Recent Labs    04/05/21 0417  CHOL 137  HDL 34*  LDLCALC 79  TRIG 161  CHOLHDL 4.0   Thyroid Function Tests: No results for input(s): TSH, T4TOTAL, FREET4, T3FREE, THYROIDAB in the last 72 hours. Anemia Panel: No results for input(s): VITAMINB12, FOLATE, FERRITIN, TIBC, IRON, RETICCTPCT in the last 72 hours. Sepsis Labs: No results for input(s): PROCALCITON, LATICACIDVEN in the last 168 hours.  Recent Results (from the past 240 hour(s))  Resp Panel by RT-PCR (Flu A&B,  Covid) Nasopharyngeal Swab     Status: None   Collection Time: 04/04/21  7:53 PM   Specimen: Nasopharyngeal Swab; Nasopharyngeal(NP) swabs in vial transport medium  Result Value Ref Range Status   SARS Coronavirus 2 by RT PCR NEGATIVE NEGATIVE Final    Comment: (NOTE) SARS-CoV-2 target nucleic acids are NOT DETECTED.  The SARS-CoV-2 RNA is generally detectable in upper respiratory specimens during the acute phase of infection. The lowest concentration of SARS-CoV-2 viral copies this assay can detect is 138 copies/mL. A negative result does not preclude SARS-Cov-2 infection and should not be used as the sole basis for treatment or other patient management decisions. A negative result may occur with  improper specimen collection/handling, submission of specimen other than nasopharyngeal swab, presence of viral mutation(s) within the areas targeted by this assay, and inadequate number of viral copies(<138 copies/mL). A negative result must be combined with clinical observations, patient history, and epidemiological information. The expected result is Negative.  Fact Sheet for Patients:  BloggerCourse.com  Fact Sheet for Healthcare Providers:  SeriousBroker.it  This test is no t yet approved or cleared by the [place] FDA and  has been authorized for detection and/or diagnosis of SARS-CoV-2 by FDA under an Emergency Use Authorization (EUA). This EUA will remain  in effect (meaning this test can be used) for the duration of the COVID-19 declaration under Section 564(b)(1) of the Act, 21 U.S.C.section 360bbb-3(b)(1), unless the authorization is terminated  or revoked sooner.       Influenza A by PCR NEGATIVE NEGATIVE Final   Influenza B by PCR NEGATIVE NEGATIVE Final    Comment: (NOTE) The Xpert Xpress SARS-CoV-2/FLU/RSV plus assay is intended as an aid in the diagnosis of influenza from Nasopharyngeal swab specimens and should  not be used as a sole basis for treatment. Nasal washings and aspirates are unacceptable for Xpert Xpress SARS-CoV-2/FLU/RSV testing.  Fact Sheet for Patients: BloggerCourse.com  Fact Sheet for Healthcare Providers: SeriousBroker.it  This test is not yet approved or cleared by the [place] FDA and has been authorized for detection and/or diagnosis of SARS-CoV-2 by FDA under an Emergency Use Authorization (EUA). This EUA will remain in effect (meaning this test can be used) for the duration of the COVID-19 declaration under Section 564(b)(1) of the Act, 21 U.S.C. section 360bbb-3(b)(1), unless the authorization is  terminated or revoked.  Performed at [facility], [address] [place], [place] [address]   Culture, blood (single)     Status: None (Preliminary result)   Collection Time: 04/06/21  1:04 AM   Specimen: BLOOD RIGHT HAND  Result Value Ref Range Status   Specimen Description BLOOD RIGHT HAND  Final   Special Requests   Final    BOTTLES DRAWN AEROBIC AND ANAEROBIC Blood Culture adequate volume   Culture   Final    NO GROWTH < 12 HOURS Performed at [facility], [address] [place], [place] [address]    Report Status PENDING  Incomplete      Imaging Studies   CT Angio Head W or Wo Contrast  Result Date: 04/04/2021 CLINICAL DATA:  Initial evaluation for acute stroke. EXAM: CT ANGIOGRAPHY HEAD AND NECK TECHNIQUE: Multidetector CT imaging of the head and neck was performed using the standard protocol during bolus administration of intravenous contrast. Multiplanar CT image reconstructions and MIPs were obtained to evaluate the vascular anatomy. Carotid stenosis measurements (when applicable) are obtained utilizing NASCET criteria, using the distal internal carotid diameter as the denominator. CONTRAST:  74mL OMNIPAQUE IOHEXOL 350 MG/ML SOLN COMPARISON:  Prior head CT from earlier the same day.  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 Origin of the innominate artery is markedly irregular without high-grade stenosis. 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 Left common to subclavian artery graft in place with perfusion of the left subclavian artery distally. Widely patent flow through the graft. Right carotid system: Right CCA patent to the bifurcation without stenosis. Bulky calcified plaque about the right carotid bulb with associated 50% stenosis by NASCET criteria. Right ICA widely patent distally. Left carotid system: Left CCA patent from its origin to the bifurcation without stenosis. Bulky calcified plaque about the left carotid bulb/proximal left ICA with associated 40% stenosis by NASCET criteria. Left ICA widely patent distally. Vertebral arteries: Both vertebral arteries arise from subclavian arteries. Vertebral arteries widely patent within the neck without stenosis or other abnormality. Skeleton: No visible discrete osseous lesions. Other neck: Postsurgical changes present within the left neck. Upper chest: Cardiomegaly, partially visualized. Review of the MIP images confirms the above findings CTA HEAD FINDINGS Anterior circulation: Petrous segments patent bilaterally. Scattered atheromatous change throughout the carotid siphons with associated moderate multifocal narrowing. A1 segments patent bilaterally. Normal anterior communicating artery complex. Anterior cerebral arteries widely patent bilaterally. No M1 stenosis or occlusion. Distal MCA branches well perfused and symmetric. Posterior circulation: Both V4 segments patent to the vertebrobasilar junction without stenosis. Both PICA origins patent and normal. Basilar widely patent to its distal aspect without stenosis. Superior cerebellar arteries patent bilaterally. Both PCAs primarily supplied via the  basilar and are well perfused to there distal aspects. Venous sinuses: Patent allowing for timing the contrast bolus. Anatomic variants: None significant.  No visible aneurysm. Review of the MIP images confirms the above findings IMPRESSION: 1. Negative CTA for large vessel occlusion. 2. 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 3. 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 Widely patent flow through the graft. 4. 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 5.  Cardiomegaly. Electronically Signed   By: Rise Mu M.D.   On: 04/04/2021 22:32   CT HEAD WO CONTRAST  Result Date: 04/04/2021 CLINICAL DATA:  Neuro deficit, acute, stroke suspected EXAM: CT HEAD WITHOUT CONTRAST TECHNIQUE: Contiguous axial images were obtained from the base of the skull through the vertex without intravenous contrast. COMPARISON:  None. FINDINGS: Brain: There is normal anatomic configuration of the brain. Mild parenchymal volume loss is commensurate with the patient's age. Mild periventricular white matter changes are present likely reflecting the sequela of small vessel ischemia. There are areas of encephalomalacia involving the cerebellar hemispheres bilaterally, right temporoparietal cortex, inferior right frontal lobe, and left high frontoparietal cortex possibly the sequela of remote infarction. Additional lacunar infarcts are noted within the right lentiform nucleus, right caudate head, left corona radiata, right pons, and cerebellum. No definite evidence of acute intracranial infarct. No acute hemorrhage. No abnormal mass effect or midline shift. No intra or extra-axial mass lesion. Ventricular size is normal. Vascular: No asymmetric hyperdense  vasculature at the skull base. Extensive atherosclerotic calcification within the carotid siphons. Skull: Intact Sinuses/Orbits: The visualized paranasal sinuses are clear. The orbits are unremarkable. Other: Mastoid air cells and middle ear cavities are clear. IMPRESSION: No acute intracranial abnormality identified. Multiple supratentorial and infratentorial foci of encephalomalacia, likely representing multiple infarcts in both anterior and posterior circulatory territories. This may reflect the sequela of a central embolic source. Mild senescent change. Electronically Signed   By: Helyn Numbers M.D.   On: 04/04/2021 20:47   CT Angio Neck W and/or Wo Contrast  Result Date: 04/04/2021 CLINICAL DATA:  Initial evaluation for acute stroke. EXAM: CT ANGIOGRAPHY HEAD AND NECK TECHNIQUE: Multidetector CT imaging of the head and neck was performed using the standard protocol during bolus administration of intravenous contrast. Multiplanar CT image reconstructions and MIPs were obtained to evaluate the vascular anatomy. Carotid stenosis measurements (when applicable) are obtained utilizing NASCET criteria, using the distal internal carotid diameter as the denominator. CONTRAST:  33mL OMNIPAQUE IOHEXOL 350 MG/ML SOLN COMPARISON:  Prior head CT from earlier the same day. 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 Origin of the innominate artery is markedly irregular without high-grade stenosis. 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 Left common to subclavian artery graft in place with perfusion of the left subclavian artery distally. Widely patent flow through the graft. Right carotid system: Right CCA patent to the bifurcation without stenosis. Bulky calcified plaque about the right carotid bulb with associated 50% stenosis by NASCET criteria. Right ICA widely patent distally. Left  carotid system: Left CCA patent from its origin to the bifurcation without stenosis. Bulky calcified plaque about the left carotid bulb/proximal left ICA with associated 40% stenosis by NASCET criteria. Left ICA widely patent distally. Vertebral arteries: Both vertebral arteries arise from subclavian arteries. Vertebral arteries widely patent within the neck without stenosis or other abnormality. Skeleton: No visible discrete osseous lesions. Other neck: Postsurgical changes present within the left neck. Upper chest: Cardiomegaly, partially visualized. Review of the MIP images confirms the above findings CTA HEAD FINDINGS Anterior circulation: Petrous segments patent bilaterally. Scattered atheromatous change throughout the carotid siphons with associated moderate multifocal narrowing. A1 segments patent bilaterally. Normal anterior communicating artery complex. Anterior cerebral arteries widely patent bilaterally. No M1 stenosis or occlusion. Distal MCA branches well perfused and symmetric. Posterior circulation: Both V4 segments patent to the vertebrobasilar junction without stenosis. Both PICA  origins patent and normal. Basilar widely patent to its distal aspect without stenosis. Superior cerebellar arteries patent bilaterally. Both PCAs primarily supplied via the basilar and are well perfused to there distal aspects. Venous sinuses: Patent allowing for timing the contrast bolus. Anatomic variants: None significant.  No visible aneurysm. Review of the MIP images confirms the above findings IMPRESSION: 1. Negative CTA for large vessel occlusion. 2. 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 3. 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 Widely patent flow through the graft. 4. Aneurysmal dilatation of the  intrathoracic aorta with sequelae of prior stent endograft repair, partially visualized. 5. Cardiomegaly. Electronically Signed   By: Rise Mu M.D.   On: 04/04/2021 22:32   MR BRAIN WO CONTRAST  Result Date: 04/05/2021 CLINICAL DATA:  Neuro deficit, acute, stroke suspected. EXAM: MRI HEAD WITHOUT CONTRAST TECHNIQUE: Multiplanar, multiecho pulse sequences of the brain and surrounding structures were obtained without intravenous contrast. COMPARISON:  Head CT 04/04/2021 FINDINGS: Brain: There is a 1.2 cm acute cortical/subcortical infarct in the posterior right frontal lobe. Small to moderate-sized chronic infarcts are scattered throughout both cerebral hemispheres involving both MCA and PCA territories, and there are also numerous chronic bilateral cerebellar infarcts. Chronic lacunar infarcts are also noted in the basal ganglia bilaterally, right thalamus, and pons. Chronic microhemorrhages are present in the right corona radiata and right cerebellar hemisphere. There is mild cerebral atrophy. No mass, midline shift, or extra-axial fluid collection is identified. Vascular: Major intracranial vascular flow voids are preserved. Skull and upper cervical spine: Unremarkable bone marrow signal. Sinuses/Orbits: Bilateral cataract extraction. Mild mucosal thickening in the maxillary sinuses. Trace bilateral mastoid fluid. Other: None. IMPRESSION: 1. Small acute right frontal infarct. 2. Numerous chronic supratentorial and infratentorial infarcts. Electronically Signed   By: Sebastian Ache M.D.   On: 04/05/2021 12:57   DG CHEST PORT 1 VIEW  Result Date: 04/05/2021 CLINICAL DATA:  Pre MRI. Metal in chest. History of a stroke. EXAM: PORTABLE CHEST 1 VIEW COMPARISON:  None. FINDINGS: Thoracic aortic stent graft noted. There also multiple vascular clips in left supraclavicular region. A loop recorder is noted on left side. The heart is mildly enlarged. Elevation of the left hemidiaphragm. No acute pulmonary  findings. The bony thorax is intact. IMPRESSION: 1. Thoracic aortic stent graft noted. 2. No acute cardiopulmonary findings. Electronically Signed   By: Rudie Meyer M.D.   On: 04/05/2021 06:50   ECHOCARDIOGRAM COMPLETE  Result Date: 04/05/2021    ECHOCARDIOGRAM REPORT   Patient Name:   Erin Baker Date of Exam: 04/05/2021 Medical Rec #:  [id number]    Height:       65.0 in Accession #:    [id number]   Weight:       165.1 lb Date of Birth:  1940-08-14    BSA:          1.823 m Patient Age:    80 years     BP:           102/77 mmHg Patient Gender: F            HR:           66 bpm. Exam Location:  Inpatient Procedure: 3D Echo, 2D Echo, Cardiac Doppler and Color Doppler Indications:    Stroke  History:  Patient has no prior history of Echocardiogram examinations.                 CAD, Stroke; Risk Factors:Diabetes and Current Smoker.  Sonographer:    Sheralyn Boatmanina West RDCS Referring Phys: [phone] Meryle ReadyRSHAD N [facility] LLCKAKRAKANDY  Sonographer Comments: Technically difficult study due to poor echo windows. IMPRESSIONS  1. Left ventricular ejection fraction, by estimation, is 60 to 65%. The left ventricle has normal function. The left ventricle has no regional wall motion abnormalities. There is moderate asymmetric left ventricular hypertrophy of the posterior segment.  Left ventricular diastolic parameters are consistent with Grade I diastolic dysfunction (impaired relaxation).  2. Right ventricular systolic function is normal. The right ventricular size is normal.  3. The mitral valve is normal in structure. No evidence of mitral valve regurgitation. No evidence of mitral stenosis.  4. The aortic valve is normal in structure. Aortic valve regurgitation is not visualized. No aortic stenosis is present.  5. Aortic dilatation noted. Aneurysm of the ascending aorta, measuring 50 mm. There is borderline dilatation of the aortic root, measuring 37 mm. There is multilobular protruding plaque involving the ascending aorta and descending aorta.   6. The inferior vena cava is normal in size with greater than 50% respiratory variability, suggesting right atrial pressure of 3 mmHg.  7. Recommend Chest and Abdominal CTA for further assessment of aortic aneurysm as well as protruding plaque in the thoracic aorta which may be a source of stroke. FINDINGS  Left Ventricle: Left ventricular ejection fraction, by estimation, is 60 to 65%. The left ventricle has normal function. The left ventricle has no regional wall motion abnormalities. The left ventricular internal cavity size was normal in size. There is  moderate asymmetric left ventricular hypertrophy of the posterior segment. Left ventricular diastolic parameters are consistent with Grade I diastolic dysfunction (impaired relaxation). Normal left ventricular filling pressure. Right Ventricle: The right ventricular size is normal. No increase in right ventricular wall thickness. Right ventricular systolic function is normal. Left Atrium: Left atrial size was normal in size. Right Atrium: Right atrial size was normal in size. Pericardium: There is no evidence of pericardial effusion. Mitral Valve: The mitral valve is normal in structure. No evidence of mitral valve regurgitation. No evidence of mitral valve stenosis. Tricuspid Valve: The tricuspid valve is normal in structure. Tricuspid valve regurgitation is not demonstrated. No evidence of tricuspid stenosis. Aortic Valve: The aortic valve is normal in structure. Aortic valve regurgitation is not visualized. No aortic stenosis is present. Pulmonic Valve: The pulmonic valve was normal in structure. Pulmonic valve regurgitation is trivial. No evidence of pulmonic stenosis. Aorta: Aortic dilatation noted. There is borderline dilatation of the aortic root, measuring 37 mm. 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 There is multilobular plaque involving the ascending aorta and descending aorta. Venous: The inferior vena cava is normal in  size with greater than 50% respiratory variability, suggesting right atrial pressure of 3 mmHg. IAS/Shunts: No atrial level shunt detected by color flow Doppler.  LEFT VENTRICLE PLAX 2D LVIDd:         4.10 cm     Diastology LVIDs:         3.00 cm     LV e' medial:    8.70 cm/s LV PW:         1.40 cm     LV E/e' medial:  6.5 LV IVS:        1.10 cm     LV e'  lateral:   6.74 cm/s LVOT diam:     2.00 cm     LV E/e' lateral: 8.4 LV SV:         50 LV SV Index:   28 LVOT Area:     3.14 cm  LV Volumes (MOD) LV vol d, MOD A2C: 61.4 ml LV vol d, MOD A4C: 56.3 ml LV vol s, MOD A2C: 28.6 ml LV vol s, MOD A4C: 21.9 ml LV SV MOD A2C:     32.8 ml LV SV MOD A4C:     56.3 ml LV SV MOD BP:      33.8 ml RIGHT VENTRICLE            IVC RV S prime:     7.94 cm/s  IVC diam: 2.30 cm TAPSE (M-mode): 1.2 cm LEFT ATRIUM             Index       RIGHT ATRIUM          Index LA diam:        3.10 cm 1.70 cm/m  RA Area:     7.24 cm LA Vol (A2C):   18.6 ml 10.20 ml/m RA Volume:   12.10 ml 6.64 ml/m LA Vol (A4C):   20.2 ml 11.08 ml/m LA Biplane Vol: 19.5 ml 10.69 ml/m  AORTIC VALVE LVOT Vmax:   63.20 cm/s LVOT Vmean:  42.300 cm/s LVOT VTI:    0.160 m  AORTA Ao Root diam: 3.70 cm Ao Asc diam:  4.25 cm MITRAL VALVE MV Area (PHT): 2.45 cm    SHUNTS MV Decel Time: 310 msec    Systemic VTI:  0.16 m MV E velocity: 56.60 cm/s  Systemic Diam: 2.00 cm MV A velocity: 80.70 cm/s MV E/A ratio:  0.70 Armanda Magic MD Electronically signed by Armanda Magic MD Signature Date/Time: 04/05/2021/2:55:04 PM    Final    VAS US CAROTID  Result Date: 04/06/2021 Carotid Arterial Duplex Study Patient Name:  Andrey Spearman  Date of Exam:   04/06/2021 Medical Rec #: [id number]     Accession #:    [id number] Date of Birth: Jan 03, 1941     Patient Gender: F Patient Age:   63 years Exam Location:  [facility] Procedure:      VAS US CAROTID Referring Phys: Sherald Hess --------------------------------------------------------------------------------  Indications:        CVA, Speech disturbance and facial droop. CTA indicates 50%                    right ICA stenosis and 40% left ICA stenosis. Risk Factors:      Hypertension, Diabetes, past history of smoking, coronary                    artery disease, prior CVA. 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 Loop recorder. Comparison Study:  No prior study Performing Technologist: Sherren Kerns RVS  Examination Guidelines: A complete evaluation includes B-mode imaging, spectral Doppler, color Doppler, and power Doppler as needed of all accessible portions of each vessel. Bilateral testing is considered an integral part of a complete examination. Limited examinations for reoccurring indications may be performed as noted.  Right Carotid Findings: +----------+--------+--------+--------+------------------+------------------+           PSV cm/sEDV cm/sStenosisPlaque DescriptionComments           +----------+--------+--------+--------+------------------+------------------+  CCA Prox  47      11                                intimal thickening +----------+--------+--------+--------+------------------+------------------+ CCA Distal42      14                                intimal thickening +----------+--------+--------+--------+------------------+------------------+ ICA Prox  201     69      60-79%  calcific          Shadowing          +----------+--------+--------+--------+------------------+------------------+ ICA Mid   123     38                                                   +----------+--------+--------+--------+------------------+------------------+ ICA Distal55      20                                                   +----------+--------+--------+--------+------------------+------------------+ ECA       117     12                                                    +----------+--------+--------+--------+------------------+------------------+ +----------+--------+-------+--------+-------------------+           PSV cm/sEDV cmsDescribeArm Pressure (mmHG) +----------+--------+-------+--------+-------------------+ [id number]                                        +----------+--------+-------+--------+-------------------+ +---------+--------+--+--------+--+ VertebralPSV cm/s32EDV cm/s11 +---------+--------+--+--------+--+  Left Carotid Findings: +----------+--------+--------+--------+---------------------+------------------+           PSV cm/sEDV cm/sStenosisPlaque Description   Comments           +----------+--------+--------+--------+---------------------+------------------+ CCA Prox  100     10                                   intimal thickening +----------+--------+--------+--------+---------------------+------------------+ CCA Distal91      11                                   intimal thickening +----------+--------+--------+--------+---------------------+------------------+ ICA Prox  119     34      1-39%   calcific and         Shadowing                                            irregular                               +----------+--------+--------+--------+---------------------+------------------+ ICA Distal86  34                                                      +----------+--------+--------+--------+---------------------+------------------+ ECA       104     11                                                      +----------+--------+--------+--------+---------------------+------------------+ +----------+--------+--------+--------+-------------------+           PSV cm/sEDV cm/sDescribeArm Pressure (mmHG) +----------+--------+--------+--------+-------------------+ Subclavian171                                         +----------+--------+--------+--------+-------------------+  +---------+--------+--+--------+--+ VertebralPSV cm/s58EDV cm/s12 +---------+--------+--+--------+--+   Summary: Right Carotid: 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 Left Carotid: Velocities in the left ICA are consistent with a 1-39% stenosis. Vertebrals:  Bilateral vertebral arteries demonstrate antegrade flow. Subclavians: Normal flow hemodynamics were seen in bilateral subclavian              arteries. *See table(s) above for measurements and observations.     Preliminary      Medications   Scheduled Meds:  aspirin EC  81 mg Oral Daily   atorvastatin  80 mg Oral Daily   clopidogrel  75 mg Oral Daily   enoxaparin (LOVENOX) injection  40 mg Subcutaneous Q24H   insulin aspart  0-9 Units Subcutaneous TID WC   insulin detemir  30 Units Subcutaneous QHS   metoprolol tartrate  25 mg Oral BID   sertraline  50 mg Oral Daily   Continuous Infusions:     LOS: 2 days    Time spent: 30 minutes    Pennie Banter, DO [facility]  04/06/2021, 6:12 PM      If 7PM-7AM, please contact night-coverage. How to contact the [facility] [place] Attending or Consulting provider 7A - 7P or covering provider during after hours 7P -7A, for this patient?    Check the care team in [facility] and look for a) attending/consulting TR[facility] provider listed and b) the [facility] team listed Log into [email or web address] and use [facility]'s universal password to access. If you do not have the password, please contact the hospital operator. Locate the [facility] provider you are looking for under [facility] and page to a number that you can be directly reached. If you still have difficulty reaching the provider, please page the [facility] (Director on Call) for the Hospitalists listed on amion for assistance.

## 2021-04-06 NOTE — Plan of Care (Signed)

## 2021-04-06 NOTE — Progress Notes (Signed)
Physical Therapy Treatment Patient Details Name: Erin Baker MRN: [id number] DOB: 1941-06-05 Today's Date: 04/06/2021    History of Present Illness 80 y.o. female presenting to ED 9/6 with dysarthria. CT head (-) for acute findings. CT angio (+) plaque around carotic bifurcation. MRI (+) small acute R frontal infarct and numerous chronic supratentorial and infratentorial infarcts. PMHx significant for CAD s/p stenting, Hx of CVA x5, thoracic endovascular aneurysm s/p repair,  subclavian artery repair, HTN, DMII and Hx of tobacco use.    PT Comments    The pt was seen for continued progression of OOB mobility and activity. She was able to complete bed mobility and sit-stand transfers with little-to-no physical assist at this time. The pt remains most limited by deficits in stability that persist with ambulation, often requiring minA to recover.The pt's situation is further complicated by deficits in safety awareness that require supervision and verbal cues to improve safety and decision making with mobility. The pt will continue to benefit from skilled PT acutely and following d/c to progress dynamic stability, awareness, and endurance.    Follow Up Recommendations  Supervision/Assistance - 24 hour (will require near-constant supervision for safety and benefit from OPPT if returning home)     Equipment Recommendations  3in1 (PT) (rollator)    Recommendations for Other Services       Precautions / Restrictions Precautions Precautions: Fall Precaution Comments: Decreased awareness Restrictions Weight Bearing Restrictions: No    Mobility  Bed Mobility Overal bed mobility: Needs Assistance Bed Mobility: Supine to Sit     Supine to sit: Supervision     General bed mobility comments: pt with no assist needed    Transfers Overall transfer level: Needs assistance Equipment used: None Transfers: Sit to/from Stand Sit to Stand: Supervision         General transfer comment:  for safety, pt with slight instability on initial stand, no physical assist given  Ambulation/Gait Ambulation/Gait assistance: Min assist Gait Distance (Feet): 150 Feet Assistive device: None Gait Pattern/deviations: Step-through pattern;Decreased stride length;Antalgic;Drifts right/left Gait velocity: decr   General Gait Details: pt with intermittent drifting R and L, minA to steady. pt with no overt LOB, but increased assist needed with anyquick turns or head movements. pt with significant fatigue requiring standing rest break and UE support after 75 ft, then needing repeated standing rest breaks every 10-15 ft. Pt reaching for UE support after 75 ft of ambulation, will need AD for greater independence   Modified Rankin (Stroke Patients Only) Modified Rankin (Stroke Patients Only) Pre-Morbid Rankin Score: No significant disability Modified Rankin: Moderately severe disability     Balance Overall balance assessment: Needs assistance Sitting-balance support: Feet supported Sitting balance-[name] Scale: Good     Standing balance support: No upper extremity supported;During functional activity;Single extremity supported Standing balance-[name] Scale: Fair Standing balance comment: pt progressed from needing no UE support to BUE support with gait due to fatigue and subsequent decrease in stability.                            Cognition Arousal/Alertness: Awake/alert Behavior During Therapy: WFL for tasks assessed/performed Overall Cognitive Status: Impaired/Different from baseline Area of Impairment: Attention;Safety/judgement;Awareness;Problem solving                   Current Attention Level: Sustained     Safety/Judgement: Decreased awareness of safety;Decreased awareness of deficits Awareness: Emergent Problem Solving: Slow processing;Requires verbal cues General Comments: pt lacks awareness of  deficits, especially as they impact her ability to safely  complete mobility and self care at home. when prompted, pt stating "well that would be hard" but no problem solving to determine safe manner of completing tasks.      Exercises      General Comments General comments (skin integrity, edema, etc.): VSS onRA      Pertinent Vitals/Pain Pain Assessment: Faces Faces Pain Scale: Hurts little more Pain Location: low back, L hip Pain Descriptors / Indicators: Discomfort;Sore Pain Intervention(s): Limited activity within patient's tolerance;Repositioned     PT Goals (current goals can now be found in the care plan section) Acute Rehab PT Goals Patient Stated Goal: To return home independently PT Goal Formulation: With patient Time For Goal Achievement: 04/19/21 Potential to Achieve Goals: Good Progress towards PT goals: Progressing toward goals    Frequency    Min 4X/week      PT Plan Discharge plan needs to be updated       AM-PAC PT "6 Clicks" Mobility   Outcome Measure  Help needed turning from your back to your side while in a flat bed without using bedrails?: None Help needed moving from lying on your back to sitting on the side of a flat bed without using bedrails?: None Help needed moving to and from a bed to a chair (including a wheelchair)?: A Little Help needed standing up from a chair using your arms (e.g., wheelchair or bedside chair)?: A Little Help needed to walk in hospital room?: A Little Help needed climbing 3-5 steps with a railing? : A Lot 6 Click Score: 19    End of Session Equipment Utilized During Treatment: Gait belt Activity Tolerance: Patient limited by fatigue Patient left: in chair;with call bell/phone within reach;with chair alarm set Nurse Communication: Mobility status PT Visit Diagnosis: Other abnormalities of gait and mobility (R26.89);Difficulty in walking, not elsewhere classified (R26.2)     Time: 3419-6222 PT Time Calculation (min) (ACUTE ONLY): 25 min  Charges:  $Gait Training:  8-22 mins $Therapeutic Activity: 8-22 mins                     Vickki Muff, PT, DPT   Acute Rehabilitation Department Pager #: [phone]   Ronnie Derby 04/06/2021, 9:17 AM

## 2021-04-06 NOTE — Progress Notes (Addendum)
[facility] [place]  Subjective  -no additional neurologic events overnight.   Objective (!) 108/53 64 97.6 F (36.4 C) (Oral) 17 95%  Intake/Output Summary (Last 24 hours) at 04/06/2021 1433 Last data filed at 04/06/2021 0400 Gross per 24 hour  Intake 284.79 ml  Output --  Net 284.79 ml    Grossly neurologically intact but appreciable speech deficit  Laboratory Lab Results: Recent Labs    04/05/21 0011 04/06/21 0104  WBC 9.1 9.3  HGB 10.7* 10.5*  HCT 35.0* 34.5*  PLT 231 231   BMET Recent Labs    04/04/21 1953 04/05/21 0417 04/06/21 0104  NA 136  --  135  K 4.0  --  4.2  CL 101  --  104  CO2 27  --  23  GLUCOSE 168*  --  226*  BUN 26*  --  22  CREATININE 1.09* 0.98 1.00  CALCIUM 9.4  --  9.0    COAG Lab Results  Component Value Date   INR 1.1 04/04/2021   No results found for: PTT  Assessment/Planning:   80 year old female that vascular surgery was consulted yesterday for symptomatic right carotid stenosis with small right brain stroke.  I ordered a carotid duplex that shows 60 to 79% right ICA stenosis.  I have recommended right carotid intervention and I think a right TCAR would be appropriate.  Discussed guidelines are for carotid intervention if >50% carotid stenosis.  Goal is for stroke risk reduction long term.    I have scheduled her for right TCAR next Wednesday 9/14 with me in the hybrid OR.  She will need to remain on aspirin statin Plavix from my standpoint.  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  I think we would follow this clinically and treat her carotid stenosis first to see if she has any recurrent events.  The innominate lesion would be much more complex to address surgically.  I have updated the patient and her daughter by  phone.  Cephus Shelling, MD [facility] Office: [phone]    Cephus Shelling 04/06/2021 2:33 PM --

## 2021-04-07 DIAGNOSIS — R471 Dysarthria and anarthria: Secondary | ICD-10-CM | POA: Diagnosis not present

## 2021-04-07 LAB — GLUCOSE, CAPILLARY
Glucose-Capillary: 133 mg/dL — ABNORMAL HIGH (ref 70–99)
Glucose-Capillary: 188 mg/dL — ABNORMAL HIGH (ref 70–99)
Glucose-Capillary: 259 mg/dL — ABNORMAL HIGH (ref 70–99)

## 2021-04-07 NOTE — Care Management Important Message (Signed)
Important Message  Patient Details  Name: Erin Baker MRN: [id number] Date of Birth: November 21, 1940   Medicare Important Message Given:  Yes     Mattalynn Crandle 04/07/2021, 4:20 PM

## 2021-04-07 NOTE — Progress Notes (Signed)
Physical Therapy Treatment Patient Details Name: Erin Baker MRN: [id number] DOB: [birth year] Today's Date: 04/07/2021    History of Present Illness 80 y.o. female presenting to ED 9/6 with dysarthria. CT head (-) for acute findings. CT angio (+) plaque around carotic bifurcation. Did not pass stroke swallow test. MRI 9/7 with results pending. PMHx significant for CAD s/p stenting, Hx of CVA x5, thoracic endovascular aneurysm s/p repair,  subclavian artery repair, HTN, DMII and Hx of tobacco use.    PT Comments    Pt continues to benefit from assistance at d/c.  She has been showing improvement but balance and endurance remain to hinder her progress.     Follow Up Recommendations  Supervision/Assistance - 24 hour     Equipment Recommendations       Recommendations for Other Services       Precautions / Restrictions Precautions Precautions: Fall Precaution Comments: Decreased awareness/insight; emotionally labile Restrictions Weight Bearing Restrictions: No    Mobility  Bed Mobility Overal bed mobility: Needs Assistance Bed Mobility: Supine to Sit;Sit to Supine Rolling: Supervision Sidelying to sit: Supervision       General bed mobility comments: Increased time and effort but no assistance needed.    Transfers Overall transfer level: Needs assistance Equipment used: Rolling [place] (2 wheeled) Transfers: Sit to/from Stand Sit to Stand: Supervision         General transfer comment: Cues for hand placement.  Ambulation/Gait Ambulation/Gait assistance: Min guard Gait Distance (Feet): 150 Feet (x2 (seated break between trials due to fatigue)) Assistive device: Rolling [place] (2 wheeled) Gait Pattern/deviations: Step-through pattern;Decreased stride length;Antalgic;Drifts right/left Gait velocity: decr   General Gait Details: Pt continues to present with drift but no overt LOB.  Pt does fatigue rather quickly and requires cues for pacing.   Stairs Stairs:  Yes Stairs assistance: Min guard Stair Management: One rail Left Number of Stairs: 3 General stair comments: Cues for sequencing and safety.  heavy reliance of rail for balance.   Wheelchair Mobility    Modified Rankin (Stroke Patients Only) Modified Rankin (Stroke Patients Only) Pre-Morbid Rankin Score: No significant disability Modified Rankin: Moderately severe disability     Balance Overall balance assessment: Needs assistance Sitting-balance support: Feet supported Sitting balance-[name] Scale: Good       Standing balance-[name] Scale: Fair                              Cognition Arousal/Alertness: Awake/alert Behavior During Therapy: WFL for tasks assessed/performed Overall Cognitive Status: Within Functional Limits for tasks assessed                                        Exercises      General Comments        Pertinent Vitals/Pain Pain Assessment: No/denies pain    Home Living                      Prior Function            PT Goals (current goals can now be found in the care plan section) Acute Rehab PT Goals Patient Stated Goal: To return home independently Potential to Achieve Goals: Good Progress towards PT goals: Progressing toward goals    Frequency    Min 4X/week      PT Plan Current plan remains appropriate  Co-evaluation              AM-PAC PT "6 Clicks" Mobility   Outcome Measure                   End of Session Equipment Utilized During Treatment: Gait belt Activity Tolerance: Patient limited by fatigue Patient left: in chair;with call bell/phone within reach;with chair alarm set Nurse Communication: Mobility status PT Visit Diagnosis: Other abnormalities of gait and mobility (R26.89);Difficulty in walking, not elsewhere classified (R26.2)     Time: 3474-2595 PT Time Calculation (min) (ACUTE ONLY): 22 min  Charges:  $Gait Training: 8-22 mins                      Bonney Leitz , PTA Acute Rehabilitation Services Pager [phone] Office [phone]    Lekeya Rollings Artis Delay 04/07/2021, 7:12 PM

## 2021-04-07 NOTE — Progress Notes (Addendum)
  Progress Note    04/07/2021 7:12 AM Hospital Day 2  Subjective:  no complaints; says she is moving everything normally.  Expresses she is supposed to take 3 pills every day before surgery, plavix asa and statin.    afebrile  Vitals:   04/06/21 2332 04/07/21 0402  BP: 111/64 124/82  Pulse: 70 68  Resp: 17 16  Temp: 98.4 F (36.9 C) 98 F (36.7 C)  SpO2: 96% 99%    Physical Exam: General:  no distress Lungs:  non labored Extremities:  moving all equally  CBC    Component Value Date/Time   WBC 9.3 04/06/2021 0104   RBC 4.17 04/06/2021 0104   HGB 10.5 (L) 04/06/2021 0104   HCT 34.5 (L) 04/06/2021 0104   PLT 231 04/06/2021 0104   MCV 82.7 04/06/2021 0104   MCH 25.2 (L) 04/06/2021 0104   MCHC 30.4 04/06/2021 0104   RDW 15.7 (H) 04/06/2021 0104   LYMPHSABS 1.9 04/05/2021 0011   MONOABS 0.9 04/05/2021 0011   EOSABS 0.2 04/05/2021 0011   BASOSABS 0.0 04/05/2021 0011    BMET    Component Value Date/Time   NA 135 04/06/2021 0104   K 4.2 04/06/2021 0104   CL 104 04/06/2021 0104   CO2 23 04/06/2021 0104   GLUCOSE 226 (H) 04/06/2021 0104   BUN 22 04/06/2021 0104   CREATININE 1.00 04/06/2021 0104   CALCIUM 9.0 04/06/2021 0104   GFRNONAA 58 (L) 04/06/2021 0104    INR    Component Value Date/Time   INR 1.1 04/04/2021 1953     Intake/Output Summary (Last 24 hours) at 04/07/2021 1464 Last data filed at 04/07/2021 3142 Gross per 24 hour  Intake 240 ml  Output 600 ml  Net -360 ml     Assessment/Plan:  80 y.o. female for right TCAR next week Hospital Day 2  -no new events -for TCAR on Wednesday.  Continue statin/asa/plavix.   [name], PA-C [facility] [phone] 04/07/2021 7:12 AM   I have seen and evaluated the patient. I agree with the PA note as documented above.  Plan right TCAR on Wednesday with me in the hybrid OR.  Symptomatic moderate 60 to 79% stenosis in the right ICA. I discussed surgery with her daughter yesterday on  the phone as well.  Continue aspirin Plavix and statin.  Please call vascular over the weekend if questions or concerns.  Cephus Shelling, MD [facility] [place] Office: [phone]

## 2021-04-07 NOTE — Care Management Obs Status (Signed)
MEDICARE OBSERVATION STATUS NOTIFICATION   Patient Details  Name: Erin Baker MRN: [id number] Date of Birth: [birth year]   Medicare Observation Status Notification Given:       Dorena Bodo 04/07/2021, 4:20 PM

## 2021-04-07 NOTE — Progress Notes (Signed)
PROGRESS NOTE    Erin Baker   OYD:[id number]  DOB: 1941/07/18  PCP: Pcp, No    DOA: 04/04/2021 LOS: 3    Brief Narrative / Hospital Course to Date:   80 year old female with past medical history of CAD status post stenting, thoracic endovascular aneurysm repair, subclavian artery repair, hypertension, diabetes who presented with dysarthria that was noted on the phone while she was talking with a family member.    Noncontrast CT head was unremarkable.  Telemetry neurologist was consulted and once metabolic reasons for dysarthria were ruled out, brain MRI was obtained which showed a small right frontal infarct.  CTA of head and neck did show some carotid stenosis which was confirmed on carotid duplex ultrasound.  Vascular surgery was consulted and plan for intervention on Wednesday 9/14.  Patient has been evaluated by PT and OT, both recommending strict 24-hour supervision and assistance after discharge.  Patient lives alone and daughter out of state.  They are exploring caregiver options.  Assessment & Plan   Principal Problem:   Dysarthria Active Problems:   CAD (coronary artery disease)   Hypertension   Normocytic anemia   Type 2 diabetes mellitus with vascular disease (HCC)   Cerebral embolism with cerebral infarction   Acute ischemic right frontal lobe stroke presenting with dysarthria -deficits and resolved.  Brain MRI also showed numerous chronic supra and infratentorial infarcts of unclear etiology.  Patient had a loop recorder placed in 2019 which reportedly has not showed any A. fib. 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 --Management per stroke team --Telemetry --DAPT with aspirin and Plavix --Loop recorder interrogated and noted battery fully depleted --PT and OT recommend strict 24-hour supervision and assistance after discharge but  no rehab needs.  Supervision needed due to poor executive function, see their notes.  Patient appears to have poor short-term memory and would have difficulty managing her medications etc. unclear if any underlying history of cognitive impairment.  Right ICA stenosis -Vascular surgery consulted, plan for TCAR next Wednesday  History of prior strokes -with multiple chronic infarcts in multiple territories including cerebellar.  Continue work-up for A. Fib.  Otherwise management as above per neuro.  Hypertension -BP stable.  Per neuro, BP goal 130-160 before carotid intervention.  Hyperlipidemia -continue Lipitor 80 mg.  Goal LDL less than 70.  Uncontrolled type 2 diabetes -A1c 7.7%.  Continue sliding scale NovoLog.  ?  Cognitive impairment with poor short-term memory versus acute encephalopathy related to stroke -see OT notes regarding patient's current executive functions. --Requires 24-hour supervision and assistance after discharge from the hospital.  Patient BMI: Body mass index is 27.48 kg/m.   DVT prophylaxis: enoxaparin (LOVENOX) injection 40 mg Start: 04/05/21 1000   Diet:  Diet Orders (From admission, onward)     Start     Ordered   04/07/21 1131  Diet regular Room service appropriate? Yes; Fluid consistency: Thin  Diet effective now       Question Answer Comment  Room service appropriate? Yes   Fluid consistency: Thin      04/07/21 1130              Code Status: Full Code   Subjective 04/07/21    Patient says she's feeling very well.  Doesn't like the food very much here.  Only eats sandwiches with mayo which she could not get on heart restricted diet.  We liberalized her  diet so she has more food choices and able to eat better while here, given she'll be here for awhile for procedure next week.  Patient and daughter appreciative.    Disposition Plan & Communication   Status is: Inpatient  Remains inpatient appropriate because:Ongoing diagnostic testing needed  not appropriate for outpatient work up.  Carotid procedure planned 9/14.  Patient requires 24-hour supervision assistance and lives alone.  Dispo: The patient is from: Home              Anticipated d/c is to: Home versus SNF depending on 24-hour help available              Patient currently is not medically stable to d/c.   Difficult to place patient No   Family Communication: [place] from [place] at bedside on rounds today 9/9.   Consults, Procedures, Significant Events   Consultants:  Neurology Vascular surgery  Procedures:  None  Antimicrobials:  Anti-infectives (From admission, onward)    None         Micro    Objective   Vitals:   04/06/21 2332 04/07/21 0402 04/07/21 0800 04/07/21 1257  BP: 111/64 124/82 123/67 98/60  Pulse: 70 68 66 67  Resp: 17 16 18 18   Temp: 98.4 F (36.9 C) 98 F (36.7 C) 98 F (36.7 C) 98.1 F (36.7 C)  TempSrc: Oral Oral Oral Oral  SpO2: 96% 99% 97% 97%  Weight:      Height:        Intake/Output Summary (Last 24 hours) at 04/07/2021 1458 Last data filed at 04/07/2021 [id number] Gross per 24 hour  Intake 240 ml  Output 600 ml  Net -360 ml   Filed Weights   04/04/21 1945 04/04/21 2355  Weight: 74.8 kg 74.9 kg    Physical Exam:  General exam: awake, alert, no acute distress Respiratory system: CTAB, no wheezes, rales or rhonchi, normal respiratory effort. Cardiovascular system: normal S1/S2, RRR, no pedal edema.   Gastrointestinal system: soft non-tender abdomen. Central nervous system: A&O x3.  No gross focal neurologic deficits, normal speech  Labs   Data Reviewed: I have personally reviewed following labs and imaging studies  CBC: Recent Labs  Lab 04/04/21 1953 04/05/21 0011 04/06/21 0104  WBC 10.8* 9.1 9.3  NEUTROABS 7.1 6.0  --   HGB 11.9* 10.7* 10.5*  HCT 37.6 35.0* 34.5*  MCV 83.2 82.9 82.7  PLT 274 231 231   Basic Metabolic Panel: Recent Labs  Lab 04/04/21 1953 04/05/21 0417 04/06/21 0104  NA  136  --  135  K 4.0  --  4.2  CL 101  --  104  CO2 27  --  23  GLUCOSE 168*  --  226*  BUN 26*  --  22  CREATININE 1.09* 0.98 1.00  CALCIUM 9.4  --  9.0   GFR: Estimated Creatinine Clearance: 47 mL/min (by C-G formula based on SCr of 1 mg/dL). Liver Function Tests: Recent Labs  Lab 04/04/21 1953  AST 17  ALT 10  ALKPHOS 96  BILITOT 0.7  PROT 6.8  ALBUMIN 3.6   No results for input(s): LIPASE, AMYLASE in the last 168 hours. No results for input(s): AMMONIA in the last 168 hours. Coagulation Profile: Recent Labs  Lab 04/04/21 1953  INR 1.1   Cardiac Enzymes: No results for input(s): CKTOTAL, CKMB, CKMBINDEX, TROPONINI in the last 168 hours. BNP (last 3 results) No results for input(s): PROBNP in the last 8760 hours. HbA1C: Recent Labs  04/05/21 0417  HGBA1C 7.7*   CBG: Recent Labs  Lab 04/06/21 0611 04/06/21 1100 04/06/21 1610 04/06/21 2136 04/07/21 0612  GLUCAP 169* 249* 209* 134* 133*   Lipid Profile: Recent Labs    04/05/21 0417  CHOL 137  HDL 34*  LDLCALC 79  TRIG 027  CHOLHDL 4.0   Thyroid Function Tests: No results for input(s): TSH, T4TOTAL, FREET4, T3FREE, THYROIDAB in the last 72 hours. Anemia Panel: No results for input(s): VITAMINB12, FOLATE, FERRITIN, TIBC, IRON, RETICCTPCT in the last 72 hours. Sepsis Labs: No results for input(s): PROCALCITON, LATICACIDVEN in the last 168 hours.  Recent Results (from the past 240 hour(s))  Resp Panel by RT-PCR (Flu A&B, Covid) Nasopharyngeal Swab     Status: None   Collection Time: 04/04/21  7:53 PM   Specimen: Nasopharyngeal Swab; Nasopharyngeal(NP) swabs in vial transport medium  Result Value Ref Range Status   SARS Coronavirus 2 by RT PCR NEGATIVE NEGATIVE Final    Comment: (NOTE) SARS-CoV-2 target nucleic acids are NOT DETECTED.  The SARS-CoV-2 RNA is generally detectable in upper respiratory specimens during the acute phase of infection. The lowest concentration of SARS-CoV-2 viral  copies this assay can detect is 138 copies/mL. A negative result does not preclude SARS-Cov-2 infection and should not be used as the sole basis for treatment or other patient management decisions. A negative result may occur with  improper specimen collection/handling, submission of specimen other than nasopharyngeal swab, presence of viral mutation(s) within the areas targeted by this assay, and inadequate number of viral copies(<138 copies/mL). A negative result must be combined with clinical observations, patient history, and epidemiological information. The expected result is Negative.  Fact Sheet for Patients:  BloggerCourse.com  Fact Sheet for Healthcare Providers:  SeriousBroker.it  This test is no t yet approved or cleared by the [place] FDA and  has been authorized for detection and/or diagnosis of SARS-CoV-2 by FDA under an Emergency Use Authorization (EUA). This EUA will remain  in effect (meaning this test can be used) for the duration of the COVID-19 declaration under Section 564(b)(1) of the Act, 21 U.S.C.section 360bbb-3(b)(1), unless the authorization is terminated  or revoked sooner.       Influenza A by PCR NEGATIVE NEGATIVE Final   Influenza B by PCR NEGATIVE NEGATIVE Final    Comment: (NOTE) The Xpert Xpress SARS-CoV-2/FLU/RSV plus assay is intended as an aid in the diagnosis of influenza from Nasopharyngeal swab specimens and should not be used as a sole basis for treatment. Nasal washings and aspirates are unacceptable for Xpert Xpress SARS-CoV-2/FLU/RSV testing.  Fact Sheet for Patients: BloggerCourse.com  Fact Sheet for Healthcare Providers: SeriousBroker.it  This test is not yet approved or cleared by the [place] FDA and has been authorized for detection and/or diagnosis of SARS-CoV-2 by FDA under an Emergency Use Authorization (EUA). This  EUA will remain in effect (meaning this test can be used) for the duration of the COVID-19 declaration under Section 564(b)(1) of the Act, 21 U.S.C. section 360bbb-3(b)(1), unless the authorization is terminated or revoked.  Performed at [facility], [address] [place], [place] [address]   Culture, blood (single)     Status: None (Preliminary result)   Collection Time: 04/06/21  1:04 AM   Specimen: BLOOD RIGHT HAND  Result Value Ref Range Status   Specimen Description BLOOD RIGHT HAND  Final   Special Requests   Final    BOTTLES DRAWN AEROBIC AND ANAEROBIC Blood Culture adequate volume  Culture   Final    NO GROWTH 1 DAY Performed at [facility], [address] [place], [place] [address]    Report Status PENDING  Incomplete      Imaging Studies   VAS US CAROTID  Result Date: 04/06/2021 Carotid Arterial Duplex Study Patient Name:  Erin Baker  Date of Exam:   04/06/2021 Medical Rec #: [id number]     Accession #:    [id number] Date of Birth: 1941/03/31     Patient Gender: F Patient Age:   27 years Exam Location:  [facility] Procedure:      VAS US CAROTID Referring Phys: Sherald Hess --------------------------------------------------------------------------------  Indications:       CVA, Speech disturbance and facial droop. CTA indicates 50%                    right ICA stenosis and 40% left ICA stenosis. Risk Factors:      Hypertension, Diabetes, past history of smoking, coronary                    artery disease, prior CVA. 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 Loop recorder. Comparison Study:  No prior study Performing Technologist: Sherren Kerns RVS  Examination Guidelines: A complete evaluation includes B-mode imaging, spectral Doppler, color Doppler, and power Doppler as needed of all accessible portions of each vessel. Bilateral testing is considered an integral part of a  complete examination. Limited examinations for reoccurring indications may be performed as noted.  Right Carotid Findings: +----------+--------+--------+--------+------------------+------------------+           PSV cm/sEDV cm/sStenosisPlaque DescriptionComments           +----------+--------+--------+--------+------------------+------------------+ CCA Prox  47      11                                intimal thickening +----------+--------+--------+--------+------------------+------------------+ CCA Distal42      14                                intimal thickening +----------+--------+--------+--------+------------------+------------------+ ICA Prox  201     69      60-79%  calcific          Shadowing          +----------+--------+--------+--------+------------------+------------------+ ICA Mid   123     38                                                   +----------+--------+--------+--------+------------------+------------------+ ICA Distal55      20                                                   +----------+--------+--------+--------+------------------+------------------+ ECA       117     12                                                   +----------+--------+--------+--------+------------------+------------------+ +----------+--------+-------+--------+-------------------+  PSV cm/sEDV cmsDescribeArm Pressure (mmHG) +----------+--------+-------+--------+-------------------+ [id number]                                        +----------+--------+-------+--------+-------------------+ +---------+--------+--+--------+--+ VertebralPSV cm/s32EDV cm/s11 +---------+--------+--+--------+--+  Left Carotid Findings: +----------+--------+--------+--------+---------------------+------------------+           PSV cm/sEDV cm/sStenosisPlaque Description   Comments            +----------+--------+--------+--------+---------------------+------------------+ CCA Prox  100     10                                   intimal thickening +----------+--------+--------+--------+---------------------+------------------+ CCA Distal91      11                                   intimal thickening +----------+--------+--------+--------+---------------------+------------------+ ICA Prox  119     34      1-39%   calcific and         Shadowing                                            irregular                               +----------+--------+--------+--------+---------------------+------------------+ ICA Distal86      34                                                      +----------+--------+--------+--------+---------------------+------------------+ ECA       104     11                                                      +----------+--------+--------+--------+---------------------+------------------+ +----------+--------+--------+--------+-------------------+           PSV cm/sEDV cm/sDescribeArm Pressure (mmHG) +----------+--------+--------+--------+-------------------+ Subclavian171                                         +----------+--------+--------+--------+-------------------+ +---------+--------+--+--------+--+ VertebralPSV cm/s58EDV cm/s12 +---------+--------+--+--------+--+   Summary: Right Carotid: 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 Left Carotid: Velocities in the left ICA are consistent with a 1-39% stenosis. Vertebrals:  Bilateral vertebral arteries demonstrate antegrade flow. Subclavians: Normal flow hemodynamics were seen in bilateral subclavian              arteries. *See table(s) above for measurements and observations.     Preliminary      Medications   Scheduled Meds:  aspirin EC  81 mg Oral Daily   atorvastatin  80 mg Oral Daily   clopidogrel  75 mg Oral Daily    enoxaparin (LOVENOX) injection  40 mg Subcutaneous Q24H   insulin aspart  0-9 Units Subcutaneous TID WC   insulin detemir  30 Units Subcutaneous QHS   metoprolol tartrate  25 mg Oral BID   sertraline  50 mg Oral Daily   Continuous Infusions:     LOS: 3 days    Time spent: 30 minutes with > 50% spent at bedside and in coordination of care     Pennie Banter, DO [facility]  04/07/2021, 2:58 PM      If 7PM-7AM, please contact night-coverage. How to contact the [facility] Attending or Consulting provider 7A - 7P or covering provider during after hours 7P -7A, for this patient?    Check the care team in [facility] and look for a) attending/consulting TR[facility] provider listed and b) the [facility] team listed Log into [email or web address] and use [place] universal password to access. If you do not have the password, please contact the hospital operator. Locate the [facility] provider you are looking for under [facility] and page to a number that you can be directly reached. If you still have difficulty reaching the provider, please page the [facility] (Director on Call) for the Hospitalists listed on amion for assistance.

## 2021-04-07 NOTE — TOC Progression Note (Signed)
Transition of Care (TOC) - Progression Note  Sander Radon, BSN Transitions of Care Unit 4E- RN Case Manager See Treatment Team for direct phone #    Patient Details  Name: Erin Baker MRN: [id number] Date of Birth: 1940/11/21  Transition of Care [facility]) CM/SW Contact  Zenda Alpers Lenn Sink, RN Phone Number: 04/07/2021, 12:04 PM  Clinical Narrative:    Follow up done with pt and daughter Erin Baker at the bedside, Erin Baker has found that pt has a supplemental plan with [employer] that may cover ST home aides in the home post discharge- Erin Baker to call plan to find out more about coverage to see if it might be an option.  Erin Baker will plan to be here next week for mom's procedure and will be here a few days to make sure about final plan for transition post procedure- will have PT/OT f/u as well post procedure to see where pt is with activity and safe transition recommendations.    Expected Discharge Plan: IP Rehab Facility Barriers to Discharge: Unsafe home situation  Expected Discharge Plan and Services Expected Discharge Plan: IP Rehab Facility In-house Referral: Clinical Social Work Discharge Planning Services: CM Consult Post Acute Care Choice: IP Rehab, Skilled Nursing Facility Living arrangements for the past 2 months: Single Family Home                                       Social Determinants of Health (SDOH) Interventions    Readmission Risk Interventions No flowsheet data found.

## 2021-04-08 DIAGNOSIS — R471 Dysarthria and anarthria: Secondary | ICD-10-CM | POA: Diagnosis not present

## 2021-04-08 LAB — GLUCOSE, CAPILLARY
Glucose-Capillary: 134 mg/dL — ABNORMAL HIGH (ref 70–99)
Glucose-Capillary: 159 mg/dL — ABNORMAL HIGH (ref 70–99)
Glucose-Capillary: 252 mg/dL — ABNORMAL HIGH (ref 70–99)

## 2021-04-08 MED ORDER — FUROSEMIDE 20 MG PO TABS
20.0000 mg | ORAL_TABLET | Freq: Once | ORAL | Status: AC
  Administered 2021-04-08: 20 mg via ORAL
  Filled 2021-04-08: qty 1

## 2021-04-08 NOTE — Progress Notes (Signed)
Physical Therapy Treatment Patient Details Name: Erin Baker MRN: [id number] DOB: 22-Aug-1940 Today's Date: 04/08/2021    History of Present Illness 80 y.o. female presenting to ED 9/6 with dysarthria. CT head (-) for acute findings. CT angio (+) plaque around carotic bifurcation. Did not pass stroke swallow test. MRI 9/7 with results pending. PMHx significant for CAD s/p stenting, Hx of CVA x5, thoracic endovascular aneurysm s/p repair,  subclavian artery repair, HTN, DMII and Hx of tobacco use.    PT Comments    Pt making functional gains but continues to require assistance for safety.  Continue to recommend assistance at d/c.     Follow Up Recommendations  Supervision/Assistance - 24 hour     Equipment Recommendations  3in1 (PT) (rollator)    Recommendations for Other Services       Precautions / Restrictions Precautions Precautions: Fall Precaution Comments: Decreased awareness/insight; emotionally labile Restrictions Weight Bearing Restrictions: No    Mobility  Bed Mobility Overal bed mobility: Modified Independent Bed Mobility: Supine to Sit Rolling: Modified independent (Device/Increase time)         General bed mobility comments: Increased time and effort but no assistance needed.    Transfers Overall transfer level: Needs assistance Equipment used: Rolling [place] (2 wheeled) Transfers: Sit to/from Stand Sit to Stand: Supervision         General transfer comment: Cues for hand placement.  Ambulation/Gait Ambulation/Gait assistance: Min guard Gait Distance (Feet): 120 Feet Assistive device: Rolling [place] (2 wheeled) Gait Pattern/deviations: Step-through pattern;Decreased stride length;Antalgic;Drifts right/left Gait velocity: decr   General Gait Details: Pt continues to present with drift but no overt LOB.  Pt does fatigue rather quickly and requires cues for pacing.  Performed decreased gt distance this session   Stairs Stairs: Yes Stairs  assistance: Min guard Stair Management: One rail Left;Forwards;Sideways Number of Stairs: 5 General stair comments: Cues for sequencing and safety.  heavy reliance of rail for balance. Performed sideways to descend.   Wheelchair Mobility    Modified Rankin (Stroke Patients Only) Modified Rankin (Stroke Patients Only) Pre-Morbid Rankin Score: No significant disability Modified Rankin: Moderately severe disability     Balance Overall balance assessment: Needs assistance Sitting-balance support: Feet supported Sitting balance-[name] Scale: Good       Standing balance-[name] Scale: Fair                              Cognition Arousal/Alertness: Awake/alert Behavior During Therapy: WFL for tasks assessed/performed Overall Cognitive Status: Within Functional Limits for tasks assessed                                        Exercises      General Comments        Pertinent Vitals/Pain Pain Assessment: No/denies pain    Home Living                      Prior Function            PT Goals (current goals can now be found in the care plan section) Acute Rehab PT Goals Patient Stated Goal: To return home independently Potential to Achieve Goals: Good Progress towards PT goals: Progressing toward goals    Frequency    Min 4X/week      PT Plan Current plan remains appropriate    Co-evaluation  AM-PAC PT "6 Clicks" Mobility   Outcome Measure  Help needed turning from your back to your side while in a flat bed without using bedrails?: None Help needed moving from lying on your back to sitting on the side of a flat bed without using bedrails?: None Help needed moving to and from a bed to a chair (including a wheelchair)?: A Little Help needed standing up from a chair using your arms (e.g., wheelchair or bedside chair)?: A Little Help needed to walk in hospital room?: A Little Help needed climbing 3-5 steps with  a railing? : A Lot 6 Click Score: 19    End of Session Equipment Utilized During Treatment: Gait belt Activity Tolerance: Patient limited by fatigue Patient left: in chair;with call bell/phone within reach;with chair alarm set Nurse Communication: Mobility status PT Visit Diagnosis: Other abnormalities of gait and mobility (R26.89);Difficulty in walking, not elsewhere classified (R26.2)     Time: 7341-9379 PT Time Calculation (min) (ACUTE ONLY): 13 min  Charges:  $Gait Training: 8-22 mins                     Bonney Leitz , PTA Acute Rehabilitation Services Pager [phone] Office [phone]    Alexanderjames Berg Artis Delay 04/08/2021, 11:45 AM

## 2021-04-08 NOTE — Progress Notes (Signed)
PROGRESS NOTE    Erin Baker   CBU:[id number]  DOB: 15-Aug-1940  PCP: Pcp, No    DOA: 04/04/2021 LOS: 4    Brief Narrative / Hospital Course to Date:   80 year old female with past medical history of CAD status post stenting, thoracic endovascular aneurysm repair, subclavian artery repair, hypertension, diabetes who presented with dysarthria that was noted on the phone while she was talking with a family member.    Noncontrast CT head was unremarkable.  Telemetry neurologist was consulted and once metabolic reasons for dysarthria were ruled out, brain MRI was obtained which showed a small right frontal infarct.  CTA of head and neck did show some carotid stenosis which was confirmed on carotid duplex ultrasound.  Vascular surgery was consulted and plan for intervention on Wednesday 9/14.  Patient has been evaluated by PT and OT, both recommending strict 24-hour supervision and assistance after discharge.  Patient lives alone and daughter out of state.  They are exploring caregiver options.  Assessment & Plan   Principal Problem:   Dysarthria Active Problems:   CAD (coronary artery disease)   Hypertension   Normocytic anemia   Type 2 diabetes mellitus with vascular disease (HCC)   Cerebral embolism with cerebral infarction   Acute ischemic right frontal lobe stroke presenting with dysarthria -deficits and resolved.  Brain MRI also showed numerous chronic supra and infratentorial infarcts of unclear etiology.  Patient had a loop recorder placed in 2019 which reportedly has not showed any A. fib. 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 --Management per stroke team --Telemetry --DAPT with aspirin and Plavix --Loop recorder interrogated and noted battery fully depleted --PT and OT recommend strict 24-hour supervision and assistance after discharge but  no rehab needs.  Supervision needed due to poor executive function, see their notes.  Patient appears to have poor short-term memory and would have difficulty managing her medications etc. unclear if any underlying history of cognitive impairment.  Right ICA stenosis -Vascular surgery consulted, plan for TCAR next Wednesday  History of prior strokes -with multiple chronic infarcts in multiple territories including cerebellar.  Continue work-up for A. Fib.  Otherwise management as above per neuro.  Lower extremity edema -takes 20 mg oral Lasix as needed at home. --Single dose Lasix 20 mg p.o. today --Monitor for swelling and give further Lasix if needed  Hypertension -BP stable.  Per neuro, BP goal 130-160 before carotid intervention.  Hyperlipidemia -continue Lipitor 80 mg.  Goal LDL less than 70.  Uncontrolled type 2 diabetes -A1c 7.7%.  Continue sliding scale NovoLog.  ?  Cognitive impairment with poor short-term memory versus acute encephalopathy related to stroke -see OT notes regarding patient's current executive functions. --Requires 24-hour supervision and assistance after discharge from the hospital.  Patient BMI: Body mass index is 27.48 kg/m.   DVT prophylaxis: enoxaparin (LOVENOX) injection 40 mg Start: 04/05/21 1000   Diet:  Diet Orders (From admission, onward)     Start     Ordered   04/07/21 1131  Diet regular Room service appropriate? Yes; Fluid consistency: Thin  Diet effective now       Question Answer Comment  Room service appropriate? Yes   Fluid consistency: Thin      04/07/21 1130              Code Status: Full Code   Subjective 04/08/21    Patient awake sitting up  in bed when seen this morning.  She is asking for some ice chips for her tea.  She says she is feeling well.  May have some swelling in her ankles but states that she takes her Lasix as needed for this at home.  No other acute complaints.   Disposition Plan & Communication   Status is:  Inpatient  Remains inpatient appropriate because:Ongoing diagnostic testing needed not appropriate for outpatient work up.  Carotid procedure planned 9/14.  Patient requires 24-hour supervision assistance and lives alone.  Dispo: The patient is from: Home              Anticipated d/c is to: Home versus SNF depending on 24-hour help available              Patient currently is not medically stable to d/c.   Difficult to place patient No   Family Communication: daughter visiting from [place] at bedside on rounds 9/9.   Consults, Procedures, Significant Events   Consultants:  Neurology Vascular surgery  Procedures:  None  Antimicrobials:  Anti-infectives (From admission, onward)    None         Micro    Objective   Vitals:   04/07/21 2053 04/07/21 2327 04/08/21 0407 04/08/21 0830  BP:  (!) 123/55 (!) 123/56 129/69  Pulse: 70 66 62 62  Resp:  19 19 18   Temp:  (!) 97.5 F (36.4 C) 97.8 F (36.6 C) 97.6 F (36.4 C)  TempSrc:  Oral Oral Oral  SpO2:  95% 96% 96%  Weight:      Height:       No intake or output data in the 24 hours ending 04/08/21 1540  Filed Weights   04/04/21 1945 04/04/21 2355  Weight: 74.8 kg 74.9 kg    Physical Exam:  General exam: awake, alert, no acute distress Respiratory system: CTAB, normal respiratory effort, on room air Cardiovascular system: normal S1/S2, RRR, trace pedal edema.   Gastrointestinal system: soft non-tender abdomen. Central nervous system: A&O x3.  No gross focal neurologic deficits, normal speech Psychiatric -normal mood congruent affect, judgment and insight appear normal  Labs   Data Reviewed: I have personally reviewed following labs and imaging studies  CBC: Recent Labs  Lab 04/04/21 1953 04/05/21 0011 04/06/21 0104  WBC 10.8* 9.1 9.3  NEUTROABS 7.1 6.0  --   HGB 11.9* 10.7* 10.5*  HCT 37.6 35.0* 34.5*  MCV 83.2 82.9 82.7  PLT 274 231 231   Basic Metabolic Panel: Recent Labs  Lab 04/04/21 1953  04/05/21 0417 04/06/21 0104  NA 136  --  135  K 4.0  --  4.2  CL 101  --  104  CO2 27  --  23  GLUCOSE 168*  --  226*  BUN 26*  --  22  CREATININE 1.09* 0.98 1.00  CALCIUM 9.4  --  9.0   GFR: Estimated Creatinine Clearance: 47 mL/min (by C-G formula based on SCr of 1 mg/dL). Liver Function Tests: Recent Labs  Lab 04/04/21 1953  AST 17  ALT 10  ALKPHOS 96  BILITOT 0.7  PROT 6.8  ALBUMIN 3.6   No results for input(s): LIPASE, AMYLASE in the last 168 hours. No results for input(s): AMMONIA in the last 168 hours. Coagulation Profile: Recent Labs  Lab 04/04/21 1953  INR 1.1   Cardiac Enzymes: No results for input(s): CKTOTAL, CKMB, CKMBINDEX, TROPONINI in the last 168 hours. BNP (last 3 results) No results for input(s): PROBNP in  the last 8760 hours. HbA1C: No results for input(s): HGBA1C in the last 72 hours.  CBG: Recent Labs  Lab 04/06/21 2136 04/07/21 0612 04/07/21 1758 04/07/21 2040 04/08/21 0611  GLUCAP 134* 133* 188* 259* 134*   Lipid Profile: No results for input(s): CHOL, HDL, LDLCALC, TRIG, CHOLHDL, LDLDIRECT in the last 72 hours.  Thyroid Function Tests: No results for input(s): TSH, T4TOTAL, FREET4, T3FREE, THYROIDAB in the last 72 hours. Anemia Panel: No results for input(s): VITAMINB12, FOLATE, FERRITIN, TIBC, IRON, RETICCTPCT in the last 72 hours. Sepsis Labs: No results for input(s): PROCALCITON, LATICACIDVEN in the last 168 hours.  Recent Results (from the past 240 hour(s))  Resp Panel by RT-PCR (Flu A&B, Covid) Nasopharyngeal Swab     Status: None   Collection Time: 04/04/21  7:53 PM   Specimen: Nasopharyngeal Swab; Nasopharyngeal(NP) swabs in vial transport medium  Result Value Ref Range Status   SARS Coronavirus 2 by RT PCR NEGATIVE NEGATIVE Final    Comment: (NOTE) SARS-CoV-2 target nucleic acids are NOT DETECTED.  The SARS-CoV-2 RNA is generally detectable in upper respiratory specimens during the acute phase of infection. The  lowest concentration of SARS-CoV-2 viral copies this assay can detect is 138 copies/mL. A negative result does not preclude SARS-Cov-2 infection and should not be used as the sole basis for treatment or other patient management decisions. A negative result may occur with  improper specimen collection/handling, submission of specimen other than nasopharyngeal swab, presence of viral mutation(s) within the areas targeted by this assay, and inadequate number of viral copies(<138 copies/mL). A negative result must be combined with clinical observations, patient history, and epidemiological information. The expected result is Negative.  Fact Sheet for Patients:  BloggerCourse.com  Fact Sheet for Healthcare Providers:  SeriousBroker.it  This test is no t yet approved or cleared by the [place] FDA and  has been authorized for detection and/or diagnosis of SARS-CoV-2 by FDA under an Emergency Use Authorization (EUA). This EUA will remain  in effect (meaning this test can be used) for the duration of the COVID-19 declaration under Section 564(b)(1) of the Act, 21 U.S.C.section 360bbb-3(b)(1), unless the authorization is terminated  or revoked sooner.       Influenza A by PCR NEGATIVE NEGATIVE Final   Influenza B by PCR NEGATIVE NEGATIVE Final    Comment: (NOTE) The Xpert Xpress SARS-CoV-2/FLU/RSV plus assay is intended as an aid in the diagnosis of influenza from Nasopharyngeal swab specimens and should not be used as a sole basis for treatment. Nasal washings and aspirates are unacceptable for Xpert Xpress SARS-CoV-2/FLU/RSV testing.  Fact Sheet for Patients: BloggerCourse.com  Fact Sheet for Healthcare Providers: SeriousBroker.it  This test is not yet approved or cleared by the [place] FDA and has been authorized for detection and/or diagnosis of SARS-CoV-2 by FDA under  an Emergency Use Authorization (EUA). This EUA will remain in effect (meaning this test can be used) for the duration of the COVID-19 declaration under Section 564(b)(1) of the Act, 21 U.S.C. section 360bbb-3(b)(1), unless the authorization is terminated or revoked.  Performed at [facility], [address] [place], [place] [address]   Culture, blood (single)     Status: None (Preliminary result)   Collection Time: 04/06/21  1:04 AM   Specimen: BLOOD RIGHT HAND  Result Value Ref Range Status   Specimen Description BLOOD RIGHT HAND  Final   Special Requests   Final    BOTTLES DRAWN AEROBIC AND ANAEROBIC Blood Culture adequate  volume   Culture   Final    NO GROWTH 2 DAYS Performed at [facility], [address] [place], [place] [address]    Report Status PENDING  Incomplete      Imaging Studies   No results found.   Medications   Scheduled Meds:  aspirin EC  81 mg Oral Daily   atorvastatin  80 mg Oral Daily   clopidogrel  75 mg Oral Daily   enoxaparin (LOVENOX) injection  40 mg Subcutaneous Q24H   insulin aspart  0-9 Units Subcutaneous TID WC   insulin detemir  30 Units Subcutaneous QHS   metoprolol tartrate  25 mg Oral BID   sertraline  50 mg Oral Daily   Continuous Infusions:     LOS: 4 days    Time spent: 25 minutes with > 50% spent at bedside and in coordination of care     Pennie BanterKelly A Noach Calvillo, DO [facility]  04/08/2021, 3:40 PM      If 7PM-7AM, please contact night-coverage. How to contact the [facility] Attending or Consulting provider 7A - 7P or covering provider during after hours 7P -7A, for this patient?    Check the care team in [facility] and look for a) attending/consulting [facility] provider listed and b) the [facility] team listed Log into [email or web address] and use [place]'s universal password to access. If you do not have the password, please contact the hospital operator. Locate the [facility] provider you are looking for under [facility] and page to a number that you can be directly reached. If you still have difficulty reaching the provider, please page the [facility] (Director on Call) for the Hospitalists listed on amion for assistance.

## 2021-04-09 DIAGNOSIS — R471 Dysarthria and anarthria: Secondary | ICD-10-CM | POA: Diagnosis not present

## 2021-04-09 LAB — GLUCOSE, CAPILLARY
Glucose-Capillary: 147 mg/dL — ABNORMAL HIGH (ref 70–99)
Glucose-Capillary: 192 mg/dL — ABNORMAL HIGH (ref 70–99)
Glucose-Capillary: 218 mg/dL — ABNORMAL HIGH (ref 70–99)
Glucose-Capillary: 178 mg/dL — ABNORMAL HIGH (ref 70–99)

## 2021-04-09 NOTE — Plan of Care (Signed)
Progressing, will continue to monitor.  

## 2021-04-09 NOTE — Progress Notes (Signed)
Physical Therapy Treatment Patient Details Name: Erin Baker MRN: [id number] DOB: Jul 05, 1941 Today's Date: 04/09/2021    History of Present Illness 80 y.o. female presenting to ED 9/6 with dysarthria. CT angio (+) plaque around carotid bifurcation. MRI with Rt frontal infarct. PMHx: CAD s/p stenting, Hx of CVA x5, thoracic endovascular aneurysm s/p repair,  subclavian artery repair, HTN, DMII and Hx of tobacco use.    PT Comments    Pt pleasant and demonstrating increased gait distance. PPt able to navigate to room with difficulty recalling room number. Pt with maintained fatigue and bil hip pain with activity with education for HEP. Pt continues to report no caregiver support and is awaiting Sx 9/14. Will continue to follow.  VSS    Follow Up Recommendations  Supervision/Assistance - 24 hour     Equipment Recommendations  3in1 (PT)    Recommendations for Other Services       Precautions / Restrictions Precautions Precautions: Fall    Mobility  Bed Mobility Overal bed mobility: Modified Independent Bed Mobility: Supine to Sit           General bed mobility comments: HOB 20 degrees without assist    Transfers Overall transfer level: Modified independent               General transfer comment: pt able to stand and sit with good control without assist  Ambulation/Gait Ambulation/Gait assistance: Min guard Gait Distance (Feet): 350 Feet Assistive device: None Gait Pattern/deviations: Step-through pattern;Decreased stride length   Gait velocity interpretation: 1.31 - 2.62 ft/sec, indicative of limited community ambulator General Gait Details: pt with slightly unsteady gait with limited veering and guarding for safety. pt with increased gait tolerance and reports fatigue with gait needing  2 standing rest breaks with pt able to navigate return to room   Stairs             Wheelchair Mobility    Modified Rankin (Stroke Patients Only) Modified Rankin  (Stroke Patients Only) Pre-Morbid Rankin Score: No significant disability Modified Rankin: Moderately severe disability     Balance Overall balance assessment: Needs assistance   Sitting balance-[name] Scale: Good Sitting balance - Comments: EOb without assist   Standing balance support: No upper extremity supported Standing balance-[name] Scale: Fair Standing balance comment: pt able to walk without UE support with deficits noted                            Cognition Arousal/Alertness: Awake/alert Behavior During Therapy: WFL for tasks assessed/performed Overall Cognitive Status: Within Functional Limits for tasks assessed                                        Exercises General Exercises - Lower Extremity Long Arc Quad: AROM;Both;Seated;20 reps Hip Flexion/Marching: AROM;Both;Seated;20 reps    General Comments        Pertinent Vitals/Pain Pain Score: 2  Pain Location: bil hips with fatigue Pain Descriptors / Indicators: Discomfort;Sore Pain Intervention(s): Limited activity within patient's tolerance;Repositioned    Home Living                      Prior Function            PT Goals (current goals can now be found in the care plan section) Progress towards PT goals: Progressing toward goals    Frequency  Min 3X/week      PT Plan Current plan remains appropriate    Co-evaluation              AM-PAC PT "6 Clicks" Mobility   Outcome Measure  Help needed turning from your back to your side while in a flat bed without using bedrails?: None Help needed moving from lying on your back to sitting on the side of a flat bed without using bedrails?: None Help needed moving to and from a bed to a chair (including a wheelchair)?: None Help needed standing up from a chair using your arms (e.g., wheelchair or bedside chair)?: A Little Help needed to walk in hospital room?: A Little Help needed climbing 3-5 steps with a  railing? : A Little 6 Click Score: 21    End of Session Equipment Utilized During Treatment: Gait belt Activity Tolerance: Patient tolerated treatment well Patient left: in chair;with call bell/phone within reach;with chair alarm set Nurse Communication: Mobility status PT Visit Diagnosis: Other abnormalities of gait and mobility (R26.89);Difficulty in walking, not elsewhere classified (R26.2)     Time: 7341-9379 PT Time Calculation (min) (ACUTE ONLY): 19 min  Charges:  $Gait Training: 8-22 mins                     Merryl Hacker, PT Acute Rehabilitation Services Pager: [phone] Office: [phone]    Casten Floren B Ardian Haberland 04/09/2021, 9:21 AM

## 2021-04-09 NOTE — Progress Notes (Signed)
PROGRESS NOTE    Erin Baker   JWJ:[id number]  DOB: 05-08-1941  PCP: Pcp, No    DOA: 04/04/2021 LOS: 5    Brief Narrative / Hospital Course to Date:   80 year old female with past medical history of CAD status post stenting, thoracic endovascular aneurysm repair, subclavian artery repair, hypertension, diabetes who presented with dysarthria that was noted on the phone while she was talking with a family member.    Noncontrast CT head was unremarkable.  Telemetry neurologist was consulted and once metabolic reasons for dysarthria were ruled out, brain MRI was obtained which showed a small right frontal infarct.  CTA of head and neck did show some carotid stenosis which was confirmed on carotid duplex ultrasound.  Vascular surgery was consulted and plan for intervention on Wednesday 9/14.  Patient has been evaluated by PT and OT, both recommending strict 24-hour supervision and assistance after discharge.  Patient lives alone and daughter out of state.  They are exploring caregiver options.  Assessment & Plan   Principal Problem:   Dysarthria Active Problems:   CAD (coronary artery disease)   Hypertension   Normocytic anemia   Type 2 diabetes mellitus with vascular disease (HCC)   Cerebral embolism with cerebral infarction   Acute ischemic right frontal lobe stroke presenting with dysarthria -deficits and resolved.  Brain MRI also showed numerous chronic supra and infratentorial infarcts of unclear etiology.  Patient had a loop recorder placed in 2019 which reportedly has not showed any A. fib. 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 --Management per stroke team --Telemetry --DAPT with aspirin and Plavix --Loop recorder interrogated and noted battery fully depleted --PT and OT recommend strict 24-hour supervision and assistance after discharge but  no rehab needs.  Supervision needed due to poor executive function, see their notes.  Patient appears to have poor short-term memory and would have difficulty managing her medications etc. unclear if any underlying history of cognitive impairment.  Right ICA stenosis -Vascular surgery consulted, plan for TCAR next Wednesday  History of prior strokes -with multiple chronic infarcts in multiple territories including cerebellar.  Continue work-up for A. Fib.  Otherwise management as above per neuro.  Lower extremity edema -takes 20 mg oral Lasix as needed at home. --Single dose Lasix 20 mg p.o. today --Monitor for swelling and give further Lasix if needed  Hypertension -BP stable.  Per neuro, BP goal 130-160 before carotid intervention.  Hyperlipidemia -continue Lipitor 80 mg.  Goal LDL less than 70.  Uncontrolled type 2 diabetes -A1c 7.7%.  Continue sliding scale NovoLog.  ?  Cognitive impairment with poor short-term memory versus acute encephalopathy related to stroke -see OT notes regarding patient's current executive functions. --Requires 24-hour supervision and assistance after discharge from the hospital.  Patient BMI: Body mass index is 27.48 kg/m.   DVT prophylaxis: enoxaparin (LOVENOX) injection 40 mg Start: 04/05/21 1000   Diet:  Diet Orders (From admission, onward)     Start     Ordered   04/07/21 1131  Diet regular Room service appropriate? Yes; Fluid consistency: Thin  Diet effective now       Question Answer Comment  Room service appropriate? Yes   Fluid consistency: Thin      04/07/21 1130              Code Status: Full Code   Subjective 04/09/21    Patient up in recliner  when seen this morning.  She reports feeling well.  Does not care for the food here so says not eating very well but says drinking plenty of fluids.  No other acute complaints.   Disposition Plan & Communication   Status is: Inpatient  Remains inpatient appropriate because:Ongoing  diagnostic testing needed not appropriate for outpatient work up.  Carotid procedure planned 9/14.  Patient requires 24-hour supervision assistance and lives alone.  Dispo: The patient is from: Home              Anticipated d/c is to: Home versus SNF depending on 24-hour help available              Patient currently is not medically stable to d/c.   Difficult to place patient No   Family Communication: daughter visiting from [place] at bedside on rounds 9/9.   Consults, Procedures, Significant Events   Consultants:  Neurology Vascular surgery  Procedures:  None  Antimicrobials:  Anti-infectives (From admission, onward)    None         Micro    Objective   Vitals:   04/08/21 2333 04/09/21 0358 04/09/21 0850 04/09/21 1153  BP: (!) 123/56 (!) 118/58 114/73 133/74  Pulse: 63 60 64 61  Resp: 19 16 16 16   Temp: 98.2 F (36.8 C) 97.7 F (36.5 C) 97.8 F (36.6 C) 98.1 F (36.7 C)  TempSrc: Oral Oral Oral Oral  SpO2: 94% 95% 92% 99%  Weight:      Height:        Intake/Output Summary (Last 24 hours) at 04/09/2021 1424 Last data filed at 04/09/2021 0930 Gross per 24 hour  Intake 240 ml  Output --  Net 240 ml    Filed Weights   04/04/21 1945 04/04/21 2355  Weight: 74.8 kg 74.9 kg    Physical Exam:  General exam: awake, alert, no acute distress Respiratory system: normal respiratory effort, on room air Cardiovascular system: RRR, trace pedal edema.   Central nervous system: A&O x3.  No gross focal neurologic deficits, normal speech   Labs   Data Reviewed: I have personally reviewed following labs and imaging studies  CBC: Recent Labs  Lab 04/04/21 1953 04/05/21 0011 04/06/21 0104  WBC 10.8* 9.1 9.3  NEUTROABS 7.1 6.0  --   HGB 11.9* 10.7* 10.5*  HCT 37.6 35.0* 34.5*  MCV 83.2 82.9 82.7  PLT 274 231 231   Basic Metabolic Panel: Recent Labs  Lab 04/04/21 1953 04/05/21 0417 04/06/21 0104  NA 136  --  135  K 4.0  --  4.2  CL 101  --  104   CO2 27  --  23  GLUCOSE 168*  --  226*  BUN 26*  --  22  CREATININE 1.09* 0.98 1.00  CALCIUM 9.4  --  9.0   GFR: Estimated Creatinine Clearance: 47 mL/min (by C-G formula based on SCr of 1 mg/dL). Liver Function Tests: Recent Labs  Lab 04/04/21 1953  AST 17  ALT 10  ALKPHOS 96  BILITOT 0.7  PROT 6.8  ALBUMIN 3.6   No results for input(s): LIPASE, AMYLASE in the last 168 hours. No results for input(s): AMMONIA in the last 168 hours. Coagulation Profile: Recent Labs  Lab 04/04/21 1953  INR 1.1   Cardiac Enzymes: No results for input(s): CKTOTAL, CKMB, CKMBINDEX, TROPONINI in the last 168 hours. BNP (last 3 results) No results for input(s): PROBNP in the last 8760 hours. HbA1C: No results for input(s): HGBA1C in  the last 72 hours.  CBG: Recent Labs  Lab 04/08/21 0611 04/08/21 1554 04/08/21 2033 04/09/21 0540 04/09/21 1150  GLUCAP 134* 159* 252* 147* 192*   Lipid Profile: No results for input(s): CHOL, HDL, LDLCALC, TRIG, CHOLHDL, LDLDIRECT in the last 72 hours.  Thyroid Function Tests: No results for input(s): TSH, T4TOTAL, FREET4, T3FREE, THYROIDAB in the last 72 hours. Anemia Panel: No results for input(s): VITAMINB12, FOLATE, FERRITIN, TIBC, IRON, RETICCTPCT in the last 72 hours. Sepsis Labs: No results for input(s): PROCALCITON, LATICACIDVEN in the last 168 hours.  Recent Results (from the past 240 hour(s))  Resp Panel by RT-PCR (Flu A&B, Covid) Nasopharyngeal Swab     Status: None   Collection Time: 04/04/21  7:53 PM   Specimen: Nasopharyngeal Swab; Nasopharyngeal(NP) swabs in vial transport medium  Result Value Ref Range Status   SARS Coronavirus 2 by RT PCR NEGATIVE NEGATIVE Final    Comment: (NOTE) SARS-CoV-2 target nucleic acids are NOT DETECTED.  The SARS-CoV-2 RNA is generally detectable in upper respiratory specimens during the acute phase of infection. The lowest concentration of SARS-CoV-2 viral copies this assay can detect is 138  copies/mL. A negative result does not preclude SARS-Cov-2 infection and should not be used as the sole basis for treatment or other patient management decisions. A negative result may occur with  improper specimen collection/handling, submission of specimen other than nasopharyngeal swab, presence of viral mutation(s) within the areas targeted by this assay, and inadequate number of viral copies(<138 copies/mL). A negative result must be combined with clinical observations, patient history, and epidemiological information. The expected result is Negative.  Fact Sheet for Patients:  BloggerCourse.comhttps://www.fda.gov/media/152166/download  Fact Sheet for Healthcare Providers:  SeriousBroker.ithttps://www.fda.gov/media/152162/download  This test is no t yet approved or cleared by the [place] FDA and  has been authorized for detection and/or diagnosis of SARS-CoV-2 by FDA under an Emergency Use Authorization (EUA). This EUA will remain  in effect (meaning this test can be used) for the duration of the COVID-19 declaration under Section 564(b)(1) of the Act, 21 U.S.C.section 360bbb-3(b)(1), unless the authorization is terminated  or revoked sooner.       Influenza A by PCR NEGATIVE NEGATIVE Final   Influenza B by PCR NEGATIVE NEGATIVE Final    Comment: (NOTE) The Xpert Xpress SARS-CoV-2/FLU/RSV plus assay is intended as an aid in the diagnosis of influenza from Nasopharyngeal swab specimens and should not be used as a sole basis for treatment. Nasal washings and aspirates are unacceptable for Xpert Xpress SARS-CoV-2/FLU/RSV testing.  Fact Sheet for Patients: BloggerCourse.comhttps://www.fda.gov/media/152166/download  Fact Sheet for Healthcare Providers: SeriousBroker.ithttps://www.fda.gov/media/152162/download  This test is not yet approved or cleared by the [place] FDA and has been authorized for detection and/or diagnosis of SARS-CoV-2 by FDA under an Emergency Use Authorization (EUA). This EUA will remain in effect (meaning  this test can be used) for the duration of the COVID-19 declaration under Section 564(b)(1) of the Act, 21 U.S.C. section 360bbb-3(b)(1), unless the authorization is terminated or revoked.  Performed at [facility], [address] [place], [place] [address]   Culture, blood (single)     Status: None (Preliminary result)   Collection Time: 04/06/21  1:04 AM   Specimen: BLOOD RIGHT HAND  Result Value Ref Range Status   Specimen Description BLOOD RIGHT HAND  Final   Special Requests   Final    BOTTLES DRAWN AEROBIC AND ANAEROBIC Blood Culture adequate volume   Culture   Final    NO  GROWTH 2 DAYS Performed at [facility], [address], [place], [place] [address]    Report Status PENDING  Incomplete      Imaging Studies   No results found.   Medications   Scheduled Meds:  aspirin EC  81 mg Oral Daily   atorvastatin  80 mg Oral Daily   clopidogrel  75 mg Oral Daily   enoxaparin (LOVENOX) injection  40 mg Subcutaneous Q24H   insulin aspart  0-9 Units Subcutaneous TID WC   insulin detemir  30 Units Subcutaneous QHS   metoprolol tartrate  25 mg Oral BID   sertraline  50 mg Oral Daily   Continuous Infusions:     LOS: 5 days    Time spent: 20 minutes     Pennie Banter, DO [facility]  04/09/2021, 2:24 PM      If 7PM-7AM, please contact night-coverage. How to contact the [facility] Attending or Consulting provider 7A - 7P or covering provider during after hours 7P -7A, for this patient?    Check the care team in [facility] and look for a) attending/consulting TR[facility] provider listed and b) the [facility] team listed Log into [email or web address] and use [place]'s universal password to access. If you do not have the password, please contact the hospital operator. Locate the [facility] provider you are looking for under [facility] and page to a number that you can be directly reached. If you still have difficulty reaching the provider, please page the [facility]  (Director on Call) for the Hospitalists listed on amion for assistance.

## 2021-04-10 DIAGNOSIS — R471 Dysarthria and anarthria: Secondary | ICD-10-CM | POA: Diagnosis not present

## 2021-04-10 LAB — GLUCOSE, CAPILLARY
Glucose-Capillary: 131 mg/dL — ABNORMAL HIGH (ref 70–99)
Glucose-Capillary: 146 mg/dL — ABNORMAL HIGH (ref 70–99)
Glucose-Capillary: 172 mg/dL — ABNORMAL HIGH (ref 70–99)
Glucose-Capillary: 139 mg/dL — ABNORMAL HIGH (ref 70–99)

## 2021-04-10 NOTE — Progress Notes (Addendum)
  Progress Note    04/10/2021 7:47 AM Hospital Day 5  Subjective:  says her throat is sore when swallowing and feels like she has some swelling around her left cheek and neck.  Denies any numbness around this area.  Denies any speech issues or trouble moving any extremities.     Vitals:   04/10/21 0001 04/10/21 0341  BP: 129/71 127/60  Pulse: 63 61  Resp: 18 18  Temp: 98.5 F (36.9 C) 98.8 F (37.1 C)  SpO2: 93% 94%    Physical Exam: General  resting comfortably Lungs:  non labored Extremities:  moving all extremities equally.  CBC    Component Value Date/Time   WBC 9.3 04/06/2021 0104   RBC 4.17 04/06/2021 0104   HGB 10.5 (L) 04/06/2021 0104   HCT 34.5 (L) 04/06/2021 0104   PLT 231 04/06/2021 0104   MCV 82.7 04/06/2021 0104   MCH 25.2 (L) 04/06/2021 0104   MCHC 30.4 04/06/2021 0104   RDW 15.7 (H) 04/06/2021 0104   LYMPHSABS 1.9 04/05/2021 0011   MONOABS 0.9 04/05/2021 0011   EOSABS 0.2 04/05/2021 0011   BASOSABS 0.0 04/05/2021 0011    BMET    Component Value Date/Time   NA 135 04/06/2021 0104   K 4.2 04/06/2021 0104   CL 104 04/06/2021 0104   CO2 23 04/06/2021 0104   GLUCOSE 226 (H) 04/06/2021 0104   BUN 22 04/06/2021 0104   CREATININE 1.00 04/06/2021 0104   CALCIUM 9.0 04/06/2021 0104   GFRNONAA 58 (L) 04/06/2021 0104    INR    Component Value Date/Time   INR 1.1 04/04/2021 1953     Intake/Output Summary (Last 24 hours) at 04/10/2021 0747 Last data filed at 04/09/2021 0930 Gross per 24 hour  Intake 240 ml  Output --  Net 240 ml     Assessment/Plan:  80 y.o. female for right TCAR Wednesday with Dr. Erlene Baker Day 5  -continue statin/asa/plavix. -for right TCAR on Wednesday.   -PT/OT recommending close supervision (24 hr) at discharge.     Erin Massed, PA-C [facility] [phone] 04/10/2021 7:47 AM  I have seen and evaluated the patient. I agree with the PA note as documented above. No additional  neurologic events over the weekend.  Plan right TCAR on Wednesday for moderate 60-79% right ICA stenosis that is felt to be symptomatic.  Continue aspirin, plavix, and statin.  Erin Shelling, MD [facility] [place] Office: [phone]

## 2021-04-10 NOTE — Therapy (Signed)
Occupational Therapy Treatment Patient Details Name: Erin Baker MRN: [id number] DOB: Sep 25, 1940 Today's Date: 04/10/2021   History of present illness 80 y.o. female presenting to ED 9/6 with dysarthria. CT angio (+) plaque around carotid bifurcation. MRI with Rt frontal infarct. PMHx: CAD s/p stenting, Hx of CVA x5, thoracic endovascular aneurysm s/p repair,  subclavian artery repair, HTN, DMII and Hx of tobacco use.   OT comments  Pt seen today for ADL retraining session with focus on grooming while standing at sink at supervision level, toileting/transfers supervision-Min guard, washing hair while seated on EOB, ambulation in room and transfers to recliner chair. Briefly reviewed role of OT and goals verbally. Pt was not emotionally labile this date.   Recommendations for follow up therapy are one component of a multi-disciplinary discharge planning process, led by the attending physician.  Recommendations may be updated based on patient status, additional functional criteria and insurance authorization.    Follow Up Recommendations  Supervision/Assistance - 24 hour (Will require frequent supervision/Assist)    Equipment Recommendations  Other (comment) (TBD)    Recommendations for Other Services      Precautions / Restrictions Precautions Precautions: Fall Precaution Comments: Decreased awareness/insight; emotionally labile       Mobility Bed Mobility Overal bed mobility: Modified Independent Bed Mobility: Supine to Sit     Supine to sit: Modified independent (Device/Increase time);HOB elevated     General bed mobility comments: HOB 20 degrees without assist    Transfers Overall transfer level: Modified independent Equipment used: None Transfers: Sit to/from Stand Sit to Stand: Modified independent (Device/Increase time);Supervision         General transfer comment: pt able to stand and sit with good control without assist    Balance Overall balance assessment:  Needs assistance Sitting-balance support: Feet supported Sitting balance-[name] Scale: Good     Standing balance support: No upper extremity supported;Single extremity supported Standing balance-[name] Scale: Fair Standing balance comment: pt able to walk without UE support with deficits noted. Pt standing at sink for grooming    ADL either performed or assessed with clinical judgement   ADL Overall ADL's : Needs assistance/impaired Eating/Feeding: Independent;Bed level;Sitting   Grooming: Supervision/safety;Standing;Wash/dry hands;Wash/dry face;Oral care;Brushing hair   Upper Body Bathing: Set up;Sitting     Toilet Transfer: Supervision/safety;Ambulation   Toileting- Clothing Manipulation and Hygiene: Supervision/safety;Sitting/lateral lean;Sit to/from stand       Functional mobility during ADLs: Supervision/safety;Min guard General ADL Comments: Pt participated in ADL retraining session to include grooming standing at sink, toileting/transfers, washing hair while seated on EOB, ambulation in room and transfers to recliner chair. Briefly discussed role of OT and goals verbally    Cognition Arousal/Alertness: Awake/alert Behavior During Therapy: WFL for tasks assessed/performed Overall Cognitive Status: Within Functional Limits for tasks assessed                                                General Comments HR 67, pulse 67, O2 99% on RA, RR 14    Pertinent Vitals/ Pain       Pain Assessment: No/denies pain Pain Score: 0-No pain   Frequency  Min 2X/week        Progress Toward Goals  OT Goals(current goals can now be found in the care plan section)  Progress towards OT goals: Progressing toward goals  Acute Rehab OT Goals Patient Stated Goal: To  go home OT Goal Formulation: With patient Time For Goal Achievement: 04/19/21 Potential to Achieve Goals: Good  Plan Discharge plan remains appropriate;Frequency remains appropriate       AM-PAC  OT "6 Clicks" Daily Activity     Outcome Measure   Help from another person eating meals?: None Help from another person taking care of personal grooming?: A Little Help from another person toileting, which includes using toliet, bedpan, or urinal?: None Help from another person bathing (including washing, rinsing, drying)?: A Little Help from another person to put on and taking off regular upper body clothing?: None Help from another person to put on and taking off regular lower body clothing?: A Little 6 Click Score: 21    End of Session    OT Visit Diagnosis: Other symptoms and signs involving cognitive function   Activity Tolerance Patient tolerated treatment well   Patient Left in chair;with call bell/phone within reach   Nurse Communication          Time: 1610-9604 OT Time Calculation (min): 27 min  Charges: OT General Charges $OT Visit: 1 Visit OT Treatments $Self Care/Home Management : 23-37 mins  Tisa Weisel Beth Dixon, OTR/L 04/10/2021, 10:11 AM

## 2021-04-10 NOTE — Progress Notes (Signed)
PROGRESS NOTE    Erin Baker   JQB:[id number]  DOB: 1940-11-17  PCP: Pcp, No    DOA: 04/04/2021 LOS: 6    Brief Narrative / Hospital Course to Date:   80 year old female with past medical history of CAD status post stenting, thoracic endovascular aneurysm repair, subclavian artery repair, hypertension, diabetes who presented with dysarthria that was noted on the phone while she was talking with a family member.    Noncontrast CT head was unremarkable.  Telemetry neurologist was consulted and once metabolic reasons for dysarthria were ruled out, brain MRI was obtained which showed a small right frontal infarct.  CTA of head and neck did show some carotid stenosis which was confirmed on carotid duplex ultrasound.  Vascular surgery was consulted and plan for intervention on Wednesday 9/14.  Patient has been evaluated by PT and OT, both recommending strict 24-hour supervision and assistance after discharge.  Patient [place] alone and daughter out of state.  They are exploring caregiver options.  Assessment & Plan   Principal Problem:   Dysarthria Active Problems:   CAD (coronary artery disease)   Hypertension   Normocytic anemia   Type 2 diabetes mellitus with vascular disease (HCC)   Cerebral embolism with cerebral infarction   Acute ischemic right frontal lobe stroke presenting with dysarthria -deficits and resolved.  Brain MRI also showed numerous chronic supra and infratentorial infarcts of unclear etiology.  Patient had a loop recorder placed in 2019 which reportedly has not showed any A. fib. 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 --Management per stroke team --Telemetry --DAPT with aspirin and Plavix --Loop recorder interrogated and noted battery fully depleted --PT and OT recommend strict 24-hour supervision and assistance after discharge but  no rehab needs.  Supervision needed due to poor executive function, see their notes.  Patient appears to have poor short-term memory and would have difficulty managing her medications etc. unclear if any underlying history of cognitive impairment.  Right ICA stenosis -Vascular surgery consulted, plan for TCAR next Wednesday  History of prior strokes -with multiple chronic infarcts in multiple territories including cerebellar.  Continue work-up for A. Fib.  Otherwise management as above per neuro.  Lower extremity edema -takes 20 mg oral Lasix as needed at home. --Single dose Lasix 20 mg p.o. today --Monitor for swelling and give further Lasix if needed  Hypertension -BP stable.  Per neuro, BP goal 130-160 before carotid intervention.  Hyperlipidemia -continue Lipitor 80 mg.  Goal LDL less than 70.  Uncontrolled type 2 diabetes -A1c 7.7%.  Continue sliding scale NovoLog.  ?  Cognitive impairment with poor short-term memory versus acute encephalopathy related to stroke -see OT notes regarding patient's current executive functions. --Requires 24-hour supervision and assistance after discharge from the hospital.  Patient BMI: Body mass index is 27.48 kg/m.   DVT prophylaxis: enoxaparin (LOVENOX) injection 40 mg Start: 04/05/21 1000   Diet:  Diet Orders (From admission, onward)     Start     Ordered   04/07/21 1131  Diet regular Room service appropriate? Yes; Fluid consistency: Thin  Diet effective now       Question Answer Comment  Room service appropriate? Yes   Fluid consistency: Thin      04/07/21 1130              Code Status: Full Code   Subjective 04/10/21    Patient up in recliner  when seen this morning.  States she feels well.  Eager to have procedure and get home.  No acute complaints.  Said Lasix helped her leg swelling a lot.  Earlier AM, she had neck pain which is better now.   Disposition Plan & Communication   Status is: Inpatient  Remains inpatient  appropriate because:Ongoing diagnostic testing needed not appropriate for outpatient work up.  Carotid procedure planned 9/14.  Patient requires 24-hour supervision assistance and lives alone.  Dispo: The patient is from: Home              Anticipated d/c is to: Home versus SNF depending on 24-hour help available              Patient currently is not medically stable to d/c.   Difficult to place patient No   Family Communication: daughter visiting from [place] at bedside on rounds 9/9.   Consults, Procedures, Significant Events   Consultants:  Neurology Vascular surgery  Procedures:  None  Antimicrobials:  Anti-infectives (From admission, onward)    None         Micro    Objective   Vitals:   04/09/21 2051 04/10/21 0001 04/10/21 0341 04/10/21 0820  BP:  129/71 127/60 111/73  Pulse: 66 63 61 (!) 56  Resp:  18 18 18   Temp:  98.5 F (36.9 C) 98.8 F (37.1 C) 97.8 F (36.6 C)  TempSrc:  Oral Oral Oral  SpO2:  93% 94% 96%  Weight:      Height:       No intake or output data in the 24 hours ending 04/10/21 1654   Filed Weights   04/04/21 1945 04/04/21 2355  Weight: 74.8 kg 74.9 kg    Physical Exam:  General exam: awake, alert, no acute distress Respiratory system: lungs clear, no wheezes or rhonchi, normal respiratory effort, on room air Cardiovascular system: RRR, S1/S2+, no pedal edema.   Central nervous system: A&O x3.  No gross focal neurologic deficits, normal speech   Labs   Data Reviewed: I have personally reviewed following labs and imaging studies  CBC: Recent Labs  Lab 04/04/21 1953 04/05/21 0011 04/06/21 0104  WBC 10.8* 9.1 9.3  NEUTROABS 7.1 6.0  --   HGB 11.9* 10.7* 10.5*  HCT 37.6 35.0* 34.5*  MCV 83.2 82.9 82.7  PLT 274 231 231   Basic Metabolic Panel: Recent Labs  Lab 04/04/21 1953 04/05/21 0417 04/06/21 0104  NA 136  --  135  K 4.0  --  4.2  CL 101  --  104  CO2 27  --  23  GLUCOSE 168*  --  226*  BUN 26*  --  22   CREATININE 1.09* 0.98 1.00  CALCIUM 9.4  --  9.0   GFR: Estimated Creatinine Clearance: 47 mL/min (by C-G formula based on SCr of 1 mg/dL). Liver Function Tests: Recent Labs  Lab 04/04/21 1953  AST 17  ALT 10  ALKPHOS 96  BILITOT 0.7  PROT 6.8  ALBUMIN 3.6   No results for input(s): LIPASE, AMYLASE in the last 168 hours. No results for input(s): AMMONIA in the last 168 hours. Coagulation Profile: Recent Labs  Lab 04/04/21 1953  INR 1.1   Cardiac Enzymes: No results for input(s): CKTOTAL, CKMB, CKMBINDEX, TROPONINI in the last 168 hours. BNP (last 3 results) No results for input(s): PROBNP in the last 8760 hours. HbA1C: No results for input(s): HGBA1C in the last 72 hours.  CBG: Recent Labs  Lab 04/09/21 1832 04/09/21 2042 04/10/21 0532 04/10/21 1252 04/10/21 1613  GLUCAP 218* 178* 131* 146* 172*   Lipid Profile: No results for input(s): CHOL, HDL, LDLCALC, TRIG, CHOLHDL, LDLDIRECT in the last 72 hours.  Thyroid Function Tests: No results for input(s): TSH, T4TOTAL, FREET4, T3FREE, THYROIDAB in the last 72 hours. Anemia Panel: No results for input(s): VITAMINB12, FOLATE, FERRITIN, TIBC, IRON, RETICCTPCT in the last 72 hours. Sepsis Labs: No results for input(s): PROCALCITON, LATICACIDVEN in the last 168 hours.  Recent Results (from the past 240 hour(s))  Resp Panel by RT-PCR (Flu A&B, Covid) Nasopharyngeal Swab     Status: None   Collection Time: 04/04/21  7:53 PM   Specimen: Nasopharyngeal Swab; Nasopharyngeal(NP) swabs in vial transport medium  Result Value Ref Range Status   SARS Coronavirus 2 by RT PCR NEGATIVE NEGATIVE Final    Comment: (NOTE) SARS-CoV-2 target nucleic acids are NOT DETECTED.  The SARS-CoV-2 RNA is generally detectable in upper respiratory specimens during the acute phase of infection. The lowest concentration of SARS-CoV-2 viral copies this assay can detect is 138 copies/mL. A negative result does not preclude  SARS-Cov-2 infection and should not be used as the sole basis for treatment or other patient management decisions. A negative result may occur with  improper specimen collection/handling, submission of specimen other than nasopharyngeal swab, presence of viral mutation(s) within the areas targeted by this assay, and inadequate number of viral copies(<138 copies/mL). A negative result must be combined with clinical observations, patient history, and epidemiological information. The expected result is Negative.  Fact Sheet for Patients:  BloggerCourse.comhttps://www.fda.gov/media/152166/download  Fact Sheet for Healthcare Providers:  SeriousBroker.ithttps://www.fda.gov/media/152162/download  This test is no t yet approved or cleared by the [place] FDA and  has been authorized for detection and/or diagnosis of SARS-CoV-2 by FDA under an Emergency Use Authorization (EUA). This EUA will remain  in effect (meaning this test can be used) for the duration of the COVID-19 declaration under Section 564(b)(1) of the Act, 21 U.S.C.section 360bbb-3(b)(1), unless the authorization is terminated  or revoked sooner.       Influenza A by PCR NEGATIVE NEGATIVE Final   Influenza B by PCR NEGATIVE NEGATIVE Final    Comment: (NOTE) The Xpert Xpress SARS-CoV-2/FLU/RSV plus assay is intended as an aid in the diagnosis of influenza from Nasopharyngeal swab specimens and should not be used as a sole basis for treatment. Nasal washings and aspirates are unacceptable for Xpert Xpress SARS-CoV-2/FLU/RSV testing.  Fact Sheet for Patients: BloggerCourse.comhttps://www.fda.gov/media/152166/download  Fact Sheet for Healthcare Providers: SeriousBroker.ithttps://www.fda.gov/media/152162/download  This test is not yet approved or cleared by the [place] FDA and has been authorized for detection and/or diagnosis of SARS-CoV-2 by FDA under an Emergency Use Authorization (EUA). This EUA will remain in effect (meaning this test can be used) for the duration of  the COVID-19 declaration under Section 564(b)(1) of the Act, 21 U.S.C. section 360bbb-3(b)(1), unless the authorization is terminated or revoked.  Performed at [facility] [place], [address] [place], [place] [id number]   Culture, blood (single)     Status: None (Preliminary result)   Collection Time: 04/06/21  1:04 AM   Specimen: BLOOD RIGHT HAND  Result Value Ref Range Status   Specimen Description BLOOD RIGHT HAND  Final   Special Requests   Final    BOTTLES DRAWN AEROBIC AND ANAEROBIC Blood Culture adequate volume   Culture   Final    NO GROWTH 4 DAYS Performed at [facility],  [address] [place], [place] [address]    Report Status PENDING  Incomplete      Imaging Studies   No results found.   Medications   Scheduled Meds:  aspirin EC  81 mg Oral Daily   atorvastatin  80 mg Oral Daily   clopidogrel  75 mg Oral Daily   enoxaparin (LOVENOX) injection  40 mg Subcutaneous Q24H   insulin aspart  0-9 Units Subcutaneous TID WC   insulin detemir  30 Units Subcutaneous QHS   metoprolol tartrate  25 mg Oral BID   sertraline  50 mg Oral Daily   Continuous Infusions:     LOS: 6 days    Time spent: 20 minutes     Pennie Banter, DO [facility]  04/10/2021, 4:54 PM      If 7PM-7AM, please contact night-coverage. How to contact the [facility] Attending or Consulting provider 7A - 7P or covering provider during after hours 7P -7A, for this patient?    Check the care team in [facility] and look for a) attending/consulting [facility] provider listed and b) the [facility] team listed Log into [email or web address] and use [place]'s universal password to access. If you do not have the password, please contact the hospital operator. Locate the [facility] provider you are looking for under [facility] and page to a number that you can be directly reached. If you still have difficulty reaching the provider, please page the [facility] (Director on Call) for the Hospitalists listed  on amion for assistance.

## 2021-04-11 DIAGNOSIS — R471 Dysarthria and anarthria: Secondary | ICD-10-CM | POA: Diagnosis not present

## 2021-04-11 DIAGNOSIS — I63231 Cerebral infarction due to unspecified occlusion or stenosis of right carotid arteries: Secondary | ICD-10-CM

## 2021-04-11 LAB — BASIC METABOLIC PANEL
Anion gap: 6 (ref 5–15)
BUN: 24 mg/dL — ABNORMAL HIGH (ref 8–23)
CO2: 27 mmol/L (ref 22–32)
Calcium: 8.5 mg/dL — ABNORMAL LOW (ref 8.9–10.3)
Chloride: 103 mmol/L (ref 98–111)
Creatinine, Ser: 1.03 mg/dL — ABNORMAL HIGH (ref 0.44–1.00)
GFR, Estimated: 56 mL/min — ABNORMAL LOW (ref >60)
Glucose, Bld: 122 mg/dL — ABNORMAL HIGH (ref 70–99)
Potassium: 4.2 mmol/L (ref 3.5–5.1)
Sodium: 136 mmol/L (ref 135–145)

## 2021-04-11 LAB — MAGNESIUM: Magnesium: 1.7 mg/dL (ref 1.7–2.4)

## 2021-04-11 LAB — CBC
HCT: 34.5 % — ABNORMAL LOW (ref 36.0–46.0)
Hemoglobin: 10.7 g/dL — ABNORMAL LOW (ref 12.0–15.0)
MCH: 25.7 pg — ABNORMAL LOW (ref 26.0–34.0)
MCHC: 31 g/dL (ref 30.0–36.0)
MCV: 82.7 fL (ref 80.0–100.0)
Platelets: 240 10*3/uL (ref 150–400)
RBC: 4.17 MIL/uL (ref 3.87–5.11)
RDW: 16.1 % — ABNORMAL HIGH (ref 11.5–15.5)
WBC: 10.6 10*3/uL — ABNORMAL HIGH (ref 4.0–10.5)
nRBC: 0 % (ref 0.0–0.2)

## 2021-04-11 LAB — GLUCOSE, CAPILLARY
Glucose-Capillary: 80 mg/dL (ref 70–99)
Glucose-Capillary: 167 mg/dL — ABNORMAL HIGH (ref 70–99)
Glucose-Capillary: 134 mg/dL — ABNORMAL HIGH (ref 70–99)
Glucose-Capillary: 180 mg/dL — ABNORMAL HIGH (ref 70–99)

## 2021-04-11 LAB — CULTURE, BLOOD (SINGLE)
Culture: NO GROWTH
Special Requests: ADEQUATE

## 2021-04-11 NOTE — Progress Notes (Signed)
PROGRESS NOTE    Erin Baker   QQI:[id number]  DOB: 15-May-1941  PCP: Pcp, No    DOA: 04/04/2021 LOS: 7    Brief Narrative / Hospital Course to Date:   79 year old female with past medical history of CAD status post stenting, thoracic endovascular aneurysm repair, subclavian artery repair, hypertension, diabetes who presented with dysarthria that was noted on the phone while she was talking with a family member.    Noncontrast CT head was unremarkable.  Telemetry neurologist was consulted and once metabolic reasons for dysarthria were ruled out, brain MRI was obtained which showed a small right frontal infarct.  CTA of head and neck did show some carotid stenosis which was confirmed on carotid duplex ultrasound.  Vascular surgery was consulted and plan for intervention on Wednesday 9/14.  Patient has been evaluated by PT and OT, both recommending strict 24-hour supervision and assistance after discharge.  Patient lives alone and daughter out of state.  They are exploring caregiver options.  Assessment & Plan   Principal Problem:   Dysarthria Active Problems:   CAD (coronary artery disease)   Hypertension   Normocytic anemia   Type 2 diabetes mellitus with vascular disease (HCC)   Cerebral embolism with cerebral infarction   Acute ischemic right frontal lobe stroke presenting with dysarthria -deficits and resolved.  Brain MRI also showed numerous chronic supra and infratentorial infarcts of unclear etiology.  Patient had a loop recorder placed in 2019 which reportedly has not showed any A. fib. 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 --Management per stroke team --Telemetry --DAPT with aspirin and Plavix --Loop recorder interrogated and noted battery fully depleted --PT and OT recommend strict 24-hour supervision and assistance after discharge but  no rehab needs.  Supervision needed due to poor executive function, see their notes.  Patient appears to have poor short-term memory and would have difficulty managing her medications etc. unclear if any underlying history of cognitive impairment.  Right ICA stenosis -Vascular surgery consulted, plan for TCAR next Wednesday  History of prior strokes -with multiple chronic infarcts in multiple territories including cerebellar.  Continue work-up for A. Fib.  Otherwise management as above per neuro.  Lower extremity edema -takes 20 mg oral Lasix as needed at home. --Single dose Lasix 20 mg p.o. today --Monitor for swelling and give further Lasix if needed  Hypertension -BP stable.  Per neuro, BP goal 130-160 before carotid intervention.  Hyperlipidemia -continue Lipitor 80 mg.  Goal LDL less than 70.  Uncontrolled type 2 diabetes -A1c 7.7%.  Continue sliding scale NovoLog.  ?  Cognitive impairment with poor short-term memory versus acute encephalopathy related to stroke -see OT notes regarding patient's current executive functions. --Requires 24-hour supervision and assistance after discharge from the hospital.  Patient BMI: Body mass index is 27.48 kg/m.   DVT prophylaxis: enoxaparin (LOVENOX) injection 40 mg Start: 04/05/21 1000   Diet:  Diet Orders (From admission, onward)     Start     Ordered   04/12/21 0001  Diet NPO time specified  Diet effective midnight        04/11/21 0748   04/07/21 1131  Diet regular Room service appropriate? Yes; Fluid consistency: Thin  Diet effective now       Question Answer Comment  Room service appropriate? Yes   Fluid consistency: Thin      04/07/21 1130  Code Status: Full Code   Subjective 04/11/21    Patient up in recliner when seen this morning.  States she feels well.  Eager to have procedure and get home.  No acute complaints.  Said Lasix helped her leg swelling a lot.  Earlier AM, she had neck pain which is better  now.   Disposition Plan & Communication   Status is: Inpatient  Remains inpatient appropriate because:Ongoing diagnostic testing needed not appropriate for outpatient work up.  Carotid procedure planned 9/14.  Patient requires 24-hour supervision assistance and lives alone.  Dispo: The patient is from: Home              Anticipated d/c is to: Home versus SNF depending on 24-hour help available              Patient currently is not medically stable to d/c.   Difficult to place patient No   Family Communication: daughter visiting from [place] at bedside on rounds 9/9.   Consults, Procedures, Significant Events   Consultants:  Neurology Vascular surgery  Procedures:  None  Antimicrobials:  Anti-infectives (From admission, onward)    None         Micro    Objective   Vitals:   04/10/21 2338 04/11/21 0500 04/11/21 0751 04/11/21 1153  BP: 103/64 (!) 119/58 (!) 101/53 129/67  Pulse: 61 60 60 61  Resp: 19 13 18 19   Temp: (!) 97.4 F (36.3 C) (!) 97.5 F (36.4 C) (!) 97.5 F (36.4 C) (!) 97.4 F (36.3 C)  TempSrc: Oral Oral Oral Oral  SpO2: 100% 96% 100% 100%  Weight:      Height:        Intake/Output Summary (Last 24 hours) at 04/11/2021 1539 Last data filed at 04/11/2021 04/13/2021 Gross per 24 hour  Intake 240 ml  Output --  Net 240 ml     Filed Weights   04/04/21 1945 04/04/21 2355  Weight: 74.8 kg 74.9 kg    Physical Exam:  General exam: awake, alert, no acute distress Respiratory system: lungs clear, no wheezes or rhonchi, normal respiratory effort, on room air Cardiovascular system: RRR, S1/S2+, no pedal edema.   Central nervous system: A&O x3.  No gross focal neurologic deficits, normal speech   Labs   Data Reviewed: I have personally reviewed following labs and imaging studies  CBC: Recent Labs  Lab 04/04/21 1953 04/05/21 0011 04/06/21 0104 04/11/21 0123  WBC 10.8* 9.1 9.3 10.6*  NEUTROABS 7.1 6.0  --   --   HGB 11.9* 10.7* 10.5*  10.7*  HCT 37.6 35.0* 34.5* 34.5*  MCV 83.2 82.9 82.7 82.7  PLT 274 231 231 240   Basic Metabolic Panel: Recent Labs  Lab 04/04/21 1953 04/05/21 0417 04/06/21 0104 04/11/21 0123  NA 136  --  135 136  K 4.0  --  4.2 4.2  CL 101  --  104 103  CO2 27  --  23 27  GLUCOSE 168*  --  226* 122*  BUN 26*  --  22 24*  CREATININE 1.09* 0.98 1.00 1.03*  CALCIUM 9.4  --  9.0 8.5*  MG  --   --   --  1.7   GFR: Estimated Creatinine Clearance: 45.6 mL/min (A) (by C-G formula based on SCr of 1.03 mg/dL (H)). Liver Function Tests: Recent Labs  Lab 04/04/21 1953  AST 17  ALT 10  ALKPHOS 96  BILITOT 0.7  PROT 6.8  ALBUMIN 3.6   No results  for input(s): LIPASE, AMYLASE in the last 168 hours. No results for input(s): AMMONIA in the last 168 hours. Coagulation Profile: Recent Labs  Lab 04/04/21 1953  INR 1.1   Cardiac Enzymes: No results for input(s): CKTOTAL, CKMB, CKMBINDEX, TROPONINI in the last 168 hours. BNP (last 3 results) No results for input(s): PROBNP in the last 8760 hours. HbA1C: No results for input(s): HGBA1C in the last 72 hours.  CBG: Recent Labs  Lab 04/10/21 1252 04/10/21 1613 04/10/21 2120 04/11/21 0621 04/11/21 1151  GLUCAP 146* 172* 139* 80 167*   Lipid Profile: No results for input(s): CHOL, HDL, LDLCALC, TRIG, CHOLHDL, LDLDIRECT in the last 72 hours.  Thyroid Function Tests: No results for input(s): TSH, T4TOTAL, FREET4, T3FREE, THYROIDAB in the last 72 hours. Anemia Panel: No results for input(s): VITAMINB12, FOLATE, FERRITIN, TIBC, IRON, RETICCTPCT in the last 72 hours. Sepsis Labs: No results for input(s): PROCALCITON, LATICACIDVEN in the last 168 hours.  Recent Results (from the past 240 hour(s))  Resp Panel by RT-PCR (Flu A&B, Covid) Nasopharyngeal Swab     Status: None   Collection Time: 04/04/21  7:53 PM   Specimen: Nasopharyngeal Swab; Nasopharyngeal(NP) swabs in vial transport medium  Result Value Ref Range Status   SARS Coronavirus  2 by RT PCR NEGATIVE NEGATIVE Final    Comment: (NOTE) SARS-CoV-2 target nucleic acids are NOT DETECTED.  The SARS-CoV-2 RNA is generally detectable in upper respiratory specimens during the acute phase of infection. The lowest concentration of SARS-CoV-2 viral copies this assay can detect is 138 copies/mL. A negative result does not preclude SARS-Cov-2 infection and should not be used as the sole basis for treatment or other patient management decisions. A negative result may occur with  improper specimen collection/handling, submission of specimen other than nasopharyngeal swab, presence of viral mutation(s) within the areas targeted by this assay, and inadequate number of viral copies(<138 copies/mL). A negative result must be combined with clinical observations, patient history, and epidemiological information. The expected result is Negative.  Fact Sheet for Patients:  BloggerCourse.com  Fact Sheet for Healthcare Providers:  SeriousBroker.it  This test is no t yet approved or cleared by the [place] FDA and  has been authorized for detection and/or diagnosis of SARS-CoV-2 by FDA under an Emergency Use Authorization (EUA). This EUA will remain  in effect (meaning this test can be used) for the duration of the COVID-19 declaration under Section 564(b)(1) of the Act, 21 U.S.C.section 360bbb-3(b)(1), unless the authorization is terminated  or revoked sooner.       Influenza A by PCR NEGATIVE NEGATIVE Final   Influenza B by PCR NEGATIVE NEGATIVE Final    Comment: (NOTE) The Xpert Xpress SARS-CoV-2/FLU/RSV plus assay is intended as an aid in the diagnosis of influenza from Nasopharyngeal swab specimens and should not be used as a sole basis for treatment. Nasal washings and aspirates are unacceptable for Xpert Xpress SARS-CoV-2/FLU/RSV testing.  Fact Sheet for Patients: BloggerCourse.com  Fact  Sheet for Healthcare Providers: SeriousBroker.it  This test is not yet approved or cleared by the [place] FDA and has been authorized for detection and/or diagnosis of SARS-CoV-2 by FDA under an Emergency Use Authorization (EUA). This EUA will remain in effect (meaning this test can be used) for the duration of the COVID-19 declaration under Section 564(b)(1) of the Act, 21 U.S.C. section 360bbb-3(b)(1), unless the authorization is terminated or revoked.  Performed at [facility], [address] [place], [place] [address]   Culture,  blood (single)     Status: None   Collection Time: 04/06/21  1:04 AM   Specimen: BLOOD RIGHT HAND  Result Value Ref Range Status   Specimen Description BLOOD RIGHT HAND  Final   Special Requests   Final    BOTTLES DRAWN AEROBIC AND ANAEROBIC Blood Culture adequate volume   Culture   Final    NO GROWTH 5 DAYS Performed at [facility], [address] [place], [place] [address]    Report Status 04/11/2021 FINAL  Final      Imaging Studies   No results found.   Medications   Scheduled Meds:  aspirin EC  81 mg Oral Daily   atorvastatin  80 mg Oral Daily   clopidogrel  75 mg Oral Daily   enoxaparin (LOVENOX) injection  40 mg Subcutaneous Q24H   insulin aspart  0-9 Units Subcutaneous TID WC   insulin detemir  30 Units Subcutaneous QHS   metoprolol tartrate  25 mg Oral BID   sertraline  50 mg Oral Daily   Continuous Infusions:     LOS: 7 days    Time spent: 20 minutes     Pennie Banter, DO [facility]  04/11/2021, 3:39 PM      If 7PM-7AM, please contact night-coverage. How to contact the [facility] Attending or Consulting provider 7A - 7P or covering provider during after hours 7P -7A, for this patient?    Check the care team in [facility] and look for a) attending/consulting TRH provider listed and b) the [facility] team listed Log into [email or web address] and use [employer]'s universal  password to access. If you do not have the password, please contact the hospital operator. Locate the [facility] provider you are looking for under [facility] and page to a number that you can be directly reached. If you still have difficulty reaching the provider, please page the [facility] (Director on Call) for the Hospitalists listed on amion for assistance.  PROGRESS NOTE    Erin Baker   BMW:[id number]  DOB: 1940/11/05  PCP: Pcp, No    DOA: 04/04/2021 LOS: 7    Brief Narrative / Hospital Course to Date:   80 year old female with past medical history of CAD status post stenting, thoracic endovascular aneurysm repair, subclavian artery repair, hypertension, diabetes who presented with dysarthria that was noted on the phone while she was talking with a family member.    Noncontrast CT head was unremarkable.  Telemetry neurologist was consulted and once metabolic reasons for dysarthria were ruled out, brain MRI was obtained which showed a small right frontal infarct.  CTA of head and neck did show some carotid stenosis which was confirmed on carotid duplex ultrasound.  Vascular surgery was consulted and plan for intervention on Wednesday 9/14.  Patient has been evaluated by PT and OT, both recommending strict 24-hour supervision and assistance after discharge.  Patient lives alone and daughter out of state.  They are exploring caregiver options.  Assessment & Plan   Principal Problem:   Dysarthria Active Problems:   CAD (coronary artery disease)   Hypertension   Normocytic anemia   Type 2 diabetes mellitus with vascular disease (HCC)   Cerebral embolism with cerebral infarction   Acute ischemic right frontal lobe stroke presenting with dysarthria -deficits and resolved.  Brain MRI also showed numerous chronic supra and infratentorial infarcts of unclear etiology.  Patient had a loop recorder placed in 2019 which reportedly has not showed any A. fib. Stroke risk factors include  age,  prior smoker, 40-pack-year history, CAD status post stent and thoracic aortic aneurysm status postrepair and left carotid-subclavian bypass. --Management per stroke team --Telemetry --DAPT with aspirin and Plavix --Loop recorder interrogated and noted battery fully depleted --PT and OT recommend strict 24-hour supervision and assistance after discharge but no rehab needs.  Supervision needed due to poor executive function, see their notes.  Patient appears to have poor short-term memory and would have difficulty managing her medications etc. unclear if any underlying history of cognitive impairment.  Right ICA stenosis -Vascular surgery consulted,  TCAR planned for tomorrow, Wednesday 9/14. NPO after midnight  History of prior strokes -with multiple chronic infarcts in multiple territories including cerebellar.  Continue work-up for A. Fib.  Otherwise management as above per neuro.  Lower extremity edema - resolved Takes 20 mg oral Lasix as needed at home. Single dose Lasix 20 mg given 9/10. --Monitor for swelling and give further Lasix if needed  Hypertension -BP stable.  Per neuro, BP goal 130-160 before carotid intervention.  Hyperlipidemia -continue Lipitor 80 mg.  Goal LDL less than 70.  Uncontrolled type 2 diabetes -A1c 7.7%.  Continue sliding scale NovoLog.  ?  Cognitive impairment with poor short-term memory versus acute encephalopathy related to stroke -see OT notes regarding patient's current executive functions. --Requires 24-hour supervision and assistance after discharge from the hospital.  Patient BMI: Body mass index is 27.48 kg/m.   DVT prophylaxis: enoxaparin (LOVENOX) injection 40 mg Start: 04/05/21 1000   Diet:  Diet Orders (From admission, onward)     Start     Ordered   04/12/21 0001  Diet NPO time specified  Diet effective midnight        04/11/21 0748   04/07/21 1131  Diet regular Room service appropriate? Yes; Fluid consistency: Thin  Diet effective now        Question Answer Comment  Room service appropriate? Yes   Fluid consistency: Thin      04/07/21 1130              Code Status: Full Code   Subjective 04/11/21    Patient up in recliner when seen this morning.  Reports she feels well.  Anxious to get procedure over and get home.  She denies any acute complaints.    Disposition Plan & Communication   Status is: Inpatient  Remains inpatient appropriate because:Ongoing diagnostic testing needed not appropriate for outpatient work up.  Carotid procedure planned 9/14.  Patient requires 24-hour supervision assistance and lives alone.  Dispo: The patient is from: Home              Anticipated d/c is to: Home versus SNF depending on 24-hour help available              Patient currently is not medically stable to d/c.   Difficult to place patient No   Family Communication: daughter visiting from [place] at bedside on rounds 9/9.     Consults, Procedures, Significant Events   Consultants:  Neurology Vascular surgery  Procedures:  None  Antimicrobials:  Anti-infectives (From admission, onward)    None         Micro    Objective   Vitals:   04/10/21 2338 04/11/21 0500 04/11/21 0751 04/11/21 1153  BP: 103/64 (!) 119/58 (!) 101/53 129/67  Pulse: 61 60 60 61  Resp: 19 13 18 19   Temp: (!) 97.4 F (36.3 C) (!) 97.5 F (36.4 C) (!) 97.5 F (36.4 C) (!) 97.4  F (36.3 C)  TempSrc: Oral Oral Oral Oral  SpO2: 100% 96% 100% 100%  Weight:      Height:        Intake/Output Summary (Last 24 hours) at 04/11/2021 1539 Last data filed at 04/11/2021 1610 Gross per 24 hour  Intake 240 ml  Output --  Net 240 ml     Filed Weights   04/04/21 1945 04/04/21 2355  Weight: 74.8 kg 74.9 kg    Physical Exam:  General exam: up in recliner, awake, alert, no acute distress Respiratory system: normal respiratory effort, on room air Cardiovascular system: RRR, S1/S2+, no pedal edema.   Central nervous system: A&O x3.   No gross focal neurologic deficits, normal speech   Labs   Data Reviewed: I have personally reviewed following labs and imaging studies  CBC: Recent Labs  Lab 04/04/21 1953 04/05/21 0011 04/06/21 0104 04/11/21 0123  WBC 10.8* 9.1 9.3 10.6*  NEUTROABS 7.1 6.0  --   --   HGB 11.9* 10.7* 10.5* 10.7*  HCT 37.6 35.0* 34.5* 34.5*  MCV 83.2 82.9 82.7 82.7  PLT 274 231 231 240   Basic Metabolic Panel: Recent Labs  Lab 04/04/21 1953 04/05/21 0417 04/06/21 0104 04/11/21 0123  NA 136  --  135 136  K 4.0  --  4.2 4.2  CL 101  --  104 103  CO2 27  --  23 27  GLUCOSE 168*  --  226* 122*  BUN 26*  --  22 24*  CREATININE 1.09* 0.98 1.00 1.03*  CALCIUM 9.4  --  9.0 8.5*  MG  --   --   --  1.7   GFR: Estimated Creatinine Clearance: 45.6 mL/min (A) (by C-G formula based on SCr of 1.03 mg/dL (H)). Liver Function Tests: Recent Labs  Lab 04/04/21 1953  AST 17  ALT 10  ALKPHOS 96  BILITOT 0.7  PROT 6.8  ALBUMIN 3.6   No results for input(s): LIPASE, AMYLASE in the last 168 hours. No results for input(s): AMMONIA in the last 168 hours. Coagulation Profile: Recent Labs  Lab 04/04/21 1953  INR 1.1   Cardiac Enzymes: No results for input(s): CKTOTAL, CKMB, CKMBINDEX, TROPONINI in the last 168 hours. BNP (last 3 results) No results for input(s): PROBNP in the last 8760 hours. HbA1C: No results for input(s): HGBA1C in the last 72 hours.  CBG: Recent Labs  Lab 04/10/21 1252 04/10/21 1613 04/10/21 2120 04/11/21 0621 04/11/21 1151  GLUCAP 146* 172* 139* 80 167*   Lipid Profile: No results for input(s): CHOL, HDL, LDLCALC, TRIG, CHOLHDL, LDLDIRECT in the last 72 hours.  Thyroid Function Tests: No results for input(s): TSH, T4TOTAL, FREET4, T3FREE, THYROIDAB in the last 72 hours. Anemia Panel: No results for input(s): VITAMINB12, FOLATE, FERRITIN, TIBC, IRON, RETICCTPCT in the last 72 hours. Sepsis Labs: No results for input(s): PROCALCITON, LATICACIDVEN in the last  168 hours.  Recent Results (from the past 240 hour(s))  Resp Panel by RT-PCR (Flu A&B, Covid) Nasopharyngeal Swab     Status: None   Collection Time: 04/04/21  7:53 PM   Specimen: Nasopharyngeal Swab; Nasopharyngeal(NP) swabs in vial transport medium  Result Value Ref Range Status   SARS Coronavirus 2 by RT PCR NEGATIVE NEGATIVE Final    Comment: (NOTE) SARS-CoV-2 target nucleic acids are NOT DETECTED.  The SARS-CoV-2 RNA is generally detectable in upper respiratory specimens during the acute phase of infection. The lowest concentration of SARS-CoV-2 viral copies this assay can detect is 138  copies/mL. A negative result does not preclude SARS-Cov-2 infection and should not be used as the sole basis for treatment or other patient management decisions. A negative result may occur with  improper specimen collection/handling, submission of specimen other than nasopharyngeal swab, presence of viral mutation(s) within the areas targeted by this assay, and inadequate number of viral copies(<138 copies/mL). A negative result must be combined with clinical observations, patient history, and epidemiological information. The expected result is Negative.  Fact Sheet for Patients:  BloggerCourse.com  Fact Sheet for Healthcare Providers:  SeriousBroker.it  This test is no t yet approved or cleared by the [place] FDA and  has been authorized for detection and/or diagnosis of SARS-CoV-2 by FDA under an Emergency Use Authorization (EUA). This EUA will remain  in effect (meaning this test can be used) for the duration of the COVID-19 declaration under Section 564(b)(1) of the Act, 21 U.S.C.section 360bbb-3(b)(1), unless the authorization is terminated  or revoked sooner.       Influenza A by PCR NEGATIVE NEGATIVE Final   Influenza B by PCR NEGATIVE NEGATIVE Final    Comment: (NOTE) The Xpert Xpress SARS-CoV-2/FLU/RSV plus assay is  intended as an aid in the diagnosis of influenza from Nasopharyngeal swab specimens and should not be used as a sole basis for treatment. Nasal washings and aspirates are unacceptable for Xpert Xpress SARS-CoV-2/FLU/RSV testing.  Fact Sheet for Patients: BloggerCourse.com  Fact Sheet for Healthcare Providers: SeriousBroker.it  This test is not yet approved or cleared by the [place] FDA and has been authorized for detection and/or diagnosis of SARS-CoV-2 by FDA under an Emergency Use Authorization (EUA). This EUA will remain in effect (meaning this test can be used) for the duration of the COVID-19 declaration under Section 564(b)(1) of the Act, 21 U.S.C. section 360bbb-3(b)(1), unless the authorization is terminated or revoked.  Performed at [facility], [address] [place], [place] [address]   Culture, blood (single)     Status: None   Collection Time: 04/06/21  1:04 AM   Specimen: BLOOD RIGHT HAND  Result Value Ref Range Status   Specimen Description BLOOD RIGHT HAND  Final   Special Requests   Final    BOTTLES DRAWN AEROBIC AND ANAEROBIC Blood Culture adequate volume   Culture   Final    NO GROWTH 5 DAYS Performed at [facility], [address], [place], [place] [address]    Report Status 04/11/2021 FINAL  Final      Imaging Studies   No results found.   Medications   Scheduled Meds:  aspirin EC  81 mg Oral Daily   atorvastatin  80 mg Oral Daily   clopidogrel  75 mg Oral Daily   enoxaparin (LOVENOX) injection  40 mg Subcutaneous Q24H   insulin aspart  0-9 Units Subcutaneous TID WC   insulin detemir  30 Units Subcutaneous QHS   metoprolol tartrate  25 mg Oral BID   sertraline  50 mg Oral Daily   Continuous Infusions:     LOS: 7 days    Time spent: 20 minutes     Pennie Banter, DO [facility]  04/11/2021, 3:39 PM      If 7PM-7AM, please contact  night-coverage. How to contact the [facility] Attending or Consulting provider 7A - 7P or covering provider during after hours 7P -7A, for this patient?    Check the care team in [facility] and look for a) attending/consulting TRH provider listed and b)  the [facility] team listed Log into [email or web address] and use [facility]'s universal password to access. If you do not have the password, please contact the hospital operator. Locate the [facility] provider you are looking for under [facility] and page to a number that you can be directly reached. If you still have difficulty reaching the provider, please page the [facility] (Director on Call) for the Hospitalists listed on amion for assistance.

## 2021-04-11 NOTE — Progress Notes (Addendum)
  Progress Note    04/11/2021 7:22 AM * No surgery found *  Subjective:  Feels well. Just some residual fullness along left cheek and jawline   Vitals:   04/10/21 2338 04/11/21 0500  BP: 103/64 (!) 119/58  Pulse: 61 60  Resp: 19 13  Temp: (!) 97.4 F (36.3 C) (!) 97.5 F (36.4 C)  SpO2: 100% 96%    Physical Exam: General appearance: Awake, alert in no apparent distress Neurologic: Alert and oriented x4, speech clear, tongue midline, face symmetric, grip strength 5/5 bilaterally Cardiac: Heart rate and rhythm are regular Respirations: Nonlabored   CBC    Component Value Date/Time   WBC 10.6 (H) 04/11/2021 0123   RBC 4.17 04/11/2021 0123   HGB 10.7 (L) 04/11/2021 0123   HCT 34.5 (L) 04/11/2021 0123   PLT 240 04/11/2021 0123   MCV 82.7 04/11/2021 0123   MCH 25.7 (L) 04/11/2021 0123   MCHC 31.0 04/11/2021 0123   RDW 16.1 (H) 04/11/2021 0123   LYMPHSABS 1.9 04/05/2021 0011   MONOABS 0.9 04/05/2021 0011   EOSABS 0.2 04/05/2021 0011   BASOSABS 0.0 04/05/2021 0011    BMET    Component Value Date/Time   NA 136 04/11/2021 0123   K 4.2 04/11/2021 0123   CL 103 04/11/2021 0123   CO2 27 04/11/2021 0123   GLUCOSE 122 (H) 04/11/2021 0123   BUN 24 (H) 04/11/2021 0123   CREATININE 1.03 (H) 04/11/2021 0123   CALCIUM 8.5 (L) 04/11/2021 0123   GFRNONAA 56 (L) 04/11/2021 0123    No intake or output data in the 24 hours ending 04/11/21 0722  HOSPITAL MEDICATIONS Scheduled Meds:  aspirin EC  81 mg Oral Daily   atorvastatin  80 mg Oral Daily   clopidogrel  75 mg Oral Daily   enoxaparin (LOVENOX) injection  40 mg Subcutaneous Q24H   insulin aspart  0-9 Units Subcutaneous TID WC   insulin detemir  30 Units Subcutaneous QHS   metoprolol tartrate  25 mg Oral BID   sertraline  50 mg Oral Daily   Continuous Infusions: PRN Meds:.acetaminophen **OR** acetaminophen (TYLENOL) oral liquid 160 mg/5 mL **OR** acetaminophen, diphenhydrAMINE  Assessment and Plan: No additional  neurologic events overnight.  Plan right TCAR on Wednesday for moderate 60-79% right ICA stenosis that is felt to be symptomatic.  Continue aspirin, plavix, and statin. NPO after MN.  -DVT prophylaxis:  Lovenox Correll   Wendi Maya, PA-C [facility] [phone] 04/11/2021  7:22 AM   I have seen and evaluated the patient. I agree with the PA note as documented above.  Plan right TCAR tomorrow for a moderate 60 to 79% stenosis felt to be symptomatic.  Continue aspirin Plavix statin.  N.p.o. at midnight.  Risk benefits discussed including 1% risk of perioperative stroke.  Cephus Shelling, MD [facility] [place] Office: [phone]

## 2021-04-11 NOTE — Progress Notes (Signed)
Physical Therapy Treatment Patient Details Name: Erin Baker MRN: [id number] DOB: 12/05/1940 Today's Date: 04/11/2021   History of Present Illness Pt is a 80 y.o. female admitted 04/04/21 with dysarthria. CT angio (+) plaque around carotid bifurcation. MRI with Rt frontal infarct. PMH includes CAD s/p stenting, CVA x5, thoracic endovascular aneurysm s/p repair, subclavian artery repair, HTN, DMII, tobacco use.   PT Comments    Pt progressing with mobility. Today's session focused on therex and ambulation for improving strength and endurance. Pt remains limited by decreased activity tolerance and impaired balance strategies/postural reactions. Will continue to follow acutely to address established goals.    Recommendations for follow up therapy are one component of a multi-disciplinary discharge planning process, led by the attending physician.  Recommendations may be updated based on patient status, additional functional criteria and insurance authorization.  Follow Up Recommendations  Home health PT;Supervision - Intermittent (versus OPPT if would have support to get to an appt)     Equipment Recommendations  3in1 (PT)    Recommendations for Other Services       Precautions / Restrictions Precautions Precautions: Fall Restrictions Weight Bearing Restrictions: No     Mobility  Bed Mobility Overal bed mobility: Independent Bed Mobility: Supine to Sit                Transfers Overall transfer level: Needs assistance Equipment used: None Transfers: Sit to/from Stand Sit to Stand: Supervision;Modified independent (Device/Increase time)         General transfer comment: able to stand from EOB with initial supervision for safety/balance; additional stand from low toilet height and repeated sit<>stands from recliner, mod indep for increased time but pt able to stand without UE support  Ambulation/Gait Ambulation/Gait assistance: Min guard;Supervision Gait Distance  (Feet): 550 Feet Assistive device: None Gait Pattern/deviations: Step-through pattern;Decreased stride length Gait velocity: Decreased   General Gait Details: Slow, mostly steady gait without DME, intermittent min guard for balance as pt at times veering in one direction but typically able to self correct; pt notes "my endurance feels weak" but did not require rest breaks this session; further distance limited by fatigue   Stairs             Wheelchair Mobility    Modified Rankin (Stroke Patients Only)       Balance Overall balance assessment: Needs assistance Sitting-balance support: Feet supported Sitting balance-[name] Scale: Good Sitting balance - Comments: able to perform seated pericare without assist   Standing balance support: No upper extremity supported;Single extremity supported Standing balance-[name] Scale: Fair Standing balance comment: dynamic stability beyond walking on level surface improved with UE support                            Cognition Arousal/Alertness: Awake/alert Behavior During Therapy: WFL for tasks assessed/performed Overall Cognitive Status: Within Functional Limits for tasks assessed                                        Exercises Other Exercises Other Exercises: 10x repeated sit<>stand (pt able to perform final 5 reps without UE support)    General Comments        Pertinent Vitals/Pain Pain Assessment: No/denies pain Pain Intervention(s): Monitored during session    Home Living  Prior Function            PT Goals (current goals can now be found in the care plan section) Progress towards PT goals: Progressing toward goals    Frequency    Min 3X/week      PT Plan Current plan remains appropriate    Co-evaluation              AM-PAC PT "6 Clicks" Mobility   Outcome Measure  Help needed turning from your back to your side while in a flat bed without  using bedrails?: None Help needed moving from lying on your back to sitting on the side of a flat bed without using bedrails?: None Help needed moving to and from a bed to a chair (including a wheelchair)?: None Help needed standing up from a chair using your arms (e.g., wheelchair or bedside chair)?: None Help needed to walk in hospital room?: A Little Help needed climbing 3-5 steps with a railing? : A Little 6 Click Score: 22    End of Session Equipment Utilized During Treatment: Gait belt Activity Tolerance: Patient tolerated treatment well Patient left: in chair;with call bell/phone within reach;with chair alarm set Nurse Communication: Mobility status PT Visit Diagnosis: Other abnormalities of gait and mobility (R26.89);Difficulty in walking, not elsewhere classified (R26.2)     Time: 6381-7711 PT Time Calculation (min) (ACUTE ONLY): 21 min  Charges:  $Therapeutic Exercise: 8-22 mins                     Ina Homes, PT, DPT Acute Rehabilitation Services  Pager [phone] Office [phone]  Malachy Chamber 04/11/2021, 1:26 PM

## 2021-04-12 ENCOUNTER — Encounter (HOSPITAL_COMMUNITY)
Admission: EM | Disposition: A | Discharge status: Home Health | Payer: Self-pay | Source: Home / Self Care | Attending: Internal Medicine

## 2021-04-12 ENCOUNTER — Inpatient Hospital Stay (HOSPITAL_COMMUNITY): Payer: Medicare Other | Admitting: Anesthesiology

## 2021-04-12 ENCOUNTER — Inpatient Hospital Stay (HOSPITAL_COMMUNITY): Payer: Medicare Other

## 2021-04-12 ENCOUNTER — Inpatient Hospital Stay (HOSPITAL_COMMUNITY): Admission: RE | Admit: 2021-04-12 | Payer: Medicare Other | Source: Home / Self Care | Admitting: Vascular Surgery

## 2021-04-12 ENCOUNTER — Encounter (HOSPITAL_COMMUNITY): Payer: Self-pay | Admitting: Internal Medicine

## 2021-04-12 DIAGNOSIS — I6340 Cerebral infarction due to embolism of unspecified cerebral artery: Secondary | ICD-10-CM | POA: Diagnosis not present

## 2021-04-12 DIAGNOSIS — Z20822 Contact with and (suspected) exposure to covid-19: Secondary | ICD-10-CM | POA: Diagnosis not present

## 2021-04-12 DIAGNOSIS — R471 Dysarthria and anarthria: Secondary | ICD-10-CM | POA: Diagnosis not present

## 2021-04-12 DIAGNOSIS — I6521 Occlusion and stenosis of right carotid artery: Secondary | ICD-10-CM | POA: Diagnosis not present

## 2021-04-12 DIAGNOSIS — Z006 Encounter for examination for normal comparison and control in clinical research program: Secondary | ICD-10-CM

## 2021-04-12 DIAGNOSIS — I63231 Cerebral infarction due to unspecified occlusion or stenosis of right carotid arteries: Secondary | ICD-10-CM

## 2021-04-12 DIAGNOSIS — I63411 Cerebral infarction due to embolism of right middle cerebral artery: Secondary | ICD-10-CM | POA: Diagnosis not present

## 2021-04-12 HISTORY — PX: ULTRASOUND GUIDANCE FOR VASCULAR ACCESS: SHX6516

## 2021-04-12 HISTORY — PX: TRANSCAROTID ARTERY REVASCULARIZATIONÂ: SHX6778

## 2021-04-12 LAB — CBC
HCT: 34.5 % — ABNORMAL LOW (ref 36.0–46.0)
Hemoglobin: 10.8 g/dL — ABNORMAL LOW (ref 12.0–15.0)
MCH: 25.7 pg — ABNORMAL LOW (ref 26.0–34.0)
MCHC: 31.3 g/dL (ref 30.0–36.0)
MCV: 82.1 fL (ref 80.0–100.0)
Platelets: 224 10*3/uL (ref 150–400)
RBC: 4.2 MIL/uL (ref 3.87–5.11)
RDW: 16.3 % — ABNORMAL HIGH (ref 11.5–15.5)
WBC: 9.6 10*3/uL (ref 4.0–10.5)
nRBC: 0 % (ref 0.0–0.2)

## 2021-04-12 LAB — BASIC METABOLIC PANEL
Anion gap: 6 (ref 5–15)
BUN: 23 mg/dL (ref 8–23)
CO2: 29 mmol/L (ref 22–32)
Calcium: 8.8 mg/dL — ABNORMAL LOW (ref 8.9–10.3)
Chloride: 100 mmol/L (ref 98–111)
Creatinine, Ser: 1.12 mg/dL — ABNORMAL HIGH (ref 0.44–1.00)
GFR, Estimated: 50 mL/min — ABNORMAL LOW (ref >60)
Glucose, Bld: 130 mg/dL — ABNORMAL HIGH (ref 70–99)
Potassium: 4.6 mmol/L (ref 3.5–5.1)
Sodium: 135 mmol/L (ref 135–145)

## 2021-04-12 LAB — SURGICAL PCR SCREEN
MRSA, PCR: NEGATIVE
Staphylococcus aureus: NEGATIVE

## 2021-04-12 LAB — BLOOD GAS, ARTERIAL
Acid-Base Excess: 0.5 mmol/L (ref 0.0–2.0)
Bicarbonate: 24.9 mmol/L (ref 20.0–28.0)
FIO2: 21
O2 Saturation: 94.8 %
Patient temperature: 37
pCO2 arterial: 42 mmHg (ref 32.0–48.0)
pH, Arterial: 7.39 (ref 7.350–7.450)
pO2, Arterial: 75.3 mmHg — ABNORMAL LOW (ref 83.0–108.0)

## 2021-04-12 LAB — GLUCOSE, CAPILLARY
Glucose-Capillary: 134 mg/dL — ABNORMAL HIGH (ref 70–99)
Glucose-Capillary: 86 mg/dL (ref 70–99)
Glucose-Capillary: 71 mg/dL (ref 70–99)
Glucose-Capillary: 80 mg/dL (ref 70–99)
Glucose-Capillary: 90 mg/dL (ref 70–99)
Glucose-Capillary: 357 mg/dL — ABNORMAL HIGH (ref 70–99)

## 2021-04-12 LAB — PROTIME-INR
INR: 1.1 (ref 0.8–1.2)
Prothrombin Time: 14.5 seconds (ref 11.4–15.2)

## 2021-04-12 LAB — ABO/RH: ABO/RH(D): O POS

## 2021-04-12 LAB — TYPE AND SCREEN
ABO/RH(D): O POS
Antibody Screen: NEGATIVE

## 2021-04-12 LAB — MAGNESIUM: Magnesium: 1.9 mg/dL (ref 1.7–2.4)

## 2021-04-12 LAB — APTT: aPTT: 36 seconds (ref 24–36)

## 2021-04-12 SURGERY — TRANSCAROTID ARTERY REVASCULARIZATION (TCAR)
Anesthesia: General | Site: Neck | Laterality: Right

## 2021-04-12 MED ORDER — PROTAMINE SULFATE 10 MG/ML IV SOLN
INTRAVENOUS | Status: DC | PRN
  Administered 2021-04-12: 40 mg via INTRAVENOUS

## 2021-04-12 MED ORDER — HEPARIN 6000 UNIT IRRIGATION SOLUTION
Status: AC
  Filled 2021-04-12: qty 500

## 2021-04-12 MED ORDER — GLYCOPYRROLATE PF 0.2 MG/ML IJ SOSY
PREFILLED_SYRINGE | INTRAMUSCULAR | Status: DC | PRN
  Administered 2021-04-12 (×3): .2 mg via INTRAVENOUS

## 2021-04-12 MED ORDER — PHENYLEPHRINE HCL-NACL 20-0.9 MG/250ML-% IV SOLN
INTRAVENOUS | Status: DC | PRN
  Administered 2021-04-12: 30 ug/min via INTRAVENOUS
  Administered 2021-04-12: 20 ug/min via INTRAVENOUS

## 2021-04-12 MED ORDER — ONDANSETRON HCL 4 MG/2ML IJ SOLN
4.0000 mg | Freq: Four times a day (QID) | INTRAMUSCULAR | Status: DC | PRN
  Administered 2021-04-12: 4 mg via INTRAVENOUS
  Filled 2021-04-12: qty 2

## 2021-04-12 MED ORDER — HYDROMORPHONE HCL 1 MG/ML IJ SOLN
0.5000 mg | INTRAMUSCULAR | Status: DC | PRN
Start: 2021-04-12 — End: 2021-04-12

## 2021-04-12 MED ORDER — SUGAMMADEX SODIUM 200 MG/2ML IV SOLN
INTRAVENOUS | Status: DC | PRN
  Administered 2021-04-12: 200 mg via INTRAVENOUS

## 2021-04-12 MED ORDER — CHLORHEXIDINE GLUCONATE 0.12 % MT SOLN: 15.0000 mL | Freq: Once | OROMUCOSAL | Status: AC

## 2021-04-12 MED ORDER — CHLORHEXIDINE GLUCONATE CLOTH 2 % EX PADS
6.0000 | MEDICATED_PAD | Freq: Once | CUTANEOUS | Status: AC
  Administered 2021-04-12: 6 via TOPICAL

## 2021-04-12 MED ORDER — HYDRALAZINE HCL 20 MG/ML IJ SOLN: 5.0000 mg | INTRAMUSCULAR | Status: DC | PRN

## 2021-04-12 MED ORDER — CEFAZOLIN SODIUM-DEXTROSE 2-4 GM/100ML-% IV SOLN
2.0000 g | INTRAVENOUS | Status: AC
  Administered 2021-04-12: 2 g via INTRAVENOUS
  Filled 2021-04-12 (×2): qty 100

## 2021-04-12 MED ORDER — DOCUSATE SODIUM 100 MG PO CAPS
100.0000 mg | ORAL_CAPSULE | Freq: Every day | ORAL | Status: DC
  Administered 2021-04-13: 100 mg via ORAL
  Filled 2021-04-12: qty 1

## 2021-04-12 MED ORDER — HEMOSTATIC AGENTS (NO CHARGE) OPTIME
TOPICAL | Status: DC | PRN
  Administered 2021-04-12: 1 via TOPICAL

## 2021-04-12 MED ORDER — FENTANYL CITRATE (PF) 100 MCG/2ML IJ SOLN
INTRAMUSCULAR | Status: DC | PRN
  Administered 2021-04-12: 50 ug via INTRAVENOUS

## 2021-04-12 MED ORDER — DEXAMETHASONE SODIUM PHOSPHATE 10 MG/ML IJ SOLN
INTRAMUSCULAR | Status: DC | PRN
  Administered 2021-04-12: 5 mg via INTRAVENOUS

## 2021-04-12 MED ORDER — SODIUM CHLORIDE 0.9 % IV SOLN: 500.0000 mL | Freq: Once | INTRAVENOUS | Status: DC | PRN

## 2021-04-12 MED ORDER — MAGNESIUM SULFATE 2 GM/50ML IV SOLN: 2.0000 g | Freq: Every day | INTRAVENOUS | Status: DC | PRN

## 2021-04-12 MED ORDER — LIDOCAINE HCL (PF) 1 % IJ SOLN
INTRAMUSCULAR | Status: AC
  Filled 2021-04-12: qty 5

## 2021-04-12 MED ORDER — HYDROCODONE-ACETAMINOPHEN 5-325 MG PO TABS: 1.0000 | ORAL_TABLET | Freq: Four times a day (QID) | ORAL | Status: DC | PRN

## 2021-04-12 MED ORDER — ROCURONIUM BROMIDE 10 MG/ML (PF) SYRINGE
PREFILLED_SYRINGE | INTRAVENOUS | Status: DC | PRN
Start: 2021-04-12 — End: 2021-04-12
  Administered 2021-04-12: 50 mg via INTRAVENOUS

## 2021-04-12 MED ORDER — PROPOFOL 10 MG/ML IV BOLUS
INTRAVENOUS | Status: DC | PRN
  Administered 2021-04-12: 80 mg via INTRAVENOUS

## 2021-04-12 MED ORDER — FENTANYL CITRATE (PF) 100 MCG/2ML IJ SOLN: 25.0000 ug | INTRAMUSCULAR | Status: DC | PRN

## 2021-04-12 MED ORDER — HEPARIN SODIUM (PORCINE) 1000 UNIT/ML IJ SOLN
INTRAMUSCULAR | Status: DC | PRN
  Administered 2021-04-12: 8000 [IU] via INTRAVENOUS

## 2021-04-12 MED ORDER — HEPARIN 6000 UNIT IRRIGATION SOLUTION
Status: DC | PRN
  Administered 2021-04-12: 1

## 2021-04-12 MED ORDER — GUAIFENESIN-DM 100-10 MG/5ML PO SYRP: 15.0000 mL | ORAL_SOLUTION | ORAL | Status: DC | PRN

## 2021-04-12 MED ORDER — 0.9 % SODIUM CHLORIDE (POUR BTL) OPTIME
TOPICAL | Status: DC | PRN
  Administered 2021-04-12: 1000 mL

## 2021-04-12 MED ORDER — METOPROLOL TARTRATE 5 MG/5ML IV SOLN: 2.0000 mg | INTRAVENOUS | Status: DC | PRN

## 2021-04-12 MED ORDER — PHENOL 1.4 % MT LIQD: 1.0000 | OROMUCOSAL | Status: DC | PRN

## 2021-04-12 MED ORDER — ONDANSETRON HCL 4 MG/2ML IJ SOLN
INTRAMUSCULAR | Status: DC | PRN
  Administered 2021-04-12: 4 mg via INTRAVENOUS

## 2021-04-12 MED ORDER — LEVOTHYROXINE SODIUM 100 MCG PO TABS
100.0000 ug | ORAL_TABLET | Freq: Every day | ORAL | Status: DC
  Administered 2021-04-13: 100 ug via ORAL
  Filled 2021-04-12: qty 1

## 2021-04-12 MED ORDER — SODIUM CHLORIDE 0.9 % IV SOLN: INTRAVENOUS | Status: DC

## 2021-04-12 MED ORDER — CHLORHEXIDINE GLUCONATE 0.12 % MT SOLN
OROMUCOSAL | Status: AC
  Administered 2021-04-12: 15 mL via OROMUCOSAL
  Filled 2021-04-12: qty 15

## 2021-04-12 MED ORDER — LACTATED RINGERS IV SOLN: INTRAVENOUS | Status: DC

## 2021-04-12 MED ORDER — MIDAZOLAM HCL 2 MG/2ML IJ SOLN
INTRAMUSCULAR | Status: DC | PRN
  Administered 2021-04-12: 1 mg via INTRAVENOUS

## 2021-04-12 MED ORDER — ACETAMINOPHEN 325 MG PO TABS
ORAL_TABLET | ORAL | Status: AC
  Filled 2021-04-12: qty 2

## 2021-04-12 MED ORDER — LABETALOL HCL 5 MG/ML IV SOLN: 10.0000 mg | INTRAVENOUS | Status: DC | PRN

## 2021-04-12 MED ORDER — ONDANSETRON HCL 4 MG/2ML IJ SOLN: 4.0000 mg | Freq: Once | INTRAMUSCULAR | Status: DC | PRN

## 2021-04-12 MED ORDER — ORAL CARE MOUTH RINSE: 15.0000 mL | Freq: Once | OROMUCOSAL | Status: AC

## 2021-04-12 MED ORDER — POTASSIUM CHLORIDE CRYS ER 20 MEQ PO TBCR: 20.0000 meq | EXTENDED_RELEASE_TABLET | Freq: Every day | ORAL | Status: DC | PRN

## 2021-04-12 MED ORDER — CEFAZOLIN SODIUM-DEXTROSE 2-4 GM/100ML-% IV SOLN
2.0000 g | Freq: Three times a day (TID) | INTRAVENOUS | Status: AC
  Administered 2021-04-12 – 2021-04-13 (×2): 2 g via INTRAVENOUS
  Filled 2021-04-12 (×2): qty 100

## 2021-04-12 MED ORDER — LIDOCAINE 2% (20 MG/ML) 5 ML SYRINGE
INTRAMUSCULAR | Status: DC | PRN
  Administered 2021-04-12: 50 mg via INTRAVENOUS

## 2021-04-12 MED ORDER — EPHEDRINE SULFATE-NACL 50-0.9 MG/10ML-% IV SOSY
PREFILLED_SYRINGE | INTRAVENOUS | Status: DC | PRN
  Administered 2021-04-12: 5 mg via INTRAVENOUS
  Administered 2021-04-12: 10 mg via INTRAVENOUS

## 2021-04-12 MED ORDER — IODIXANOL 320 MG/ML IV SOLN
INTRAVENOUS | Status: DC | PRN
  Administered 2021-04-12: 31 mL

## 2021-04-12 SURGICAL SUPPLY — 58 items
BAG BANDED W/RUBBER/TAPE 36X54 (MISCELLANEOUS) ×3 IMPLANT
BAG COUNTER SPONGE SURGICOUNT (BAG) ×3 IMPLANT
BALLN STERLING RX 5X30X80 (BALLOONS) ×3
BALLOON STERLING RX 5X30X80 (BALLOONS) ×2 IMPLANT
CANISTER SUCT 3000ML PPV (MISCELLANEOUS) ×3 IMPLANT
CATH ROBINSON RED A/P 18FR (CATHETERS) IMPLANT
CLIP VESOCCLUDE MED 6/CT (CLIP) ×3 IMPLANT
CLIP VESOCCLUDE SM WIDE 6/CT (CLIP) ×3 IMPLANT
COVER DOME SNAP 22 D (MISCELLANEOUS) ×3 IMPLANT
COVER PROBE W GEL 5X96 (DRAPES) ×3 IMPLANT
DERMABOND ADHESIVE PROPEN (GAUZE/BANDAGES/DRESSINGS) ×1
DERMABOND ADVANCED (GAUZE/BANDAGES/DRESSINGS) ×1
DERMABOND ADVANCED .7 DNX12 (GAUZE/BANDAGES/DRESSINGS) ×2 IMPLANT
DERMABOND ADVANCED .7 DNX6 (GAUZE/BANDAGES/DRESSINGS) ×2 IMPLANT
DRAPE FEMORAL ANGIO 80X135IN (DRAPES) ×3 IMPLANT
ELECT REM PT RETURN 9FT ADLT (ELECTROSURGICAL) ×3
ELECTRODE REM PT RTRN 9FT ADLT (ELECTROSURGICAL) ×2 IMPLANT
GLOVE SURG ENC MOIS LTX SZ7.5 (GLOVE) ×3 IMPLANT
GLOVE SURG UNDER POLY LF SZ6.5 (GLOVE) ×3 IMPLANT
GOWN STRL REUS W/ TWL LRG LVL3 (GOWN DISPOSABLE) ×4 IMPLANT
GOWN STRL REUS W/ TWL XL LVL3 (GOWN DISPOSABLE) ×2 IMPLANT
GOWN STRL REUS W/TWL LRG LVL3 (GOWN DISPOSABLE) ×2
GOWN STRL REUS W/TWL XL LVL3 (GOWN DISPOSABLE) ×1
HEMOSTAT SNOW SURGICEL 2X4 (HEMOSTASIS) ×3 IMPLANT
INTRODUCER KIT GALT 7CM (INTRODUCER) ×2
KIT BASIN OR (CUSTOM PROCEDURE TRAY) ×3 IMPLANT
KIT ENCORE 26 ADVANTAGE (KITS) ×3 IMPLANT
KIT INTRODUCER GALT 7 (INTRODUCER) ×4 IMPLANT
KIT TURNOVER KIT B (KITS) ×3 IMPLANT
NEEDLE HYPO 25GX1X1/2 BEV (NEEDLE) IMPLANT
PACK CAROTID (CUSTOM PROCEDURE TRAY) ×3 IMPLANT
PENCIL BUTTON HOLSTER BLD 10FT (ELECTRODE) ×3 IMPLANT
POSITIONER HEAD DONUT 9IN (MISCELLANEOUS) ×3 IMPLANT
PROTECTION STATION PRESSURIZED (MISCELLANEOUS)
SET MICROPUNCTURE 5F STIFF (MISCELLANEOUS) ×3 IMPLANT
SHEATH PINNACLE 5FR (SHEATH) IMPLANT
SHUNT CAROTID BYPASS 10 (VASCULAR PRODUCTS) IMPLANT
SHUNT CAROTID BYPASS 12FRX15.5 (VASCULAR PRODUCTS) IMPLANT
STATION PROTECTION PRESSURIZED (MISCELLANEOUS) IMPLANT
STENT TRANSCAROTID SYSTEM 9X40 (Permanent Stent) ×3 IMPLANT
SUT MNCRL AB 4-0 PS2 18 (SUTURE) ×3 IMPLANT
SUT PROLENE 5 0 C 1 24 (SUTURE) ×3 IMPLANT
SUT PROLENE 6 0 BV (SUTURE) ×3 IMPLANT
SUT PROLENE 7 0 BV 1 (SUTURE) IMPLANT
SUT SILK 2 0 PERMA HAND 18 BK (SUTURE) IMPLANT
SUT SILK 2 0 SH CR/8 (SUTURE) IMPLANT
SUT SILK 3 0 (SUTURE)
SUT SILK 3-0 18XBRD TIE 12 (SUTURE) IMPLANT
SUT VIC AB 3-0 SH 27 (SUTURE) ×1
SUT VIC AB 3-0 SH 27X BRD (SUTURE) ×2 IMPLANT
SYR 10ML LL (SYRINGE) ×9 IMPLANT
SYR 20ML LL LF (SYRINGE) ×3 IMPLANT
SYR CONTROL 10ML LL (SYRINGE) IMPLANT
SYSTEM TRANSCAROTID NEUROPRTCT (MISCELLANEOUS) ×2 IMPLANT
TOWEL GREEN STERILE (TOWEL DISPOSABLE) ×3 IMPLANT
TRANSCAROTID NEUROPROTECT SYS (MISCELLANEOUS) ×3
WATER STERILE IRR 1000ML POUR (IV SOLUTION) ×3 IMPLANT
WIRE BENTSON .035X145CM (WIRE) ×3 IMPLANT

## 2021-04-12 NOTE — TOC Progression Note (Signed)
Transition of Care [facility]) - Progression Note    Patient Details  Name: Minnette Merida MRN: [id number] Date of Birth: 09/23/1940  Transition [facility]) CM/SW Contact  Lawerance Sabal, RN Phone Number: 04/12/2021, 2:02 PM  Clinical Narrative:     Patient in procedure.  Spoke w patient's daughter [name] over the phone. She states that her mom has a policy that can assist with an aid at home for the first 60 days. She has a form to be signed by the MD that she will bring to the hospital tomorrow. Discussed HH agencies and ratings. CM will leave list from medicare.gov in patient's room for patient to choose.  Needs HH order w face to face and referral, and insurance form signed for aid, paient may also have DME needs.     Expected Discharge Plan: IP Rehab Facility Barriers to Discharge: Unsafe home situation  Expected Discharge Plan and Services Expected Discharge Plan: IP Rehab Facility In-house Referral: Clinical Social Work Discharge Planning Services: CM Consult Post Acute Care Choice: IP Rehab, Skilled Nursing Facility Living arrangements for the past 2 months: Single Family Home                                       Social Determinants of Health (SDOH) Interventions    Readmission Risk Interventions No flowsheet data found.

## 2021-04-12 NOTE — Op Note (Signed)
Date: April 12, 2021  Preoperative diagnosis: Symptomatic moderate 60 to 79% right ICA stenosis  Postoperative diagnosis: Same  Procedure: 1.  Ultrasound-guided access of left common femoral vein for delivery of TCAR venous sheath 2.  Transcatheter placement of intravascular stent of the right cervical carotid artery after cutdown with percutaneous access including angioplasty (right TCAR)  Surgeon: Dr. Cephus Shelling, MD  Assistant: Dr. Heath Lark, MD  Indications: Patient is a 80 year old female that was seen last week as a consult for moderate right ICA stenosis felt to be symptomatic with a small right brain stroke.  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  Ultimately we discussed that her stroke could be multifactorial but we agreed the moderate ICA stenosis would likely be the first thing to treat.  She presents after risk benefits discussed.  Findings: Ultrasound-guided access left common femoral vein with delivery of TCAR flow reversal venous sheath.  Then did a cutdown on the right common carotid just above the right clavicle with placement of the TCAR arterial sheath.  The lesion was then crossed and angioplastied with a 5 mm x 30 mm balloon after active flow reversal was initiated and then stented with a 9 mm x 40 mm Enroute stent.  No residual stenosis and widely patent stent at completion.  Total flow reversal time was 5 minutes.  Anesthesia: General  Details: Patient was taken to the operating room after informed consent was obtained.  Placed on the operative table in supine position.  General endotracheal anesthesia was induced.  The right neck and both groins were then prepped and draped in usual sterile fashion.  Antibiotics were given.  Timeout was performed.  Initially evaluated the left common femoral vein with ultrasound it was patent and image was  saved.  This was accessed under ultrasound guidance with micro access needle placed a microwire and a micro sheath.  Then placed a Bentson wire and the TCAR venous flow reversal sheath in the left common femoral vein Then turned our attention to the right neck.  Transverse incision was made 1 fingerbreadth above the right clavicle.  Platysma was opened with Bovie cautery.  We did a muscle-sparing technique between the heads of the sternocleidomastoid.  Dissected out the common carotid after the internal jugular vein and vagus nerve were identified and preserved.  I then got a vessel loop and an umbilical tape around the common carotid artery at the base of the wound.  Pursestring was placed with a 5-0 Prolene U stitch.  Patient was given 100 units/kg IV heparin.  The artery was then accessed with a micro access needle placed a microwire and micro sheath.  We then got a carotid angiogram to identify the bifurcation.  This was a fairly short runway and initially thought we could engage the external carotid artery.  Unfortunately this came off at a steep angulation and we did not have enough angle to engage this on our wire and I thought repeated attempts would be likely be high risk so elected to stop short and placed the J-wire just below the carotid bifurcation and got the arterial sheath in place in the common carotid.  This was secured with 2-0 silk ties.  We then connected the filter from the arterial sheath in the neck to the venous sheath in the groin.  We were on passive flow reversal with good flow.  We  then got another carotid angiogram identifying the lesion in the proximal ICA.  We went on active clamp on the common carotid and tested that her flow reversal was good.  We then crossed the lesion with a wire and ballooned this with balloon angioplasty with a 5 mm x 30 mm balloon.  We then primarily stented this with a 9 mm x 40 mm Enroute stent in the proximal ICA distal CCA.  We allowed additional 2  minutes of flow reversal.  Final injection showed widely patent stent with no residual stenosis and good filling of the ICA distal.  At that point in time the filter was disconnected.  We removed the sheath in the artery of the common carotid and tied down the pursestring 5-0 Prolene.  The sheath in the groin was removed and manual pressure was held.  We listened with pencil Doppler and the common carotid had good flow and gave 40 mg of protamine.  The incision in the neck was washed out closed with 3-0 Vicryl 4-0 Monocryl and Dermabond.  Taken to PACU in stable condition with no neurologic deficits.  Complication: None  Condition: Stable  Cephus Shelling, MD [facility] [place] Office: [phone]   Cephus Shelling

## 2021-04-12 NOTE — Therapy (Signed)
Occupational Therapy Treatment Patient Details Name: Rocio Roam MRN: [id number] DOB: February 08, 1941 Today's Date: 04/12/2021   History of present illness Pt is a 80 y.o. female admitted 04/04/21 with dysarthria. CT angio (+) plaque around carotid bifurcation. MRI with Rt frontal infarct. PMH includes CAD s/p stenting, CVA x5, thoracic endovascular aneurysm s/p repair, subclavian artery repair, HTN, DMII, tobacco use.   OT comments  Pt seen for OT ADL retraining session today w/ focus on functional mobility/transfers, grooming standing at sink and toileting/transfers in room.  Pt ambulates with wide base of stance, ocassionally reaching for objects to steady herself. Decreased awareness of deficits. Conversation on recommendation of RW use for increased stability, pt daughter states "She won't use one". Pt is NPO & c/o feeling n/v and RN made aware, pt had medications about 20 min prior to OT. Pt was given cool washcloths for the back of her neck and forehead and RN requested anti-nausea medication as pt is scheduled for a procedure later today.   Recommendations for follow up therapy are one component of a multi-disciplinary discharge planning process, led by the attending physician.  Recommendations may be updated based on patient status, additional functional criteria and insurance authorization.    Follow Up Recommendations  Supervision/Assistance - 24 hour (Will require supervision/Assist)    Equipment Recommendations  Other (comment) (TBD)    Recommendations for Other Services      Precautions / Restrictions Precautions Precautions: Fall Precaution Comments: Decreased awareness/insight; emotionally labile Restrictions Weight Bearing Restrictions: No       Mobility Bed Mobility Overal bed mobility: Independent Bed Mobility: Supine to Sit     Supine to sit: Modified independent (Device/Increase time);HOB elevated          Transfers Overall transfer level: Needs  assistance Equipment used: None Transfers: Sit to/from [employer] Sit to Stand: Supervision Stand pivot transfers: Supervision;Min guard       General transfer comment: able to stand from EOB with initial supervision for safety/balance; additional stand from low toilet height w/ supervision/Min guard and repeated sit<>stands from recliner at mod indep for increased time but pt able to stand without UE support    Balance Overall balance assessment: Needs assistance Sitting-balance support: Feet supported Sitting balance-[name] Scale: Good     Standing balance support: No upper extremity supported;Single extremity supported Standing balance-[name] Scale: Fair     ADL either performed or assessed with clinical judgement   ADL Overall ADL's : Needs assistance/impaired Eating/Feeding: NPO (scheduled for surgical procedure later today)   Grooming: Wash/dry hands;Wash/dry face;Supervision/safety;Standing Grooming Details (indicate cue type and reason): Standing at sink for grooming s/p toileting Upper Body Bathing:  ("I already washed up" declined)     Toilet Transfer: Supervision/safety;Min Catering manager (Recommended RW for safety and pt declined, daughter present in room stating "She won't use one, She has had a 4 prong cane, rollator and RW and she doesn"t use them" Pt daughter also states "She has fired all of her HH aides, PT's and OT's" in the past.)   Toileting- Architect and Hygiene: Supervision/safety;Min guard;Sitting/lateral lean;Sit to/from Statistician Details (indicate cue type and reason): 3/3 parts of toileting task with supervision/min guard A for safety     Functional mobility during ADLs: Supervision/safety;Min guard General ADL Comments: Pt ambulates with wide base of stance, ocassionally reaching for objects to steady herself. Decreased awareness of deficits. Extensive conversation on recommendation of RW  or AD for functional mobility/transfers and pt daughter states "She won't  use one, She has had a 4 prong cane, rollator and RW and she doesn't use them" Pt daughter also states "She has fired all of her HH aides, PT's and OT's" in the past. Pt c/o feeling n/v and RN made aware, pt had medications about 20 min prior to OT. Pt was given cool washcloths for the back of her neck and forehead and RN requested anti-nausea medication as pt is scheduled for a procedure later today.     Cognition Arousal/Alertness: Awake/alert Behavior During Therapy: WFL for tasks assessed/performed Overall Cognitive Status: Within Functional Limits for tasks assessed                Pertinent Vitals/ Pain       Pain Assessment: No/denies pain Faces Pain Scale: No hurt   Frequency  Min 2X/week     Progress Toward Goals  OT Goals(current goals can now be found in the care plan section)  Progress towards OT goals: Progressing toward goals  Acute Rehab OT Goals Patient Stated Goal: To go home OT Goal Formulation: With patient Time For Goal Achievement: 04/19/21 Potential to Achieve Goals: Good  Plan Discharge plan remains appropriate;Frequency remains appropriate       AM-PAC OT "6 Clicks" Daily Activity     Outcome Measure   Help from another person eating meals?: None (NPO this date secondary to pending procedure) Help from another person taking care of personal grooming?: A Little Help from another person toileting, which includes using toliet, bedpan, or urinal?: A Little Help from another person bathing (including washing, rinsing, drying)?: A Little Help from another person to put on and taking off regular upper body clothing?: None Help from another person to put on and taking off regular lower body clothing?: A Little 6 Click Score: 20    End of Session    OT Visit Diagnosis: Other symptoms and signs involving cognitive function   Activity Tolerance Other (comment) (Treatment limited  secondary to n/v. RN made aware)   Patient Left in chair;with call bell/phone within reach   Nurse Communication Mobility status;Other (comment) (n/v and RN requested anti nausea medications for pt.)        Time: 0814-4818 OT Time Calculation (min): 21 min  Charges: OT General Charges $OT Visit: 1 Visit OT Treatments $Self Care/Home Management : 8-22 mins  Richie Bonanno Beth Dixon, OTR/L 04/12/2021, 10:10 AM

## 2021-04-12 NOTE — Progress Notes (Signed)
[facility] [place]  Subjective  -no acute events overnight   Objective (!) 126/58 61 98.5 F (36.9 C) (Oral) 20 (!) 68% No intake or output data in the 24 hours ending 04/12/21 1227  Grossly neurologically intact  Laboratory Lab Results: Recent Labs    04/11/21 0123 04/12/21 0440  WBC 10.6* 9.6  HGB 10.7* 10.8*  HCT 34.5* 34.5*  PLT 240 224   BMET Recent Labs    04/11/21 0123 04/12/21 0440  NA 136 135  K 4.2 4.6  CL 103 100  CO2 27 29  GLUCOSE 122* 130*  BUN 24* 23  CREATININE 1.03* 1.12*  CALCIUM 8.5* 8.8*    COAG Lab Results  Component Value Date   INR 1.1 04/12/2021   INR 1.1 04/04/2021   No results found for: PTT  Assessment/Planning:  Plan right TCAR for a moderate 60 to 79% stenosis felt to be symptomatic in the setting of small right brain stroke.  I discussed steps of the procedure and indications with the patient.  She got aspirin Plavix statin today.  I discussed risks of surgery including 1% perioperative stroke risk.  Cephus Shelling 04/12/2021 12:27 PM --

## 2021-04-12 NOTE — Transfer of Care (Signed)
Immediate Anesthesia Transfer of Care Note  Patient: Krista Godsil  Procedure(s) Performed: RIGHT TRANSCAROTID ARTERY REVASCULARIZATION (Right: Neck) ULTRASOUND GUIDANCE FOR VASCULAR ACCESS, LEFT FEMORAL VEIN (Left: Groin)  Patient Location: PACU  Anesthesia Type:General  Level of Consciousness: drowsy and patient cooperative  Airway & Oxygen Therapy: Patient Spontanous Breathing and Patient connected to nasal cannula oxygen  Post-op Assessment: Report given to RN, Post -op Vital signs reviewed and stable and Patient moving all extremities  Post vital signs: Reviewed and stable  Last Vitals:  Vitals Value Taken Time  BP    Temp    Pulse 69 04/12/21 1436  Resp 19 04/12/21 1436  SpO2 98 % 04/12/21 1436  Vitals shown include unvalidated device data.  Last Pain:  Vitals:   04/12/21 1154  TempSrc:   PainSc: 0-No pain         Complications: No notable events documented.

## 2021-04-12 NOTE — Anesthesia Procedure Notes (Signed)
Procedure Name: Intubation Date/Time: 04/12/2021 1:06 PM Performed by: Moshe Salisbury, CRNA Pre-anesthesia Checklist: Patient identified, Emergency Drugs available, Suction available and Patient being monitored Patient Re-evaluated:Patient Re-evaluated prior to induction Oxygen Delivery Method: Circle System Utilized Preoxygenation: Pre-oxygenation with 100% oxygen Induction Type: IV induction Ventilation: Mask ventilation without difficulty Laryngoscope Size: Mac and 3 Grade View: Grade I Tube type: Oral Tube size: 7.5 mm Number of attempts: 1 Airway Equipment and Method: Stylet Placement Confirmation: ETT inserted through vocal cords under direct vision, positive ETCO2 and breath sounds checked- equal and bilateral Secured at: 21 cm Tube secured with: Tape Dental Injury: Teeth and Oropharynx as per pre-operative assessment

## 2021-04-12 NOTE — Discharge Instructions (Signed)
   [facility]  Discharge Instructions   Carotid Surgery  Please refer to the following instructions for your post-procedure care. Your surgeon or physician assistant will discuss any changes with you.  Activity  You are encouraged to walk as much as you can. You can slowly return to normal activities but must avoid strenuous activity and heavy lifting until your doctor tell you it's okay. Avoid activities such as vacuuming or swinging a golf club. You can drive after one week if you are comfortable and you are no longer taking prescription pain medications. It is normal to feel tired for serval weeks after your surgery. It is also normal to have difficulty with sleep habits, eating, and bowel movements after surgery. These will go away with time.  Bathing/Showering  Shower daily after you go home. Do not soak in a bathtub, hot tub, or swim until the incision heals completely.  Incision Care  Shower every day. Clean your incision with mild soap and water. Pat the area dry with a clean towel. You do not need a bandage unless otherwise instructed. Do not apply any ointments or creams to your incision. You may have skin glue on your incision. Do not peel it off. It will come off on its own in about one week. Your incision may feel thickened and raised for several weeks after your surgery. This is normal and the skin will soften over time.   For Men Only: It's okay to shave around the incision but do not shave the incision itself for 2 weeks. It is common to have numbness under your chin that could last for several months.  Diet  Resume your normal diet. There are no special food restrictions following this procedure. A low fat/low cholesterol diet is recommended for all patients with vascular disease. In order to heal from your surgery, it is CRITICAL to get adequate nutrition. Your body requires vitamins, minerals, and protein. Vegetables are the best source of  vitamins and minerals. Vegetables also provide the perfect balance of protein. Processed food has little nutritional value, so try to avoid this.  Medications  Resume taking all of your medications unless your doctor or physician assistant tells you not to. If your incision is causing pain, you may take over-the- counter pain relievers such as acetaminophen (Tylenol). If you were prescribed a stronger pain medication, please be aware these medications can cause nausea and constipation. Prevent nausea by taking the medication with a snack or meal. Avoid constipation by drinking plenty of fluids and eating foods with a high amount of fiber, such as fruits, vegetables, and grains.   Do not take Tylenol if you are taking prescription pain medications.  Follow Up  Our office will schedule a follow up appointment 2-3 weeks following discharge.  Please call us immediately for any of the following conditions  . Increased pain, redness, drainage (pus) from your incision site. . Fever of 101 degrees or higher. . If you should develop stroke (slurred speech, difficulty swallowing, weakness on one side of your body, loss of vision) you should call 911 and go to the nearest emergency room. .  Reduce your risk of vascular disease:  . Stop smoking. If you would like help call [facility] at 1-800-QUIT-NOW (1-800-784-8669) or [facility] at 336-586-4000. . Manage your cholesterol . Maintain a desired weight . Control your diabetes . Keep your blood pressure down .  If you have any questions, please call the office at [phone]. 

## 2021-04-12 NOTE — Progress Notes (Signed)
Pt arrived to unit from  PACU , A/O x 4,  CCMD called . Right neck  incision level 0 Left Groin level  0. pt oriented to unit,Will continue to monitor.   Karna Christmas Amar Sippel, RN     04/12/21 1532  Vitals  Temp 98.2 F (36.8 C)  Temp Source Oral  BP 112/66  MAP (mmHg) 80  BP Location Left Arm  BP Method Automatic  Patient Position (if appropriate) Lying  Pulse Rate 70  Pulse Rate Source Monitor  ECG Heart Rate 67  Resp 12  Level of Consciousness  Level of Consciousness Alert  Oxygen Therapy  SpO2 100 %  O2 Device Room Air  O2 Flow Rate (L/min) 0 L/min  Art Line  Arterial Line BP 116/48  Arterial Line MAP (mmHg) 71 mmHg  Arterial Line Location Right radial  Pain Assessment  Pain Scale 0-10  Pain Score 1  MEWS Score  MEWS Temp 0  MEWS Systolic 0  MEWS Pulse 0  MEWS RR 1  MEWS LOC 0  MEWS Score 1  MEWS Score Color Green

## 2021-04-12 NOTE — Anesthesia Preprocedure Evaluation (Addendum)
Anesthesia Evaluation  Patient identified by MRN, date of birth, ID band Patient awake    Reviewed: Allergy & Precautions, NPO status , Patient's Chart, lab work & pertinent test results, reviewed documented beta blocker date and time   History of Anesthesia Complications Negative for: history of anesthetic complications  Airway Mallampati: IV  TM Distance: >3 FB Neck ROM: Full    Dental  (+) Dental Advisory Given   Pulmonary former smoker,    Pulmonary exam normal        Cardiovascular hypertension, Pt. on medications and Pt. on home beta blockers + CAD and + Cardiac Stents  Normal cardiovascular exam   '22 Carotid US - 60-79% right ICAS, 1-39% left ICAS  '22 TTE - EF 60 to 65%. Moderate asymmetric left ventricular hypertrophy of the posterior segment. Grade I  diastolic dysfunction (impaired relaxation). Aneurysm of the ascending aorta, measuring 50 mm. There is borderline dilatation of the aortic root, measuring 37 mm. There is multilobular protruding plaque involving the ascending aorta and descending aorta.     Neuro/Psych CVA, Residual Symptoms negative psych ROS   GI/Hepatic negative GI ROS, Neg liver ROS,   Endo/Other  diabetes, Type 2, Insulin Dependent, Oral Hypoglycemic Agents  Renal/GU negative Renal ROS     Musculoskeletal negative musculoskeletal ROS (+)   Abdominal   Peds  Hematology  (+) anemia ,  On plavix    Anesthesia Other Findings CT Angio Head/Neck - IMPRESSION: 1. Negative CTA for large vessel occlusion. 2. 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 3. 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 Widely patent flow through the graft. 4. 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 5. Cardiomegaly.   Reproductive/Obstetrics                           Anesthesia Physical Anesthesia Plan  ASA: 3  Anesthesia Plan: General   Post-op Pain Management:    Induction: Intravenous  PONV Risk Score and Plan: 3 and Treatment may vary due to age or medical condition, Ondansetron and Propofol infusion  Airway Management Planned: Oral ETT  Additional Equipment: Arterial line  Intra-op Plan:   Post-operative Plan: Extubation in OR  Informed Consent: I have reviewed the patients History and Physical, chart, labs and discussed the procedure including the risks, benefits and alternatives for the proposed anesthesia with the patient or authorized representative who has indicated his/her understanding and acceptance.     Dental advisory given  Plan Discussed with: CRNA and Anesthesiologist  Anesthesia Plan Comments: (Glidescope in room)      Anesthesia Quick Evaluation

## 2021-04-12 NOTE — Anesthesia Procedure Notes (Signed)
Arterial Line Insertion Start/End9/14/2022 12:00 PM, 04/12/2021 12:13 PM Performed by: Adair Laundry, CRNA, CRNA  Patient location: Pre-op. Preanesthetic checklist: patient identified, IV checked, risks and benefits discussed, surgical consent and monitors and equipment checked Lidocaine 1% used for infiltration Right, radial was placed Catheter size: 20 G Maximum sterile barriers used   Attempts: 1 Procedure performed without using ultrasound guided technique. Following insertion, dressing applied and Biopatch. Post procedure assessment: normal  Patient tolerated the procedure well with no immediate complications.

## 2021-04-12 NOTE — Anesthesia Postprocedure Evaluation (Signed)
Anesthesia Post Note  Patient: Erin Baker  Procedure(s) Performed: RIGHT TRANSCAROTID ARTERY REVASCULARIZATION (Right: Neck) ULTRASOUND GUIDANCE FOR VASCULAR ACCESS, LEFT FEMORAL VEIN (Left: Groin)     Patient location during evaluation: PACU Anesthesia Type: General Level of consciousness: awake and alert Pain management: pain level controlled Vital Signs Assessment: post-procedure vital signs reviewed and stable Respiratory status: spontaneous breathing, nonlabored ventilation and respiratory function stable Cardiovascular status: stable and blood pressure returned to baseline Anesthetic complications: no   No notable events documented.  Last Vitals:  Vitals:   04/12/21 1523 04/12/21 1532  BP: (!) 114/54 112/66  Pulse: 88 70  Resp: 17 12  Temp: 36.8 C 36.8 C  SpO2: 97% 100%    Last Pain:  Vitals:   04/12/21 1532  TempSrc: Oral  PainSc: 1                  Beryle Lathe

## 2021-04-12 NOTE — Progress Notes (Signed)
PT Cancellation Note  Patient Details Name: Erin Baker MRN: [id number] DOB: Mar 14, 1941   Cancelled Treatment:    Reason Eval/Treat Not Completed: Patient at procedure or test/unavailable Pt to OR for TCAR.  Will f/u at later date. Anise Salvo, PT Acute Rehab Services Pager [phone] [facility] [phone]   [name] 04/12/2021, 11:38 AM

## 2021-04-12 NOTE — Progress Notes (Signed)
PROGRESS NOTE    Erin Baker   MVE:[id number]  DOB: 10/31/1940  DOA: 04/04/2021 PCP: Pcp, No   Brief Narrative:  Erin Baker is a 80 year old female with CAD status post stenting, endovascular thoracic aneurysm repair, subclavian artery repair, hypertension and diabetes mellitus who presented to the hospital with dysarthria and was admitted for a CVA. MRI brain: Small acute right frontal infarct and numerous chronic supra and infratentorial infarcts   Subjective: She has no complaints today.    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 with unclear etiology -Presenting with dysarthria which has resolved  -LDL is 79-continue Lipitor 80 mg daily -A1c 7.7 -2D echo unremarkable for cardiac thrombus -Of note, patient had a loop recorder placed in 2019 which reportedly did not reveal any atrial fibrillation - Continue aspirin and Plavix she was taking at home -According to patient, she was previously on Eliquis for her infarcts but this was discontinued because of bleeding - Loop recorder interrogated and noted to be depleted of battery - PT OT recommending DC to home with 24-hour supervision - See below regarding ICA stenosis  Active Problems: ICA stenosis, vascular disease with history of  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 -CTA head and neck: 50% right and 40% left carotid stenosis Carotid ultrasound: Right ICA 60-79% stenosis, left ICA 1-39% stenosis - Undergoing TCAR today  Aortic root dilatation - 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 noted on 2D echo, dilatation of aortic root measuring 37 mm-multilobular protruding plaque involving the ascending aorta and ascending aorta    CAD (coronary artery disease) -Status post stenting - Continue metoprolol, antiplatelet agents and statin    Hypertension -Continue metoprolol-lisinopril and  furosemide on hold    Normocytic anemia -Hemoglobin ranges between 10-12    Type 2 diabetes mellitus -She takes glipizide at home -Hemoglobin A1c is 7.7 - Continue NovoLog sliding scale in hospital  Hypothyroidism - Continue Synthroid     Time spent in minutes: 35 DVT prophylaxis: enoxaparin (LOVENOX) injection 40 mg Start: 04/05/21 1000  Code Status: Full code Family Communication:  Level of Care: Level of care: Telemetry Medical Disposition Plan:  Status is: Inpatient  Remains inpatient appropriate because:Inpatient level of care appropriate due to severity of illness  Dispo: The patient is from: Home              Anticipated d/c is to: CIR              Patient currently is not medically stable to d/c.   Difficult to place patient No      Consultants:  Neurology Vascular surgery Procedures:  2D echo Antimicrobials:  Anti-infectives (From admission, onward)    Start     Dose/Rate Route Frequency Ordered Stop   04/12/21 0827  ceFAZolin (ANCEF) IVPB 2g/100 mL premix        2 g 200 mL/hr over 30 Minutes Intravenous 30 min pre-op 04/12/21 0827 04/12/21 1251        Objective: Vitals:   04/12/21 1439 04/12/21 1453 04/12/21 1509 04/12/21 1523  BP:  (!) 104/58 (!) 115/55 (!) 114/54  Pulse:  68 88 88  Resp: 16 (!) 22 19 17   Temp: (!) 97.1 F (36.2 C)   98.2 F (36.8 C)  TempSrc:      SpO2:  98% 96% 97%  Weight:      Height:       No intake  or output data in the 24 hours ending 04/12/21 1525 Filed Weights   04/04/21 1945 04/04/21 2355 04/12/21 1141  Weight: 74.8 kg 74.9 kg 74.9 kg    Examination: General exam: Appears comfortable  HEENT: PERRLA, oral mucosa moist, no sclera icterus or thrush Respiratory system: Clear to auscultation. Respiratory effort normal. Cardiovascular system: S1 & S2 heard, RRR.   Gastrointestinal system: Abdomen soft, non-tender, nondistended. Normal bowel sounds. Central nervous system: Alert and oriented. No focal  neurological deficits. Extremities: No cyanosis, clubbing or edema Skin: No rashes or ulcers Psychiatry:  Mood & affect appropriate.     Data Reviewed: I have personally reviewed following labs and imaging studies  CBC: Recent Labs  Lab 04/06/21 0104 04/11/21 0123 04/12/21 0440  WBC 9.3 10.6* 9.6  HGB 10.5* 10.7* 10.8*  HCT 34.5* 34.5* 34.5*  MCV 82.7 82.7 82.1  PLT 231 240 224   Basic Metabolic Panel: Recent Labs  Lab 04/06/21 0104 04/11/21 0123 04/12/21 0440  NA 135 136 135  K 4.2 4.2 4.6  CL 104 103 100  CO2 23 27 29   GLUCOSE 226* 122* 130*  BUN 22 24* 23  CREATININE 1.00 1.03* 1.12*  CALCIUM 9.0 8.5* 8.8*  MG  --  1.7 1.9   GFR: Estimated Creatinine Clearance: 42 mL/min (A) (by C-G formula based on SCr of 1.12 mg/dL (H)). Liver Function Tests: No results for input(s): AST, ALT, ALKPHOS, BILITOT, PROT, ALBUMIN in the last 168 hours. No results for input(s): LIPASE, AMYLASE in the last 168 hours. No results for input(s): AMMONIA in the last 168 hours. Coagulation Profile: Recent Labs  Lab 04/12/21 0911  INR 1.1   Cardiac Enzymes: No results for input(s): CKTOTAL, CKMB, CKMBINDEX, TROPONINI in the last 168 hours. BNP (last 3 results) No results for input(s): PROBNP in the last 8760 hours. HbA1C: No results for input(s): HGBA1C in the last 72 hours. CBG: Recent Labs  Lab 04/11/21 1647 04/11/21 2134 04/12/21 0543 04/12/21 1125 04/12/21 1436  GLUCAP 134* 180* 134* 86 71   Lipid Profile: No results for input(s): CHOL, HDL, LDLCALC, TRIG, CHOLHDL, LDLDIRECT in the last 72 hours. Thyroid Function Tests: No results for input(s): TSH, T4TOTAL, FREET4, T3FREE, THYROIDAB in the last 72 hours. Anemia Panel: No results for input(s): VITAMINB12, FOLATE, FERRITIN, TIBC, IRON, RETICCTPCT in the last 72 hours. Urine analysis:    Component Value Date/Time   COLORURINE YELLOW 04/04/2021 2252   APPEARANCEUR CLOUDY (A) 04/04/2021 2252   LABSPEC 1.020  04/04/2021 2252   PHURINE 7.0 04/04/2021 2252   GLUCOSEU NEGATIVE 04/04/2021 2252   HGBUR NEGATIVE 04/04/2021 2252   BILIRUBINUR NEGATIVE 04/04/2021 2252   KETONESUR NEGATIVE 04/04/2021 2252   PROTEINUR NEGATIVE 04/04/2021 2252   NITRITE POSITIVE (A) 04/04/2021 2252   LEUKOCYTESUR NEGATIVE 04/04/2021 2252   Sepsis Labs: @LABRCNTIP (procalcitonin:4,lacticidven:4) ) Recent Results (from the past 240 hour(s))  Resp Panel by RT-PCR (Flu A&B, Covid) Nasopharyngeal Swab     Status: None   Collection Time: 04/04/21  7:53 PM   Specimen: Nasopharyngeal Swab; Nasopharyngeal(NP) swabs in vial transport medium  Result Value Ref Range Status   SARS Coronavirus 2 by RT PCR NEGATIVE NEGATIVE Final    Comment: (NOTE) SARS-CoV-2 target nucleic acids are NOT DETECTED.  The SARS-CoV-2 RNA is generally detectable in upper respiratory specimens during the acute phase of infection. The lowest concentration of SARS-CoV-2 viral copies this assay can detect is 138 copies/mL. A negative result does not preclude SARS-Cov-2 infection and should not be used as  the sole basis for treatment or other patient management decisions. A negative result may occur with  improper specimen collection/handling, submission of specimen other than nasopharyngeal swab, presence of viral mutation(s) within the areas targeted by this assay, and inadequate number of viral copies(<138 copies/mL). A negative result must be combined with clinical observations, patient history, and epidemiological information. The expected result is Negative.  Fact Sheet for Patients:  BloggerCourse.com  Fact Sheet for Healthcare Providers:  SeriousBroker.it  This test is no t yet approved or cleared by the [place] FDA and  has been authorized for detection and/or diagnosis of SARS-CoV-2 by FDA under an Emergency Use Authorization (EUA). This EUA will remain  in effect (meaning this test  can be used) for the duration of the COVID-19 declaration under Section 564(b)(1) of the Act, 21 U.S.C.section 360bbb-3(b)(1), unless the authorization is terminated  or revoked sooner.       Influenza A by PCR NEGATIVE NEGATIVE Final   Influenza B by PCR NEGATIVE NEGATIVE Final    Comment: (NOTE) The Xpert Xpress SARS-CoV-2/FLU/RSV plus assay is intended as an aid in the diagnosis of influenza from Nasopharyngeal swab specimens and should not be used as a sole basis for treatment. Nasal washings and aspirates are unacceptable for Xpert Xpress SARS-CoV-2/FLU/RSV testing.  Fact Sheet for Patients: BloggerCourse.com  Fact Sheet for Healthcare Providers: SeriousBroker.it  This test is not yet approved or cleared by the [place] FDA and has been authorized for detection and/or diagnosis of SARS-CoV-2 by FDA under an Emergency Use Authorization (EUA). This EUA will remain in effect (meaning this test can be used) for the duration of the COVID-19 declaration under Section 564(b)(1) of the Act, 21 U.S.C. section 360bbb-3(b)(1), unless the authorization is terminated or revoked.  Performed at [facility], [address] [place], [place] [address]   Culture, blood (single)     Status: None   Collection Time: 04/06/21  1:04 AM   Specimen: BLOOD RIGHT HAND  Result Value Ref Range Status   Specimen Description BLOOD RIGHT HAND  Final   Special Requests   Final    BOTTLES DRAWN AEROBIC AND ANAEROBIC Blood Culture adequate volume   Culture   Final    NO GROWTH 5 DAYS Performed at [facility], [address] [place], [place] [address]    Report Status 04/11/2021 FINAL  Final  Surgical pcr screen     Status: None   Collection Time: 04/11/21 10:45 PM   Specimen: Nasal Mucosa; Nasal Swab  Result Value Ref Range Status   MRSA, PCR NEGATIVE NEGATIVE Final   Staphylococcus aureus NEGATIVE NEGATIVE Final     Comment: (NOTE) The Xpert SA Assay (FDA approved for NASAL specimens in patients 31 years of age and older), is one component of a comprehensive surveillance program. It is not intended to diagnose infection nor to guide or monitor treatment. Performed at [facility], [address], [place], [place] [address]          Radiology Studies: Structural Heart Procedure  Result Date: 04/12/2021 See surgical note for result.  HYBRID OR IMAGING ([facility] ONLY)  Result Date: 04/12/2021 There is no interpretation for this exam.  This order is for images obtained during a surgical procedure.  Please See "Surgeries" Tab for more information regarding the procedure.      Scheduled Meds:  acetaminophen       [MAR Hold] aspirin EC  81 mg Oral Daily   [  MAR Hold] atorvastatin  80 mg Oral Daily   [MAR Hold] clopidogrel  75 mg Oral Daily   [MAR Hold] enoxaparin (LOVENOX) injection  40 mg Subcutaneous Q24H   [MAR Hold] insulin aspart  0-9 Units Subcutaneous TID WC   [MAR Hold] insulin detemir  30 Units Subcutaneous QHS   [MAR Hold] levothyroxine  100 mcg Oral Q0600   [MAR Hold] metoprolol tartrate  25 mg Oral BID   [MAR Hold] sertraline  50 mg Oral Daily   Continuous Infusions:  sodium chloride     lactated ringers 10 mL/hr at 04/12/21 1204     LOS: 8 days      Calvert Cantor, MD [facility] Pager: [email or web address] 04/12/2021, 3:25 PM

## 2021-04-13 ENCOUNTER — Encounter (HOSPITAL_COMMUNITY): Payer: Self-pay | Admitting: Vascular Surgery

## 2021-04-13 DIAGNOSIS — I712 Thoracic aortic aneurysm, without rupture: Secondary | ICD-10-CM

## 2021-04-13 DIAGNOSIS — I7121 Aneurysm of the ascending aorta, without rupture: Secondary | ICD-10-CM

## 2021-04-13 DIAGNOSIS — R471 Dysarthria and anarthria: Secondary | ICD-10-CM | POA: Diagnosis not present

## 2021-04-13 DIAGNOSIS — D649 Anemia, unspecified: Secondary | ICD-10-CM

## 2021-04-13 DIAGNOSIS — I639 Cerebral infarction, unspecified: Secondary | ICD-10-CM

## 2021-04-13 DIAGNOSIS — I6521 Occlusion and stenosis of right carotid artery: Secondary | ICD-10-CM | POA: Diagnosis not present

## 2021-04-13 DIAGNOSIS — I10 Essential (primary) hypertension: Secondary | ICD-10-CM

## 2021-04-13 DIAGNOSIS — E039 Hypothyroidism, unspecified: Secondary | ICD-10-CM

## 2021-04-13 LAB — URINALYSIS, ROUTINE W REFLEX MICROSCOPIC
Bilirubin Urine: NEGATIVE
Glucose, UA: 150 mg/dL — AB
Hgb urine dipstick: NEGATIVE
Ketones, ur: NEGATIVE mg/dL
Nitrite: POSITIVE — AB
Protein, ur: NEGATIVE mg/dL
Specific Gravity, Urine: 1.019 (ref 1.005–1.030)
pH: 6 (ref 5.0–8.0)

## 2021-04-13 LAB — LIPID PANEL
Cholesterol: 104 mg/dL (ref 0–200)
HDL: 32 mg/dL — ABNORMAL LOW (ref >40)
LDL Cholesterol: 60 mg/dL (ref 0–99)
Total CHOL/HDL Ratio: 3.3 RATIO
Triglycerides: 59 mg/dL (ref <150)
VLDL: 12 mg/dL (ref 0–40)

## 2021-04-13 LAB — BASIC METABOLIC PANEL
Anion gap: 11 (ref 5–15)
BUN: 31 mg/dL — ABNORMAL HIGH (ref 8–23)
CO2: 23 mmol/L (ref 22–32)
Calcium: 8.4 mg/dL — ABNORMAL LOW (ref 8.9–10.3)
Chloride: 99 mmol/L (ref 98–111)
Creatinine, Ser: 1.35 mg/dL — ABNORMAL HIGH (ref 0.44–1.00)
GFR, Estimated: 40 mL/min — ABNORMAL LOW (ref >60)
Glucose, Bld: 304 mg/dL — ABNORMAL HIGH (ref 70–99)
Potassium: 4.9 mmol/L (ref 3.5–5.1)
Sodium: 133 mmol/L — ABNORMAL LOW (ref 135–145)

## 2021-04-13 LAB — CBC
HCT: 31.2 % — ABNORMAL LOW (ref 36.0–46.0)
Hemoglobin: 9.5 g/dL — ABNORMAL LOW (ref 12.0–15.0)
MCH: 25.5 pg — ABNORMAL LOW (ref 26.0–34.0)
MCHC: 30.4 g/dL (ref 30.0–36.0)
MCV: 83.9 fL (ref 80.0–100.0)
Platelets: 233 10*3/uL (ref 150–400)
RBC: 3.72 MIL/uL — ABNORMAL LOW (ref 3.87–5.11)
RDW: 16.4 % — ABNORMAL HIGH (ref 11.5–15.5)
WBC: 13.6 10*3/uL — ABNORMAL HIGH (ref 4.0–10.5)
nRBC: 0 % (ref 0.0–0.2)

## 2021-04-13 LAB — POCT ACTIVATED CLOTTING TIME
Activated Clotting Time: 295 seconds
Activated Clotting Time: 295 seconds

## 2021-04-13 LAB — GLUCOSE, CAPILLARY
Glucose-Capillary: 279 mg/dL — ABNORMAL HIGH (ref 70–99)
Glucose-Capillary: 235 mg/dL — ABNORMAL HIGH (ref 70–99)

## 2021-04-13 NOTE — Progress Notes (Addendum)
  Progress Note    04/13/2021 7:30 AM 1 Day Post-Op  Subjective:  had trouble urinating overnight-felt like a new person after I&O cath.  Doing well from surgery.  Feels like she can go home.   Afebrile HR 50's-70's  100's-140's systolic 97% RA   Vitals:   04/13/21 0300 04/13/21 0718  BP: 138/71 131/68  Pulse: 68 61  Resp: 19 18  Temp: 97.9 F (36.6 C) (!) 97.4 F (36.3 C)  SpO2: 97% 97%     Physical Exam: Neuro:  in tact Lungs:  non labored Incision:  right neck is clean and dry with mild ecchymosis; left groin is soft without hematoma.  CBC    Component Value Date/Time   WBC 13.6 (H) 04/13/2021 0517   RBC 3.72 (L) 04/13/2021 0517   HGB 9.5 (L) 04/13/2021 0517   HCT 31.2 (L) 04/13/2021 0517   PLT 233 04/13/2021 0517   MCV 83.9 04/13/2021 0517   MCH 25.5 (L) 04/13/2021 0517   MCHC 30.4 04/13/2021 0517   RDW 16.4 (H) 04/13/2021 0517   LYMPHSABS 1.9 04/05/2021 0011   MONOABS 0.9 04/05/2021 0011   EOSABS 0.2 04/05/2021 0011   BASOSABS 0.0 04/05/2021 0011    BMET    Component Value Date/Time   NA 133 (L) 04/13/2021 0517   K 4.9 04/13/2021 0517   CL 99 04/13/2021 0517   CO2 23 04/13/2021 0517   GLUCOSE 304 (H) 04/13/2021 0517   BUN 31 (H) 04/13/2021 0517   CREATININE 1.35 (H) 04/13/2021 0517   CALCIUM 8.4 (L) 04/13/2021 0517   GFRNONAA 40 (L) 04/13/2021 0517     Intake/Output Summary (Last 24 hours) at 04/13/2021 0730 Last data filed at 04/12/2021 1700 Gross per 24 hour  Intake 95.31 ml  Output --  Net 95.31 ml     Assessment/Plan:  This is a 80 y.o. female who is s/p right TCAR 1 Day Post-Op  -pt is doing well this am. -pt neuro exam is in tact -she did require I&O cath this am and u/a was sent. -ok for discharge from vascular standpoint.  She will f/u in 4 weeks with carotid duplex and our office will arrange this appt.  -continue asa/statin/plavix    [name], PA-C [facility] [phone]  I have seen and  evaluated the patient. I agree with the PA note as documented above.  Postop day 1 status post right TCAR with flow reversal for moderate 60 to 79% symptomatic right ICA stenosis.  Has done well overnight.  Neurologically intact this morning.  Neck incision and groin incision look good.  Okay for discharge from my standpoint.  She did have urinary retention overnight and had to have I&O x1.  There is some concern from the daughter about her being discharged home without 24-hour assistance but the patient herself feels like she can go home and take care of her self.  Discussed aspirin Plavix statin at discharge.  We will arrange follow-up in 1 month with carotid duplex for surveillance of stent.  Cephus Shelling, MD [facility] Office: [phone]

## 2021-04-13 NOTE — Progress Notes (Signed)
0214: pt was having difficulty urinating, pt stated she had pressure, and discomfort in her abdominal area. Pt stood up to see if she could urinate, but she has able to do very little. Also pt was able to urinate last night standing up, but did struggle. Urine had odor.  0210: Bladder scan showed >379.  0214: On call MD Dr. Stacey Drain notified, received order for I/o cath and UA sample.  0230: I/o cath completed with 600cc clear  yellow urine output.  Pt stated she feels like different person. Resting comfortably in bed. Will continue to monitor.

## 2021-04-13 NOTE — TOC Transition Note (Addendum)
Transition of Care (TOC) - CM/SW Discharge Note Donn Pierini RN, BSN Transitions of Care Unit 4E- RN Case Manager See Treatment Team for direct phone #    Patient Details  Name: Erin Baker MRN: [id number] Date of Birth: [birth year]  Transition of Care [facility]) CM/SW Contact:  Darrold Span, RN Phone Number: 04/13/2021, 3:04 PM   Clinical Narrative:    Pt stable for transition home today, s/p TCAR. Per PT notes pt has progressed well and updated recs now for Home with [facility] and intermittent supervision. CM at bedside and have discussed with pt and daughter updated recommendation. Daughter still with some concerns about pt being at home alone. Discussed that pt is close to baseline and that safety concerns are something that patient and daughter need to discuss on a long term plan such as ALF or private pay assistance to come into the home to assist.  Pt has declined DME needs.   List provided for [facility] choice- Per CMS guidelines from medicare.gov website with star ratings (copy placed in shadow chart) , discussed that this would be intermittent visits a couple times a week, answered pt's questions regarding rehab and that insurance at this time would likely not cover. After review of list pt has selected [facility] for her HH needs as first choice.  Daughter also has form that she is requesting MD to sign for patients supplemental plan to cover ongoing HH needs. MD agreeable and has completed the form- form given back to daughter at the bedside prior to discharge.   Call made to [facility] with [facility] for [facility] referral- referral has been accepted for RN/PT/aide needs.   PCP is at [facility]- Dr. Cato Mulligan   Final next level of care: Home w Home Health Services Barriers to Discharge: Barriers Resolved   Patient Goals and CMS Choice Patient states their goals for this hospitalization and ongoing recovery are:: home w/ [facility] CMS Medicare.gov Compare Post Acute Care list provided to::  Patient Choice offered to / list presented to : Patient, Adult Children  Discharge Placement               Home w/ [facility]        Discharge Plan and Services In-house Referral: Clinical Social Work Discharge Planning Services: CM Consult Post Acute Care Choice: Home Health          DME Arranged: N/A DME Agency: NA       HH Arranged: RN, PT, Nurse's Aide HH Agency: [facility] Date [facility] Agency Contacted: 04/13/21 Time HH Agency Contacted: 1300 Representative spoke with at [facility] Agency: Kandee Keen  Social Determinants of Health (SDOH) Interventions     Readmission Risk Interventions Readmission Risk Prevention Plan 04/13/2021  Transportation Screening Complete  PCP or Specialist Appt within 5-7 Days Complete  Home Care Screening Complete  Medication Review (RN CM) Complete

## 2021-04-13 NOTE — Progress Notes (Signed)
Physical Therapy Treatment Patient Details Name: Erin Baker MRN: [id number] DOB: October 27, 1940 Today's Date: 04/13/2021   History of Present Illness Pt is a 80 y.o. female admitted 04/04/21 with dysarthria. CT angio (+) plaque around carotid bifurcation. MRI with Rt frontal infarct. S/p R TCAR on 9/14. PMH includes CAD s/p stenting, CVA x5, thoracic endovascular aneurysm s/p repair, subclavian artery repair, HTN, DMII, tobacco use.   PT Comments    Pt progressing with mobility, preparing for d/c today. Today's session focused on ambulation without DME, as pt continues to adamantly decline DME use despite recommendation for added stability; she owns RW, [facility] and rollator, but prefers to furniture surf if needed. Further educ re: fall risk reduction, activity recommendations, safety recommendations with iADLs/household tasks. Pt would benefit from [facility] aide services for added support and safety at home.    Recommendations for follow up therapy are one component of a multi-disciplinary discharge planning process, led by the attending physician.  Recommendations may be updated based on patient status, additional functional criteria and insurance authorization.  Follow Up Recommendations  Home health PT;Supervision - Intermittent     Equipment Recommendations  3in1 (PT)    Recommendations for Other Services       Precautions / Restrictions Precautions Precautions: Fall Restrictions Weight Bearing Restrictions: No     Mobility  Bed Mobility Overal bed mobility: Independent                  Transfers Overall transfer level: Independent Equipment used: None Transfers: Sit to/from Stand              Ambulation/Gait Ambulation/Gait assistance: Chief Operating Officer (Feet): 400 Feet Assistive device: None Gait Pattern/deviations: Step-through pattern;Decreased stride length Gait velocity: Decreased   General Gait Details: Slow, mostly steady gait without DME;  intermittent rest break with UE support on wall/rail, reports increased fatigue; educ on not reaching outside of BOS to rest secondary to fall risk; pt adamantly declines use of DME   Stairs             Wheelchair Mobility    Modified Rankin (Stroke Patients Only)       Balance Overall balance assessment: Needs assistance Sitting-balance support: Feet supported Sitting balance-[name] Scale: Good       Standing balance-[name] Scale: Fair Standing balance comment: dynamic stability beyond walking on level surface improved with UE support                            Cognition Arousal/Alertness: Awake/alert Behavior During Therapy: WFL for tasks assessed/performed Overall Cognitive Status: Within Functional Limits for tasks assessed                                 General Comments: WFL for simple tasks, 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      Exercises      General Comments General comments (skin integrity, edema, etc.): increased discussing d/c today, as well as recommendation for DME use (specifically RW or rollator) for added stability, pt adamantly against using DME despite education, reports she will furniture surf; further educ re: fall risk reduction, activity recommendations, safety recommendations for iADLs and household tasks      Pertinent Vitals/Pain Pain Assessment: Faces Faces Pain Scale: Hurts a little bit Pain Location: neck incision Pain  Descriptors / Indicators: Discomfort;Sore Pain Intervention(s): Monitored during session    Home Living                      Prior Function            PT Goals (current goals can now be found in the care plan section) Progress towards PT goals: Progressing toward goals    Frequency    Min 3X/week      PT Plan Current plan remains appropriate     Co-evaluation              AM-PAC PT "6 Clicks" Mobility   Outcome Measure  Help needed turning from your back to your side while in a flat bed without using bedrails?: None Help needed moving from lying on your back to sitting on the side of a flat bed without using bedrails?: None Help needed moving to and from a bed to a chair (including a wheelchair)?: None Help needed standing up from a chair using your arms (e.g., wheelchair or bedside chair)?: None Help needed to walk in hospital room?: A Little Help needed climbing 3-5 steps with a railing? : A Little 6 Click Score: 22    End of Session Equipment Utilized During Treatment: Gait belt Activity Tolerance: Patient tolerated treatment well Patient left: in chair;with call bell/phone within reach;with chair alarm set Nurse Communication: Mobility status PT Visit Diagnosis: Other abnormalities of gait and mobility (R26.89);Difficulty in walking, not elsewhere classified (R26.2)     Time: 6712-4580 PT Time Calculation (min) (ACUTE ONLY): 18 min  Charges:  $Self Care/Home Management: 8-22                     Ina Homes, PT, DPT Acute Rehabilitation Services  Pager [phone] Office [phone]  Malachy Chamber 04/13/2021, 10:53 AM

## 2021-04-13 NOTE — Progress Notes (Signed)
AVS printed, waiting on family member to give instructions to.

## 2021-04-13 NOTE — Discharge Summary (Addendum)
Physician Discharge Summary  Erin Baker ZYS:[id number] DOB: 1941/07/02 DOA: 04/04/2021  PCP: Pcp, No  Admit date: 04/04/2021 Discharge date: 04/13/2021  Admitted From: home  Disposition:  home   Recommendations for Outpatient Follow-up:  F/u aortic aneurysm  Home Health:  ordered  Discharge Condition:  stable   CODE STATUS:  Full code   Diet recommendation:  heart healthy, diabetic Consultations: Neurology Vascular surgery  Procedures/Studies: TCAR   Discharge Diagnoses:  Principal Problem:   Cerebral embolism with cerebral infarction Active Problems:   Carotid stenosis, right   CAD (coronary artery disease)   Hypertension   Normocytic anemia   Type 2 diabetes mellitus with vascular disease (HCC)   Ascending aortic aneurysm (HCC)   Hypothyroidism     Brief Summary: Erin Baker is a 80 year old female with CAD status post stenting, endovascular thoracic aneurysm repair, subclavian artery repair, hypertension and diabetes mellitus who presented to the hospital with dysarthria and was admitted for a CVA. 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 with unclear etiology -Presenting with dysarthria which has resolved  -LDL is 79-continue Lipitor 80 mg daily -A1c 7.7 -2D echo unremarkable for cardiac thrombus -Of note, patient had a loop recorder placed in 2019 which reportedly did not reveal any atrial fibrillation - Continue aspirin and Plavix she was taking at home -According to patient, she was previously on Eliquis for her infarcts but this was discontinued because of bleeding - Loop recorder interrogated and noted to be depleted of battery - PT OT recommending DC to home with home health with 24-hour supervision- per daughter, the patient has an Brewing technologist that can assist with an aid at home - See below  regarding ICA stenosis  Active Problems: Right ICA stenosis, vascular disease with history of  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 Overall, she is doing well post op    Aortic root dilatation - 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 noted on 2D echo, dilatation of aortic root measuring 37 mm-multilobular protruding plaque involving the ascending aorta and ascending aorta     CAD (coronary artery disease) -Status post stenting - Continue metoprolol, antiplatelet agents and statin     Hypertension -Continue metoprolol-lisinopril and furosemide on hold     Normocytic anemia -Hemoglobin ranges between 10-12     Type 2 diabetes mellitus -She takes glipizide and Levemir at home -Hemoglobin A1c is 7.7   Hypothyroidism - Continue Synthroid     Discharge Exam: Vitals:   04/13/21 0640 04/13/21 0718  BP: 127/69 131/68  Pulse: 65 61  Resp: 20 18  Temp: 97.6 F (36.4 C) (!) 97.4 F (36.3 C)  SpO2: 98% 97%   Vitals:   04/13/21 0240 04/13/21 0300 04/13/21 0640 04/13/21 0718  BP: 133/64 138/71 127/69 131/68  Pulse: 68 68 65 61  Resp: 19 19 20 18   Temp: 97.9 F (36.6 C) 97.9 F (36.6 C) 97.6 F (36.4 C) (!) 97.4 F (36.3 C)  TempSrc: Axillary Axillary Axillary Oral  SpO2: 98% 97% 98% 97%  Weight:  Height:        General: Pt is alert, awake, not in acute distress Cardiovascular: RRR, S1/S2 +, no rubs, no gallops Respiratory: CTA bilaterally, no wheezing, no rhonchi Abdominal: Soft, NT, ND, bowel sounds + Extremities: no edema, no cyanosis   Discharge Instructions  Discharge Instructions     Ambulatory referral to  Neurology   Complete by: As directed    Follow up with stroke clinic NP (Jessica Vanschaick or Darrol Angel, if both not available, consider Manson Allan, or Ahern) at [facility] in about 4 weeks. Thanks.   Diet - low sodium heart healthy   Complete by: As directed    Increase activity slowly   Complete by: As directed    No wound care   Complete by: As directed       Allergies as of 04/13/2021       Reactions   Oxycodone-acetaminophen Nausea And Vomiting, Shortness Of Breath   Macrobid [nitrofurantoin] Swelling   Morphine Other (See Comments)   Hyper, Confusion   Sulfa Antibiotics Swelling   Tramadol Other (See Comments)   hyper   Latex Rash   Levofloxacin Swelling, Rash        Medication List     TAKE these medications    aspirin 81 MG chewable tablet Chew 81 mg by mouth daily.   atorvastatin 80 MG tablet Commonly known as: LIPITOR Take 80 mg by mouth daily.   clopidogrel 75 MG tablet Commonly known as: PLAVIX Take 75 mg by mouth daily.   diphenhydrAMINE 25 MG tablet Commonly known as: BENADRYL Take 50 mg by mouth at bedtime as needed for sleep.   ergocalciferol 1.25 MG (50000 UT) capsule Commonly known as: VITAMIN D2 Take 50,000 Units by mouth once a week.   furosemide 20 MG tablet Commonly known as: LASIX Take 20 mg by mouth daily as needed for edema.   glipiZIDE 5 MG tablet Commonly known as: GLUCOTROL Take 5 mg by mouth 2 (two) times daily.   Levemir FlexTouch 100 UNIT/ML FlexPen Generic drug: insulin detemir Inject 25 Units into the skin daily.   levothyroxine 100 MCG tablet Commonly known as: SYNTHROID Take 100 mcg by mouth daily.   lisinopril 20 MG tablet Commonly known as: ZESTRIL Take 20 mg by mouth daily.   metoprolol tartrate 25 MG tablet Commonly known as: LOPRESSOR Take 25 mg by mouth 2 (two) times daily.   pantoprazole 40 MG tablet Commonly known as: PROTONIX Take 1 tablet by mouth daily.   sertraline 50 MG tablet Commonly  known as: ZOLOFT Take 50 mg by mouth daily.        Follow-up Information     [facility]. Schedule an appointment as soon as possible for a visit in 1 month(s).   Specialty: Neurology Why: stroke clinic Contact information: [address] [place] [address] [phone]        Cephus Shelling, MD Follow up in 4 week(s).   Specialty: Vascular Surgery Why: Office will call you to arrange your appt (sent) Contact information: [address] [place] [address] [phone]                Allergies  Allergen Reactions   Oxycodone-Acetaminophen Nausea And Vomiting and Shortness Of Breath   Macrobid [Nitrofurantoin] Swelling   Morphine Other (See Comments)    Hyper, Confusion   Sulfa Antibiotics Swelling   Tramadol Other (See Comments)    hyper   Latex Rash   Levofloxacin Swelling  and Rash      CT Angio Head W or Wo Contrast  Result Date: 04/04/2021 CLINICAL DATA:  Initial evaluation for acute stroke. EXAM: CT ANGIOGRAPHY HEAD AND NECK TECHNIQUE: Multidetector CT imaging of the head and neck was performed using the standard protocol during bolus administration of intravenous contrast. Multiplanar CT image reconstructions and MIPs were obtained to evaluate the vascular anatomy. Carotid stenosis measurements (when applicable) are obtained utilizing NASCET criteria, using the distal internal carotid diameter as the denominator. CONTRAST:  60mL OMNIPAQUE IOHEXOL 350 MG/ML SOLN COMPARISON:  Prior head CT from earlier the same day. 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 Origin of the innominate artery is markedly irregular without high-grade stenosis. 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 Left common to subclavian artery graft in place with perfusion of the left subclavian artery distally.  Widely patent flow through the graft. Right carotid system: Right CCA patent to the bifurcation without stenosis. Bulky calcified plaque about the right carotid bulb with associated 50% stenosis by NASCET criteria. Right ICA widely patent distally. Left carotid system: Left CCA patent from its origin to the bifurcation without stenosis. Bulky calcified plaque about the left carotid bulb/proximal left ICA with associated 40% stenosis by NASCET criteria. Left ICA widely patent distally. Vertebral arteries: Both vertebral arteries arise from subclavian arteries. Vertebral arteries widely patent within the neck without stenosis or other abnormality. Skeleton: No visible discrete osseous lesions. Other neck: Postsurgical changes present within the left neck. Upper chest: Cardiomegaly, partially visualized. Review of the MIP images confirms the above findings CTA HEAD FINDINGS Anterior circulation: Petrous segments patent bilaterally. Scattered atheromatous change throughout the carotid siphons with associated moderate multifocal narrowing. A1 segments patent bilaterally. Normal anterior communicating artery complex. Anterior cerebral arteries widely patent bilaterally. No M1 stenosis or occlusion. Distal MCA branches well perfused and symmetric. Posterior circulation: Both V4 segments patent to the vertebrobasilar junction without stenosis. Both PICA origins patent and normal. Basilar widely patent to its distal aspect without stenosis. Superior cerebellar arteries patent bilaterally. Both PCAs primarily supplied via the basilar and are well perfused to there distal aspects. Venous sinuses: Patent allowing for timing the contrast bolus. Anatomic variants: None significant.  No visible aneurysm. Review of the MIP images confirms the above findings IMPRESSION: 1. Negative CTA for large vessel occlusion. 2. Bulky calcified plaque about the carotid bifurcations bilaterally, with associated stenoses of up to 50% on the right  and 40% on the left. 3. 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 Widely patent flow through the graft. 4. 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 5. Cardiomegaly. Electronically Signed   By: Rise Mu M.D.   On: 04/04/2021 22:32   CT HEAD WO CONTRAST  Result Date: 04/04/2021 CLINICAL DATA:  Neuro deficit, acute, stroke suspected EXAM: CT HEAD WITHOUT CONTRAST TECHNIQUE: Contiguous axial images were obtained from the base of the skull through the vertex without intravenous contrast. COMPARISON:  None. FINDINGS: Brain: There is normal anatomic configuration of the brain. Mild parenchymal volume loss is commensurate with the patient's age. Mild periventricular white matter changes are present likely reflecting the sequela of small vessel ischemia. There are areas of encephalomalacia involving the cerebellar hemispheres bilaterally, right temporoparietal cortex, inferior right frontal lobe, and left high frontoparietal cortex  possibly the sequela of remote infarction. Additional lacunar infarcts are noted within the right lentiform nucleus, right caudate head, left corona radiata, right pons, and cerebellum. No definite evidence of acute intracranial infarct. No acute hemorrhage. No abnormal mass effect or midline shift. No intra or extra-axial mass lesion. Ventricular size is normal. Vascular: No asymmetric hyperdense vasculature at the skull base. Extensive atherosclerotic calcification within the carotid siphons. Skull: Intact Sinuses/Orbits: The visualized paranasal sinuses are clear. The orbits are unremarkable. Other: Mastoid air cells and middle ear cavities are clear. IMPRESSION: No acute intracranial abnormality identified. Multiple supratentorial and infratentorial foci of encephalomalacia, likely representing  multiple infarcts in both anterior and posterior circulatory territories. This may reflect the sequela of a central embolic source. Mild senescent change. Electronically Signed   By: Helyn Numbers M.D.   On: 04/04/2021 20:47   CT Angio Neck W and/or Wo Contrast  Result Date: 04/04/2021 CLINICAL DATA:  Initial evaluation for acute stroke. EXAM: CT ANGIOGRAPHY HEAD AND NECK TECHNIQUE: Multidetector CT imaging of the head and neck was performed using the standard protocol during bolus administration of intravenous contrast. Multiplanar CT image reconstructions and MIPs were obtained to evaluate the vascular anatomy. Carotid stenosis measurements (when applicable) are obtained utilizing NASCET criteria, using the distal internal carotid diameter as the denominator. CONTRAST:  60mL OMNIPAQUE IOHEXOL 350 MG/ML SOLN COMPARISON:  Prior head CT from earlier the same day. 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 Origin of the innominate artery is markedly irregular without high-grade stenosis. 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 Left common to subclavian artery graft in place with perfusion of the left subclavian artery distally. Widely patent flow through the graft. Right carotid system: Right CCA patent to the bifurcation without stenosis. Bulky calcified plaque about the right carotid bulb with associated 50% stenosis by NASCET criteria. Right ICA widely patent distally. Left carotid system: Left CCA patent from its origin to the bifurcation without stenosis. Bulky calcified plaque about the left carotid bulb/proximal left ICA with associated 40% stenosis by NASCET criteria. Left ICA widely patent distally. Vertebral arteries: Both vertebral arteries arise from subclavian arteries. Vertebral arteries widely patent within the neck without stenosis or other abnormality. Skeleton: No  visible discrete osseous lesions. Other neck: Postsurgical changes present within the left neck. Upper chest: Cardiomegaly, partially visualized. Review of the MIP images confirms the above findings CTA HEAD FINDINGS Anterior circulation: Petrous segments patent bilaterally. Scattered atheromatous change throughout the carotid siphons with associated moderate multifocal narrowing. A1 segments patent bilaterally. Normal anterior communicating artery complex. Anterior cerebral arteries widely patent bilaterally. No M1 stenosis or occlusion. Distal MCA branches well perfused and symmetric. Posterior circulation: Both V4 segments patent to the vertebrobasilar junction without stenosis. Both PICA origins patent and normal. Basilar widely patent to its distal aspect without stenosis. Superior cerebellar arteries patent bilaterally. Both PCAs primarily supplied via the basilar and are well perfused to there distal aspects. Venous sinuses: Patent allowing for timing the contrast bolus. Anatomic variants: None significant.  No visible aneurysm. Review of the MIP images confirms the above findings IMPRESSION: 1. Negative CTA for large vessel occlusion. 2. 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 3. 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 Widely  patent flow through the graft. 4. 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 5. Cardiomegaly. Electronically Signed   By: Rise Mu M.D.   On: 04/04/2021 22:32   MR BRAIN WO CONTRAST  Result Date: 04/05/2021 CLINICAL DATA:  Neuro deficit, acute, stroke suspected. EXAM: MRI HEAD WITHOUT CONTRAST TECHNIQUE: Multiplanar, multiecho pulse sequences of the brain and surrounding structures were  obtained without intravenous contrast. COMPARISON:  Head CT 04/04/2021 FINDINGS: Brain: There is a 1.2 cm acute cortical/subcortical infarct in the posterior right frontal lobe. Small to moderate-sized chronic infarcts are scattered throughout both cerebral hemispheres involving both MCA and PCA territories, and there are also numerous chronic bilateral cerebellar infarcts. Chronic lacunar infarcts are also noted in the basal ganglia bilaterally, right thalamus, and pons. Chronic microhemorrhages are present in the right corona radiata and right cerebellar hemisphere. There is mild cerebral atrophy. No mass, midline shift, or extra-axial fluid collection is identified. Vascular: Major intracranial vascular flow voids are preserved. Skull and upper cervical spine: Unremarkable bone marrow signal. Sinuses/Orbits: Bilateral cataract extraction. Mild mucosal thickening in the maxillary sinuses. Trace bilateral mastoid fluid. Other: None. IMPRESSION: 1. Small acute right frontal infarct. 2. Numerous chronic supratentorial and infratentorial infarcts. Electronically Signed   By: Sebastian Ache M.D.   On: 04/05/2021 12:57   DG CHEST PORT 1 VIEW  Result Date: 04/05/2021 CLINICAL DATA:  Pre MRI. Metal in chest. History of a stroke. EXAM: PORTABLE CHEST 1 VIEW COMPARISON:  None. FINDINGS: Thoracic aortic stent graft noted. There also multiple vascular clips in left supraclavicular region. A loop recorder is noted on left side. The heart is mildly enlarged. Elevation of the left hemidiaphragm. No acute pulmonary findings. The bony thorax is intact. IMPRESSION: 1. Thoracic aortic stent graft noted. 2. No acute cardiopulmonary findings. Electronically Signed   By: Rudie Meyer M.D.   On: 04/05/2021 06:50   ECHOCARDIOGRAM COMPLETE  Result Date: 04/05/2021    ECHOCARDIOGRAM REPORT   Patient Name:   APPLE DEARMAS Date of Exam: 04/05/2021 Medical Rec #:  [id number]    Height:       65.0 in Accession #:    [id number]   Weight:        165.1 lb Date of Birth:  03/21/1941    BSA:          1.823 m Patient Age:    54 years     BP:           102/77 mmHg Patient Gender: F            HR:           66 bpm. Exam Location:  Inpatient Procedure: 3D Echo, 2D Echo, Cardiac Doppler and Color Doppler Indications:    Stroke  History:        Patient has no prior history of Echocardiogram examinations.                 CAD, Stroke; Risk Factors:Diabetes and Current Smoker.  Sonographer:    Sheralyn Boatman RDCS Referring Phys: [phone] Meryle Ready [facility]  Sonographer Comments: Technically difficult study due to poor echo windows. IMPRESSIONS  1. Left ventricular ejection fraction, by estimation, is 60 to 65%. The left ventricle has normal function. The left ventricle has no regional wall motion abnormalities. There is moderate asymmetric left ventricular hypertrophy of the posterior segment.  Left ventricular diastolic parameters are consistent with Grade I diastolic dysfunction (impaired relaxation).  2. Right ventricular  systolic function is normal. The right ventricular size is normal.  3. The mitral valve is normal in structure. No evidence of mitral valve regurgitation. No evidence of mitral stenosis.  4. The aortic valve is normal in structure. Aortic valve regurgitation is not visualized. No aortic stenosis is present.  5. Aortic dilatation noted. Aneurysm of the ascending aorta, measuring 50 mm. There is borderline dilatation of the aortic root, measuring 37 mm. There is multilobular protruding plaque involving the ascending aorta and descending aorta.  6. The inferior vena cava is normal in size with greater than 50% respiratory variability, suggesting right atrial pressure of 3 mmHg.  7. Recommend Chest and Abdominal CTA for further assessment of aortic aneurysm as well as protruding plaque in the thoracic aorta which may be a source of stroke. FINDINGS  Left Ventricle: Left ventricular ejection fraction, by estimation, is 60 to 65%. The left ventricle has normal  function. The left ventricle has no regional wall motion abnormalities. The left ventricular internal cavity size was normal in size. There is  moderate asymmetric left ventricular hypertrophy of the posterior segment. Left ventricular diastolic parameters are consistent with Grade I diastolic dysfunction (impaired relaxation). Normal left ventricular filling pressure. Right Ventricle: The right ventricular size is normal. No increase in right ventricular wall thickness. Right ventricular systolic function is normal. Left Atrium: Left atrial size was normal in size. Right Atrium: Right atrial size was normal in size. Pericardium: There is no evidence of pericardial effusion. Mitral Valve: The mitral valve is normal in structure. No evidence of mitral valve regurgitation. No evidence of mitral valve stenosis. Tricuspid Valve: The tricuspid valve is normal in structure. Tricuspid valve regurgitation is not demonstrated. No evidence of tricuspid stenosis. Aortic Valve: The aortic valve is normal in structure. Aortic valve regurgitation is not visualized. No aortic stenosis is present. Pulmonic Valve: The pulmonic valve was normal in structure. Pulmonic valve regurgitation is trivial. No evidence of pulmonic stenosis. Aorta: Aortic dilatation noted. There is borderline dilatation of the aortic root, measuring 37 mm. 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 There is multilobular plaque involving the ascending aorta and descending aorta. Venous: The inferior vena cava is normal in size with greater than 50% respiratory variability, suggesting right atrial pressure of 3 mmHg. IAS/Shunts: No atrial level shunt detected by color flow Doppler.  LEFT VENTRICLE PLAX 2D LVIDd:         4.10 cm     Diastology LVIDs:         3.00 cm     LV e' medial:    8.70 cm/s LV PW:         1.40 cm     LV E/e' medial:  6.5 LV IVS:        1.10 cm     LV e' lateral:   6.74 cm/s LVOT diam:     2.00 cm     LV E/e'  lateral: 8.4 LV SV:         50 LV SV Index:   28 LVOT Area:     3.14 cm  LV Volumes (MOD) LV vol d, MOD A2C: 61.4 ml LV vol d, MOD A4C: 56.3 ml LV vol s, MOD A2C: 28.6 ml LV vol s, MOD A4C: 21.9 ml LV SV MOD A2C:     32.8 ml LV SV MOD A4C:     56.3 ml LV SV MOD BP:      33.8 ml RIGHT VENTRICLE  IVC RV S prime:     7.94 cm/s  IVC diam: 2.30 cm TAPSE (M-mode): 1.2 cm LEFT ATRIUM             Index       RIGHT ATRIUM          Index LA diam:        3.10 cm 1.70 cm/m  RA Area:     7.24 cm LA Vol (A2C):   18.6 ml 10.20 ml/m RA Volume:   12.10 ml 6.64 ml/m LA Vol (A4C):   20.2 ml 11.08 ml/m LA Biplane Vol: 19.5 ml 10.69 ml/m  AORTIC VALVE LVOT Vmax:   63.20 cm/s LVOT Vmean:  42.300 cm/s LVOT VTI:    0.160 m  AORTA Ao Root diam: 3.70 cm Ao Asc diam:  4.25 cm MITRAL VALVE MV Area (PHT): 2.45 cm    SHUNTS MV Decel Time: 310 msec    Systemic VTI:  0.16 m MV E velocity: 56.60 cm/s  Systemic Diam: 2.00 cm MV A velocity: 80.70 cm/s MV E/A ratio:  0.70 Armanda Magic MD Electronically signed by Armanda Magic MD Signature Date/Time: 04/05/2021/2:55:04 PM    Final    VAS US CAROTID  Result Date: 04/06/2021 Carotid Arterial Duplex Study Patient Name:  Andrey Spearman  Date of Exam:   04/06/2021 Medical Rec #: [id number]     Accession #:    [id number] Date of Birth: 01-26-1941     Patient Gender: F Patient Age:   89 years Exam Location:  [facility] Procedure:      VAS US CAROTID Referring Phys: Sherald Hess --------------------------------------------------------------------------------  Indications:       CVA, Speech disturbance and facial droop. CTA indicates 50%                    right ICA stenosis and 40% left ICA stenosis. Risk Factors:      Hypertension, Diabetes, past history of smoking, coronary                    artery disease, prior CVA. 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 Loop recorder. Comparison Study:  No prior study  Performing Technologist: Sherren Kerns RVS  Examination Guidelines: A complete evaluation includes B-mode imaging, spectral Doppler, color Doppler, and power Doppler as needed of all accessible portions of each vessel. Bilateral testing is considered an integral part of a complete examination. Limited examinations for reoccurring indications may be performed as noted.  Right Carotid Findings: +----------+--------+--------+--------+------------------+------------------+           PSV cm/sEDV cm/sStenosisPlaque DescriptionComments           +----------+--------+--------+--------+------------------+------------------+ CCA Prox  47      11                                intimal thickening +----------+--------+--------+--------+------------------+------------------+ CCA Distal42      14                                intimal thickening +----------+--------+--------+--------+------------------+------------------+ ICA Prox  201     69      60-79%  calcific  Shadowing          +----------+--------+--------+--------+------------------+------------------+ ICA Mid   123     38                                                   +----------+--------+--------+--------+------------------+------------------+ ICA Distal55      20                                                   +----------+--------+--------+--------+------------------+------------------+ ECA       117     12                                                   +----------+--------+--------+--------+------------------+------------------+ +----------+--------+-------+--------+-------------------+           PSV cm/sEDV cmsDescribeArm Pressure (mmHG) +----------+--------+-------+--------+-------------------+ [id number]                                        +----------+--------+-------+--------+-------------------+ +---------+--------+--+--------+--+ VertebralPSV cm/s32EDV cm/s11  +---------+--------+--+--------+--+  Left Carotid Findings: +----------+--------+--------+--------+---------------------+------------------+           PSV cm/sEDV cm/sStenosisPlaque Description   Comments           +----------+--------+--------+--------+---------------------+------------------+ CCA Prox  100     10                                   intimal thickening +----------+--------+--------+--------+---------------------+------------------+ CCA Distal91      11                                   intimal thickening +----------+--------+--------+--------+---------------------+------------------+ ICA Prox  119     34      1-39%   calcific and         Shadowing                                            irregular                               +----------+--------+--------+--------+---------------------+------------------+ ICA Distal86      34                                                      +----------+--------+--------+--------+---------------------+------------------+ ECA       104     11                                                      +----------+--------+--------+--------+---------------------+------------------+ +----------+--------+--------+--------+-------------------+  PSV cm/sEDV cm/sDescribeArm Pressure (mmHG) +----------+--------+--------+--------+-------------------+ Subclavian171                                         +----------+--------+--------+--------+-------------------+ +---------+--------+--+--------+--+ VertebralPSV cm/s58EDV cm/s12 +---------+--------+--+--------+--+   Summary: Right Carotid: 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 Left Carotid: Velocities in the left ICA are consistent with a 1-39% stenosis. Vertebrals:  Bilateral vertebral arteries demonstrate antegrade flow. Subclavians: Normal flow hemodynamics were seen in bilateral subclavian               arteries. *See table(s) above for measurements and observations.     Preliminary    Structural Heart Procedure  Result Date: 04/12/2021 See surgical note for result.  HYBRID OR IMAGING ([facility] ONLY)  Result Date: 04/12/2021 There is no interpretation for this exam.  This order is for images obtained during a surgical procedure.  Please See "Surgeries" Tab for more information regarding the procedure.     The results of significant diagnostics from this hospitalization (including imaging, microbiology, ancillary and laboratory) are listed below for reference.     Microbiology: Recent Results (from the past 240 hour(s))  Resp Panel by RT-PCR (Flu A&B, Covid) Nasopharyngeal Swab     Status: None   Collection Time: 04/04/21  7:53 PM   Specimen: Nasopharyngeal Swab; Nasopharyngeal(NP) swabs in vial transport medium  Result Value Ref Range Status   SARS Coronavirus 2 by RT PCR NEGATIVE NEGATIVE Final    Comment: (NOTE) SARS-CoV-2 target nucleic acids are NOT DETECTED.  The SARS-CoV-2 RNA is generally detectable in upper respiratory specimens during the acute phase of infection. The lowest concentration of SARS-CoV-2 viral copies this assay can detect is 138 copies/mL. A negative result does not preclude SARS-Cov-2 infection and should not be used as the sole basis for treatment or other patient management decisions. A negative result may occur with  improper specimen collection/handling, submission of specimen other than nasopharyngeal swab, presence of viral mutation(s) within the areas targeted by this assay, and inadequate number of viral copies(<138 copies/mL). A negative result must be combined with clinical observations, patient history, and epidemiological information. The expected result is Negative.  Fact Sheet for Patients:  BloggerCourse.com  Fact Sheet for Healthcare Providers:  SeriousBroker.it  This test is no t yet  approved or cleared by the [place] FDA and  has been authorized for detection and/or diagnosis of SARS-CoV-2 by FDA under an Emergency Use Authorization (EUA). This EUA will remain  in effect (meaning this test can be used) for the duration of the COVID-19 declaration under Section 564(b)(1) of the Act, 21 U.S.C.section 360bbb-3(b)(1), unless the authorization is terminated  or revoked sooner.       Influenza A by PCR NEGATIVE NEGATIVE Final   Influenza B by PCR NEGATIVE NEGATIVE Final    Comment: (NOTE) The Xpert Xpress SARS-CoV-2/FLU/RSV plus assay is intended as an aid in the diagnosis of influenza from Nasopharyngeal swab specimens and should not be used as a sole basis for treatment. Nasal washings and aspirates are unacceptable for Xpert Xpress SARS-CoV-2/FLU/RSV testing.  Fact Sheet for Patients: BloggerCourse.com  Fact Sheet for Healthcare Providers: SeriousBroker.it  This test is not yet approved or cleared by the [place] FDA and has been authorized for detection and/or diagnosis of SARS-CoV-2 by FDA under  an Emergency Use Authorization (EUA). This EUA will remain in effect (meaning this test can be used) for the duration of the COVID-19 declaration under Section 564(b)(1) of the Act, 21 U.S.C. section 360bbb-3(b)(1), unless the authorization is terminated or revoked.  Performed at [facility], [address] [place] [address] [place], [place] [address]   Culture, blood (single)     Status: None   Collection Time: 04/06/21  1:04 AM   Specimen: BLOOD RIGHT HAND  Result Value Ref Range Status   Specimen Description BLOOD RIGHT HAND  Final   Special Requests   Final    BOTTLES DRAWN AEROBIC AND ANAEROBIC Blood Culture adequate volume   Culture   Final    NO GROWTH 5 DAYS Performed at [facility], [address], [place], [place] [address]    Report Status 04/11/2021 FINAL  Final  Surgical pcr screen      Status: None   Collection Time: 04/11/21 10:45 PM   Specimen: Nasal Mucosa; Nasal Swab  Result Value Ref Range Status   MRSA, PCR NEGATIVE NEGATIVE Final   Staphylococcus aureus NEGATIVE NEGATIVE Final    Comment: (NOTE) The Xpert SA Assay (FDA approved for NASAL specimens in patients 59 years of age and older), is one component of a comprehensive surveillance program. It is not intended to diagnose infection nor to guide or monitor treatment. Performed at [facility], [address] [place], [place] [address]      Labs: BNP (last 3 results) No results for input(s): BNP in the last 8760 hours. Basic Metabolic Panel: Recent Labs  Lab 04/11/21 0123 04/12/21 0440 04/13/21 0517  NA 136 135 133*  K 4.2 4.6 4.9  CL 103 100 99  CO2 27 29 23   GLUCOSE 122* 130* 304*  BUN 24* 23 31*  CREATININE 1.03* 1.12* 1.35*  CALCIUM 8.5* 8.8* 8.4*  MG 1.7 1.9  --    Liver Function Tests: No results for input(s): AST, ALT, ALKPHOS, BILITOT, PROT, ALBUMIN in the last 168 hours. No results for input(s): LIPASE, AMYLASE in the last 168 hours. No results for input(s): AMMONIA in the last 168 hours. CBC: Recent Labs  Lab 04/11/21 0123 04/12/21 0440 04/13/21 0517  WBC 10.6* 9.6 13.6*  HGB 10.7* 10.8* 9.5*  HCT 34.5* 34.5* 31.2*  MCV 82.7 82.1 83.9  PLT 240 224 233   Cardiac Enzymes: No results for input(s): CKTOTAL, CKMB, CKMBINDEX, TROPONINI in the last 168 hours. BNP: Invalid input(s): POCBNP CBG: Recent Labs  Lab 04/12/21 1436 04/12/21 1544 04/12/21 1616 04/12/21 2107 04/13/21 0628  GLUCAP 71 80 90 357* 279*   D-Dimer No results for input(s): DDIMER in the last 72 hours. Hgb A1c No results for input(s): HGBA1C in the last 72 hours. Lipid Profile Recent Labs    04/13/21 0517  CHOL 104  HDL 32*  LDLCALC 60  TRIG 59  CHOLHDL 3.3   Thyroid function studies No results for input(s): TSH, T4TOTAL, T3FREE, THYROIDAB in the last 72 hours.  Invalid input(s):  FREET3 Anemia work up No results for input(s): VITAMINB12, FOLATE, FERRITIN, TIBC, IRON, RETICCTPCT in the last 72 hours. Urinalysis    Component Value Date/Time   COLORURINE YELLOW 04/13/2021 0250   APPEARANCEUR CLEAR 04/13/2021 0250   LABSPEC 1.019 04/13/2021 0250   PHURINE 6.0 04/13/2021 0250   GLUCOSEU 150 (A) 04/13/2021 0250   HGBUR NEGATIVE 04/13/2021 0250   BILIRUBINUR NEGATIVE 04/13/2021 0250   KETONESUR NEGATIVE 04/13/2021 0250   PROTEINUR  NEGATIVE 04/13/2021 0250   NITRITE POSITIVE (A) 04/13/2021 0250   LEUKOCYTESUR TRACE (A) 04/13/2021 0250   Sepsis Labs Invalid input(s): PROCALCITONIN,  WBC,  LACTICIDVEN Microbiology Recent Results (from the past 240 hour(s))  Resp Panel by RT-PCR (Flu A&B, Covid) Nasopharyngeal Swab     Status: None   Collection Time: 04/04/21  7:53 PM   Specimen: Nasopharyngeal Swab; Nasopharyngeal(NP) swabs in vial transport medium  Result Value Ref Range Status   SARS Coronavirus 2 by RT PCR NEGATIVE NEGATIVE Final    Comment: (NOTE) SARS-CoV-2 target nucleic acids are NOT DETECTED.  The SARS-CoV-2 RNA is generally detectable in upper respiratory specimens during the acute phase of infection. The lowest concentration of SARS-CoV-2 viral copies this assay can detect is 138 copies/mL. A negative result does not preclude SARS-Cov-2 infection and should not be used as the sole basis for treatment or other patient management decisions. A negative result may occur with  improper specimen collection/handling, submission of specimen other than nasopharyngeal swab, presence of viral mutation(s) within the areas targeted by this assay, and inadequate number of viral copies(<138 copies/mL). A negative result must be combined with clinical observations, patient history, and epidemiological information. The expected result is Negative.  Fact Sheet for Patients:  BloggerCourse.com  Fact Sheet for Healthcare Providers:   SeriousBroker.it  This test is no t yet approved or cleared by the [place] FDA and  has been authorized for detection and/or diagnosis of SARS-CoV-2 by FDA under an Emergency Use Authorization (EUA). This EUA will remain  in effect (meaning this test can be used) for the duration of the COVID-19 declaration under Section 564(b)(1) of the Act, 21 U.S.C.section 360bbb-3(b)(1), unless the authorization is terminated  or revoked sooner.       Influenza A by PCR NEGATIVE NEGATIVE Final   Influenza B by PCR NEGATIVE NEGATIVE Final    Comment: (NOTE) The Xpert Xpress SARS-CoV-2/FLU/RSV plus assay is intended as an aid in the diagnosis of influenza from Nasopharyngeal swab specimens and should not be used as a sole basis for treatment. Nasal washings and aspirates are unacceptable for Xpert Xpress SARS-CoV-2/FLU/RSV testing.  Fact Sheet for Patients: BloggerCourse.com  Fact Sheet for Healthcare Providers: SeriousBroker.it  This test is not yet approved or cleared by the [place] FDA and has been authorized for detection and/or diagnosis of SARS-CoV-2 by FDA under an Emergency Use Authorization (EUA). This EUA will remain in effect (meaning this test can be used) for the duration of the COVID-19 declaration under Section 564(b)(1) of the Act, 21 U.S.C. section 360bbb-3(b)(1), unless the authorization is terminated or revoked.  Performed at [facility], [address] [place], [place] [address]   Culture, blood (single)     Status: None   Collection Time: 04/06/21  1:04 AM   Specimen: BLOOD RIGHT HAND  Result Value Ref Range Status   Specimen Description BLOOD RIGHT HAND  Final   Special Requests   Final    BOTTLES DRAWN AEROBIC AND ANAEROBIC Blood Culture adequate volume   Culture   Final    NO GROWTH 5 DAYS Performed at [facility], [address] [place], [place]  [address]    Report Status 04/11/2021 FINAL  Final  Surgical pcr screen     Status: None   Collection Time: 04/11/21 10:45 PM   Specimen: Nasal Mucosa; Nasal Swab  Result Value Ref Range Status   MRSA, PCR NEGATIVE NEGATIVE Final   Staphylococcus aureus NEGATIVE NEGATIVE  Final    Comment: (NOTE) The Xpert SA Assay (FDA approved for NASAL specimens in patients 11 years of age and older), is one component of a comprehensive surveillance program. It is not intended to diagnose infection nor to guide or monitor treatment. Performed at [facility], [address] [place], [place] [address]      Time coordinating discharge in minutes: 65  SIGNED:   Calvert Cantor, MD  [facility] 04/13/2021, 9:29 AM

## 2021-04-17 ENCOUNTER — Other Ambulatory Visit: Payer: Self-pay | Admitting: *Deleted

## 2021-04-17 DIAGNOSIS — I639 Cerebral infarction, unspecified: Secondary | ICD-10-CM

## 2021-04-17 NOTE — Patient Outreach (Signed)
[facility]) Care Management  04/17/2021  Erin Baker [birth year] [id number]   RED ON EMMI ALERT - Stroke Day # 1 Date: 9/17 Red Alert Reason: Not scheduled follow up appointment   Outreach attempt #1, successful.  Identity verified.  This care manager introduced self and stated purpose of call.  [facility] care management services explained.    Member lives alone, daughter is a Engineer, civil (consulting) but lives out of town.  She is helping to manage health care from her own residence, but member needing additional help in the interim of recovery.  Daughter has purchased foods that are quick to put together, such as sandwiches and light breakfast.  Member report being weak, slightly unsteady.  Will have HH RN, PT, and bath aide, first visit from [facility] is scheduled for this afternoon.    Follow up appointments scheduled with PCP the end of this month and with vascular physician on 10/18.  Member was discharged on Friday, neurology office was closed over the weekend, daughter will call to schedule neurology follow up.  She will also provide transportation to appointments.    Member denies any questions about medications, discussed wound care for incision site.  Denies any swelling or bleeding.  State daughter is a Engineer, civil (consulting) and has also been providing guidance.    Plan: RN CM will send member stroke recovery and prevention education.  Will place referral to Care Guide for prepared meal assistance through [facility].  Will follow up within the next 2 weeks.  [name], [place], MSN [facility] Care Management  [facility] Manager [phone]

## 2021-04-24 ENCOUNTER — Telehealth: Payer: Self-pay

## 2021-04-24 NOTE — Telephone Encounter (Signed)
   Telephone encounter was:  Successful.  04/24/2021 Name: [name] MRN: [id number] DOB: 1941/02/14  Erin Baker is a 80 y.o. year old female who is a primary care patient of Clemencia Course, PA-C . The community resource team was consulted for assistance with Food Insecurity  Care guide performed the following interventions: Spoke with patient about Mom's Meals and patient asked that I call her daughter and give her the information.  Left message on voicemail for patient's daughter to return my call regarding information for Mom's Meals.  Follow Up Plan:  Care guide will follow up with patient by phone over the next 5 days  Elmo Rio, AAS Paralegal, [facility] Care Guide  Embedded Care Coordination [facility]  Care Management  [address] [place], [place] [address] ??[email or web address]  ?? [phone]   [email or web address]

## 2021-05-01 ENCOUNTER — Telehealth: Payer: Self-pay

## 2021-05-01 NOTE — Telephone Encounter (Signed)
   Telephone encounter was:  Unsuccessful.  05/01/2021 Name: Erin Baker MRN: [id number] DOB: May 15, 1941  Unsuccessful outbound call made today to assist with:   Mom's Meals.  Outreach Attempt:  2nd Attempt  A HIPAA compliant voice message was left requesting a return call.  Instructed patient to call back at [phone].  Bryce Kimble, AAS Paralegal, [facility] Care Guide  Embedded Care Coordination [facility]  Care Management  [address] [place], [place] [address] ??[email or web address]  ?? [phone]   [email or web address]

## 2021-05-03 ENCOUNTER — Telehealth: Payer: Self-pay

## 2021-05-03 ENCOUNTER — Other Ambulatory Visit: Payer: Self-pay | Admitting: *Deleted

## 2021-05-03 NOTE — Patient Outreach (Signed)
[facility]) Care Management  05/03/2021  Erin Baker June 18, 1941 [id number]   Outgoing call placed to member to follow up on stroke recovery.  State she has been doing well, continues to improve with PT sessions.  Vascular appointment has been changed from 10/18 to 11/1, Neurology office visit scheduled for 11/10.    Member made aware that [name] from [facility] has been trying to make contact regarding Mom's Meals.  Conference call placed, voice message left for Jody to call member directly.  Will continue to collaborate with Jody and Care Guide for resources.  Will follow up with member within the next week.  [name], [place], MSN [facility] Care Management  [facility] Manager [phone]

## 2021-05-03 NOTE — Telephone Encounter (Signed)
   Telephone encounter was:  Successful.  05/03/2021 Name: Erin Baker MRN: [id number] DOB: November 22, 1940  Erin Baker is a 80 y.o. year old female who is a primary care patient of Clemencia Course, PA-C . The community resource team was consulted for assistance with Food Insecurity  Care guide performed the following interventions: Follow up call placed to community resources to determine status of patients referral Follow up call placed to the patient to discuss status of referral.  Follow Up Plan:  Spoke with patient to let her know [name] with [employer] was trying to contact her to get her meals set-up.  [name], RN  has also spoken to the  patient  to let her know Augusto Gamble is trying to contact her.  Gave patient Jody's number so she would know to pick up the call. Patient also has my name and number.  Received the following email response from [name] at [employer] "I have left a message for this members daughter. I was not able to leave her a message as her mailbox was full. We will need to speak with them directly in order to submit and process a request for Mom's Meals. Thank you Jody.  Annalyn Blecher, AAS Paralegal, [facility] Care Guide  Embedded Care Coordination [place]  Care Management  [address] [place], [place] [address] ??[email or web address]  ?? [phone]   [email or web address]

## 2021-05-10 ENCOUNTER — Other Ambulatory Visit: Payer: Self-pay | Admitting: *Deleted

## 2021-05-10 NOTE — Patient Outreach (Signed)
[facility]) Care Management  05/10/2021  Erin Baker [birth year] [id number]   Outgoing call placed to member.  State she had PT today, increasing strength slowly.  Report PT has requested an extension to services.  Her daughter has been in contact with [name] for meal resources, they are waiting on call back.  Denies any urgent concerns, encouraged to contact this care manager with questions.  Will close case at this time, no additional urgent needs identified.  [name], [place], MSN [facility] Care Management  [facility] Manager [phone]

## 2021-05-11 ENCOUNTER — Telehealth: Payer: Self-pay

## 2021-05-11 NOTE — Telephone Encounter (Signed)
   Telephone encounter was:  Successful.  05/11/2021 Name: Erin Baker MRN: [id number] DOB: 1940/12/16  Erin Baker is a 80 y.o. year old female who is a primary care patient of Clemencia Course, PA-C . The community resource team was consulted for assistance with  Mom's Meals  Care guide performed the following interventions: eceived call from patient's daughter Helaine Chess.  Forwarded contact information for [employer] ext. [phone] via email.   Follow Up Plan:  No further follow up planned at this time. The patient has been provided with needed resources.  Jupiter Boys, AAS Paralegal, [facility] Care Guide  Embedded Care Coordination [place]  Care Management  [address] [place], [place] [address] ??[email or web address]  ?? [phone]   [email or web address]

## 2021-05-16 ENCOUNTER — Encounter (HOSPITAL_COMMUNITY): Payer: Self-pay

## 2021-05-16 ENCOUNTER — Encounter: Payer: Medicare Other | Admitting: Vascular Surgery

## 2021-05-22 ENCOUNTER — Encounter (HOSPITAL_BASED_OUTPATIENT_CLINIC_OR_DEPARTMENT_OTHER): Payer: Self-pay

## 2021-05-22 ENCOUNTER — Emergency Department (HOSPITAL_BASED_OUTPATIENT_CLINIC_OR_DEPARTMENT_OTHER)
Admission: EM | Admit: 2021-05-22 | Discharge: 2021-05-22 | Disposition: A | Discharge status: Home / Self Care | Payer: Medicare Other | Attending: Emergency Medicine | Admitting: Emergency Medicine

## 2021-05-22 ENCOUNTER — Emergency Department (HOSPITAL_BASED_OUTPATIENT_CLINIC_OR_DEPARTMENT_OTHER): Payer: Medicare Other

## 2021-05-22 ENCOUNTER — Other Ambulatory Visit: Payer: Self-pay

## 2021-05-22 DIAGNOSIS — Z5321 Procedure and treatment not carried out due to patient leaving prior to being seen by health care provider: Secondary | ICD-10-CM | POA: Diagnosis not present

## 2021-05-22 DIAGNOSIS — R0981 Nasal congestion: Secondary | ICD-10-CM | POA: Diagnosis not present

## 2021-05-22 DIAGNOSIS — R0789 Other chest pain: Secondary | ICD-10-CM | POA: Diagnosis not present

## 2021-05-22 DIAGNOSIS — R0602 Shortness of breath: Secondary | ICD-10-CM | POA: Insufficient documentation

## 2021-05-22 LAB — CBC
HCT: 31.2 % — ABNORMAL LOW (ref 36.0–46.0)
Hemoglobin: 9.6 g/dL — ABNORMAL LOW (ref 12.0–15.0)
MCH: 25.4 pg — ABNORMAL LOW (ref 26.0–34.0)
MCHC: 30.8 g/dL (ref 30.0–36.0)
MCV: 82.5 fL (ref 80.0–100.0)
Platelets: 287 10*3/uL (ref 150–400)
RBC: 3.78 MIL/uL — ABNORMAL LOW (ref 3.87–5.11)
RDW: 17.1 % — ABNORMAL HIGH (ref 11.5–15.5)
WBC: 12.5 10*3/uL — ABNORMAL HIGH (ref 4.0–10.5)
nRBC: 0 % (ref 0.0–0.2)

## 2021-05-22 LAB — BASIC METABOLIC PANEL
Anion gap: 9 (ref 5–15)
BUN: 26 mg/dL — ABNORMAL HIGH (ref 8–23)
CO2: 25 mmol/L (ref 22–32)
Calcium: 9.3 mg/dL (ref 8.9–10.3)
Chloride: 99 mmol/L (ref 98–111)
Creatinine, Ser: 1.27 mg/dL — ABNORMAL HIGH (ref 0.44–1.00)
GFR, Estimated: 43 mL/min — ABNORMAL LOW (ref >60)
Glucose, Bld: 209 mg/dL — ABNORMAL HIGH (ref 70–99)
Potassium: 4.5 mmol/L (ref 3.5–5.1)
Sodium: 133 mmol/L — ABNORMAL LOW (ref 135–145)

## 2021-05-22 LAB — TROPONIN I (HIGH SENSITIVITY): Troponin I (High Sensitivity): 5 ng/L (ref <18)

## 2021-05-22 LAB — PROTIME-INR
INR: 1.2 (ref 0.8–1.2)
Prothrombin Time: 14.7 seconds (ref 11.4–15.2)

## 2021-05-22 IMAGING — CR DG CHEST 2V
2 series · 2 of 2 positions shown · non-contrast
Comparison: Chest x-ray dated [DATE]

CLINICAL DATA: Shortness of breath

EXAM:
CHEST - 2 VIEW

[w chest pa]
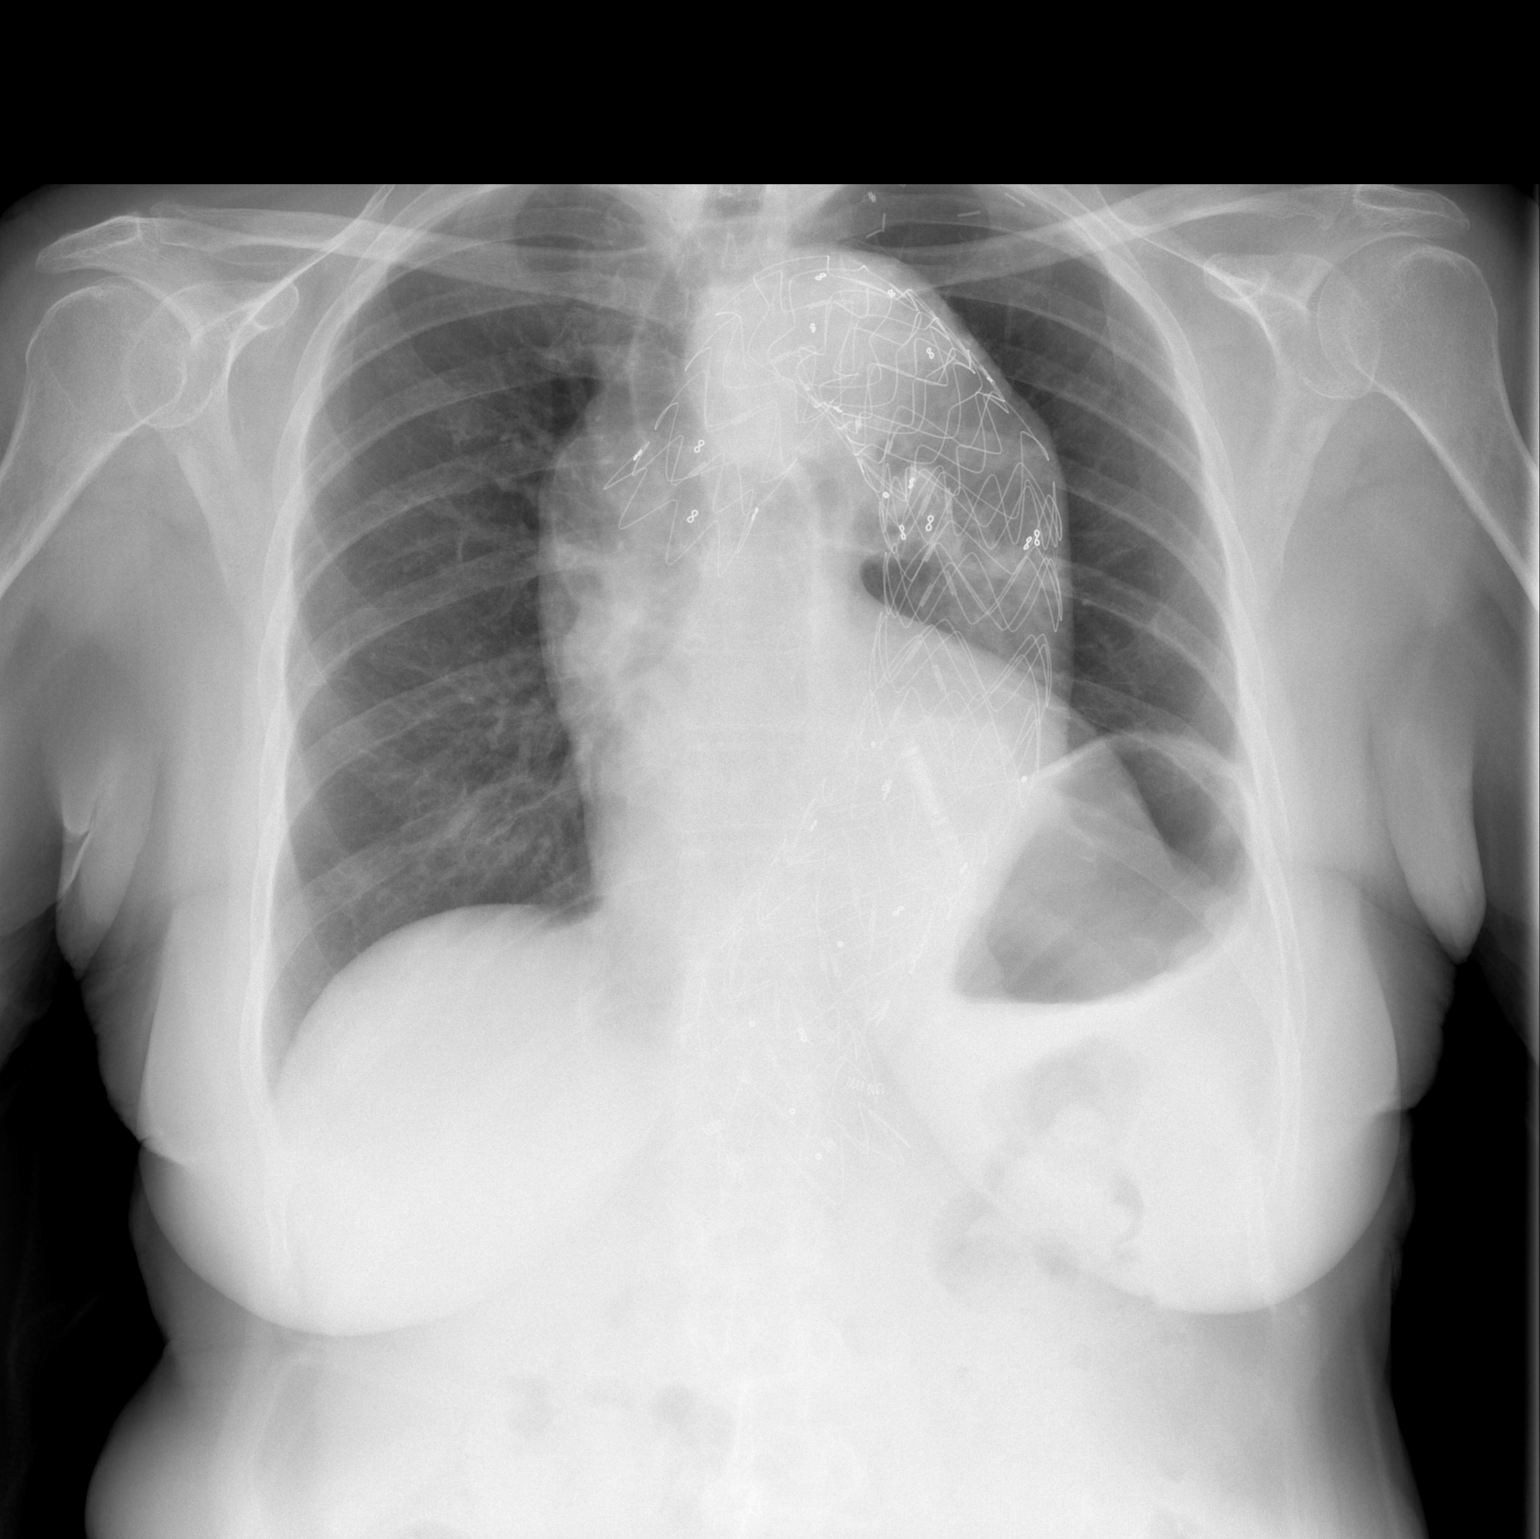

[w chest lat]
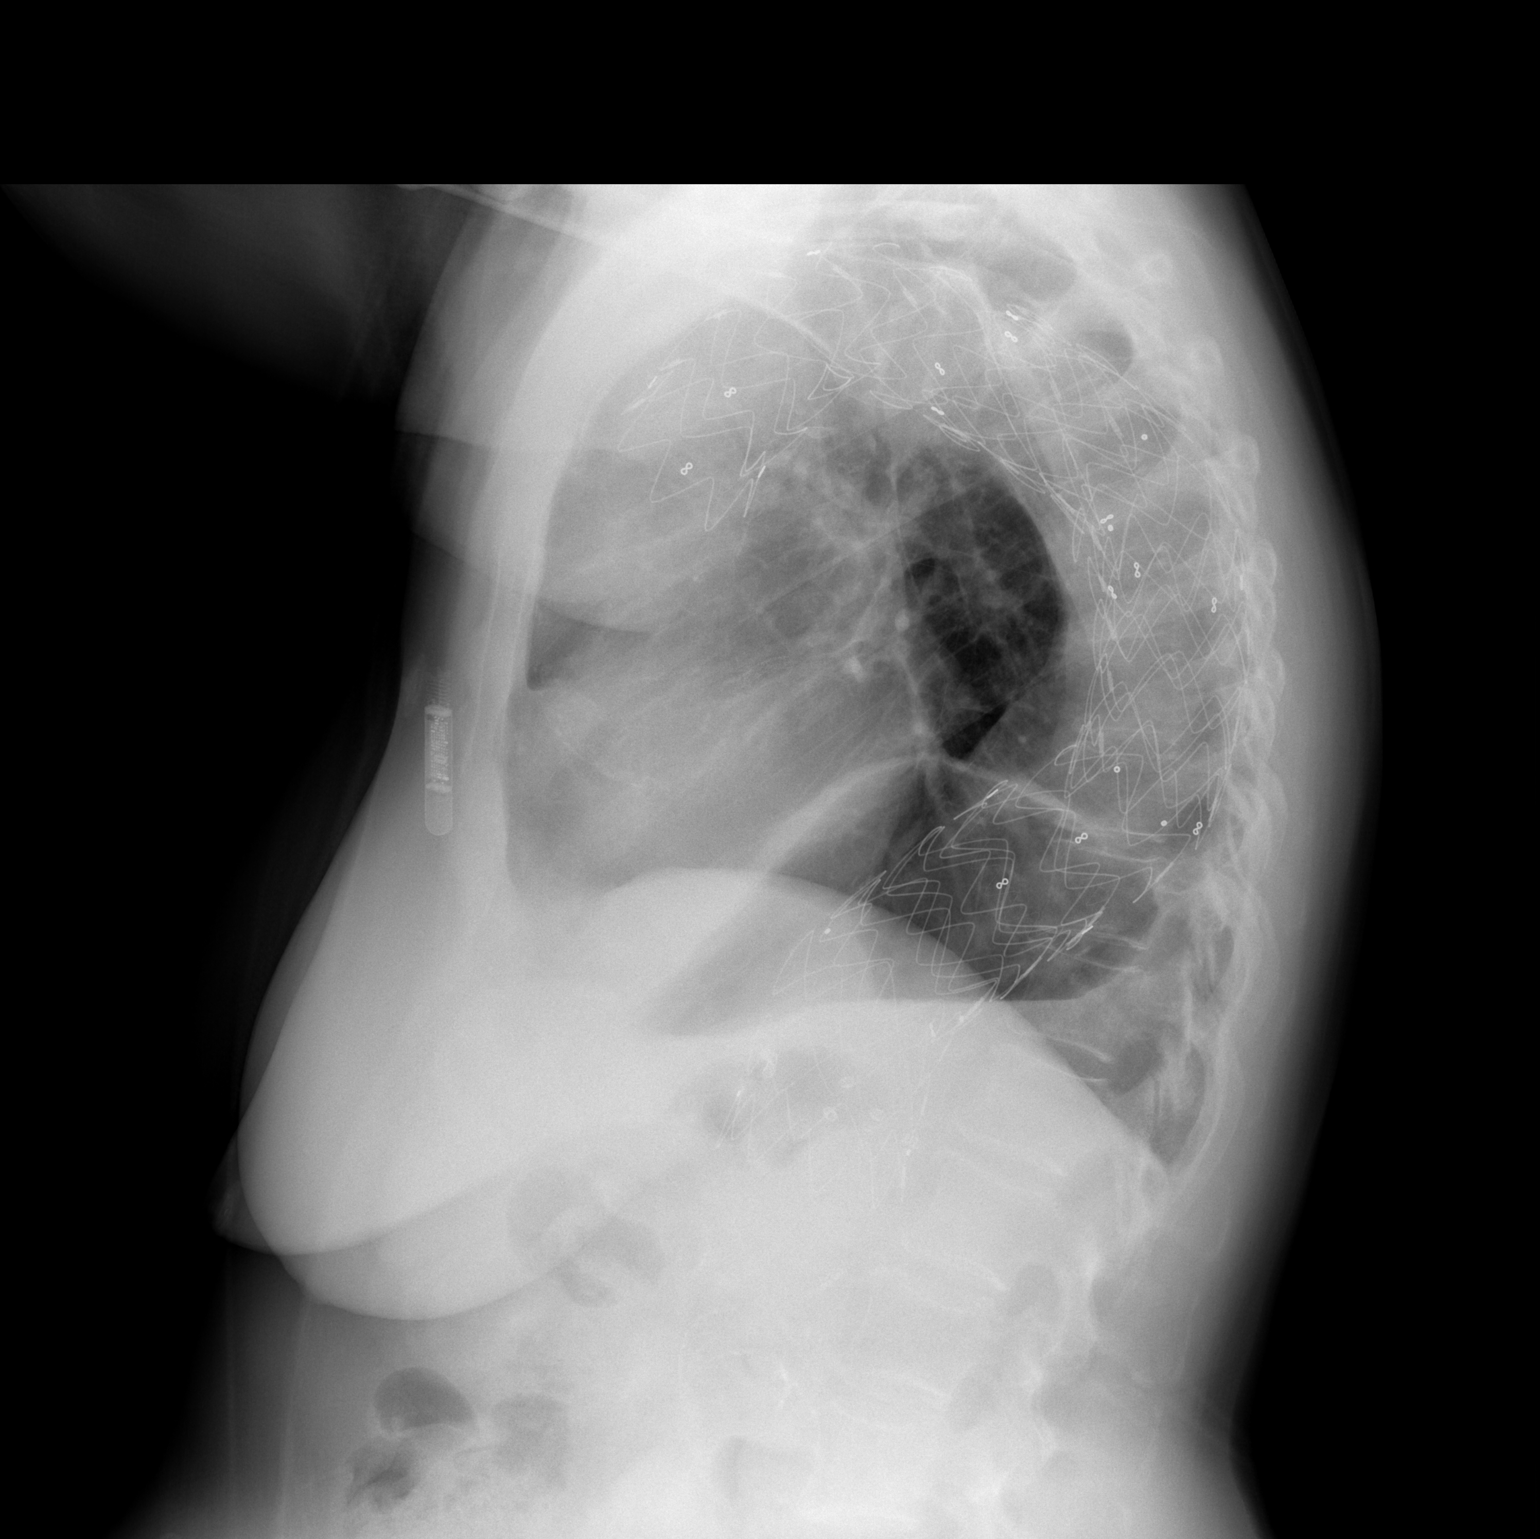

[2 of 2 positions shown; findings below may reference images not displayed]

FINDINGS: Cardiac and mediastinal contours are unchanged. Prior stent graft
repair of the aortic arch and descending thoracic aorta. Loop
recorder noted. Unchanged elevation of the left hemidiaphragm. Lungs
are clear. No pleural effusion or pneumothorax.
IMPRESSION: No active cardiopulmonary disease.

## 2021-05-22 NOTE — ED Triage Notes (Signed)
Pt c/o shortness of breath and runny nose x 2 weeks. Also has chest tightness that started tonight. States had a stroke 1 month ago.

## 2021-05-22 NOTE — ED Notes (Signed)
Pt reported to registration clerk that she was leaving  

## 2021-05-25 ENCOUNTER — Other Ambulatory Visit: Payer: Self-pay | Admitting: *Deleted

## 2021-05-25 DIAGNOSIS — I6521 Occlusion and stenosis of right carotid artery: Secondary | ICD-10-CM

## 2021-05-30 ENCOUNTER — Encounter: Payer: Medicare Other | Admitting: Vascular Surgery

## 2021-05-30 ENCOUNTER — Encounter (HOSPITAL_COMMUNITY): Payer: Self-pay

## 2021-06-08 ENCOUNTER — Inpatient Hospital Stay: Payer: Medicare Other | Admitting: Adult Health

## 2021-06-14 ENCOUNTER — Inpatient Hospital Stay (HOSPITAL_COMMUNITY)
Admission: EM | Admit: 2021-06-14 | Discharge: 2021-06-19 | DRG: 065 | Disposition: A | Discharge status: Skilled Nursing Facility | Payer: Medicare Other | Attending: Internal Medicine | Admitting: Internal Medicine

## 2021-06-14 ENCOUNTER — Emergency Department (HOSPITAL_COMMUNITY): Payer: Medicare Other

## 2021-06-14 ENCOUNTER — Encounter (HOSPITAL_COMMUNITY): Payer: Self-pay | Admitting: Internal Medicine

## 2021-06-14 ENCOUNTER — Other Ambulatory Visit: Payer: Self-pay

## 2021-06-14 DIAGNOSIS — E785 Hyperlipidemia, unspecified: Secondary | ICD-10-CM | POA: Diagnosis present

## 2021-06-14 DIAGNOSIS — Z91048 Other nonmedicinal substance allergy status: Secondary | ICD-10-CM

## 2021-06-14 DIAGNOSIS — I444 Left anterior fascicular block: Secondary | ICD-10-CM | POA: Diagnosis present

## 2021-06-14 DIAGNOSIS — Z882 Allergy status to sulfonamides status: Secondary | ICD-10-CM

## 2021-06-14 DIAGNOSIS — Z7982 Long term (current) use of aspirin: Secondary | ICD-10-CM

## 2021-06-14 DIAGNOSIS — I639 Cerebral infarction, unspecified: Secondary | ICD-10-CM

## 2021-06-14 DIAGNOSIS — Z87891 Personal history of nicotine dependence: Secondary | ICD-10-CM

## 2021-06-14 DIAGNOSIS — Z888 Allergy status to other drugs, medicaments and biological substances status: Secondary | ICD-10-CM

## 2021-06-14 DIAGNOSIS — I63511 Cerebral infarction due to unspecified occlusion or stenosis of right middle cerebral artery: Secondary | ICD-10-CM | POA: Diagnosis not present

## 2021-06-14 DIAGNOSIS — E039 Hypothyroidism, unspecified: Secondary | ICD-10-CM | POA: Diagnosis not present

## 2021-06-14 DIAGNOSIS — Z833 Family history of diabetes mellitus: Secondary | ICD-10-CM

## 2021-06-14 DIAGNOSIS — N179 Acute kidney failure, unspecified: Secondary | ICD-10-CM | POA: Diagnosis present

## 2021-06-14 DIAGNOSIS — K219 Gastro-esophageal reflux disease without esophagitis: Secondary | ICD-10-CM | POA: Diagnosis present

## 2021-06-14 DIAGNOSIS — Z8673 Personal history of transient ischemic attack (TIA), and cerebral infarction without residual deficits: Secondary | ICD-10-CM

## 2021-06-14 DIAGNOSIS — Z79890 Hormone replacement therapy (postmenopausal): Secondary | ICD-10-CM

## 2021-06-14 DIAGNOSIS — D509 Iron deficiency anemia, unspecified: Secondary | ICD-10-CM | POA: Diagnosis present

## 2021-06-14 DIAGNOSIS — E876 Hypokalemia: Secondary | ICD-10-CM | POA: Diagnosis present

## 2021-06-14 DIAGNOSIS — I2510 Atherosclerotic heart disease of native coronary artery without angina pectoris: Secondary | ICD-10-CM | POA: Diagnosis present

## 2021-06-14 DIAGNOSIS — I5032 Chronic diastolic (congestive) heart failure: Secondary | ICD-10-CM | POA: Diagnosis present

## 2021-06-14 DIAGNOSIS — I10 Essential (primary) hypertension: Secondary | ICD-10-CM

## 2021-06-14 DIAGNOSIS — R29898 Other symptoms and signs involving the musculoskeletal system: Secondary | ICD-10-CM | POA: Diagnosis not present

## 2021-06-14 DIAGNOSIS — E1159 Type 2 diabetes mellitus with other circulatory complications: Secondary | ICD-10-CM

## 2021-06-14 DIAGNOSIS — Z885 Allergy status to narcotic agent status: Secondary | ICD-10-CM

## 2021-06-14 DIAGNOSIS — Z7984 Long term (current) use of oral hypoglycemic drugs: Secondary | ICD-10-CM

## 2021-06-14 DIAGNOSIS — I110 Hypertensive heart disease with heart failure: Secondary | ICD-10-CM | POA: Diagnosis present

## 2021-06-14 DIAGNOSIS — G8194 Hemiplegia, unspecified affecting left nondominant side: Secondary | ICD-10-CM | POA: Diagnosis present

## 2021-06-14 DIAGNOSIS — Z79899 Other long term (current) drug therapy: Secondary | ICD-10-CM

## 2021-06-14 DIAGNOSIS — E1151 Type 2 diabetes mellitus with diabetic peripheral angiopathy without gangrene: Secondary | ICD-10-CM | POA: Diagnosis present

## 2021-06-14 DIAGNOSIS — R29702 NIHSS score 2: Secondary | ICD-10-CM | POA: Diagnosis present

## 2021-06-14 DIAGNOSIS — Z20822 Contact with and (suspected) exposure to covid-19: Secondary | ICD-10-CM | POA: Diagnosis present

## 2021-06-14 DIAGNOSIS — N390 Urinary tract infection, site not specified: Secondary | ICD-10-CM | POA: Diagnosis present

## 2021-06-14 DIAGNOSIS — Z91040 Latex allergy status: Secondary | ICD-10-CM

## 2021-06-14 DIAGNOSIS — D649 Anemia, unspecified: Secondary | ICD-10-CM

## 2021-06-14 DIAGNOSIS — Z7951 Long term (current) use of inhaled steroids: Secondary | ICD-10-CM

## 2021-06-14 DIAGNOSIS — Z881 Allergy status to other antibiotic agents status: Secondary | ICD-10-CM

## 2021-06-14 LAB — CBC
HCT: 27.2 % — ABNORMAL LOW (ref 36.0–46.0)
Hemoglobin: 7.8 g/dL — ABNORMAL LOW (ref 12.0–15.0)
MCH: 24.5 pg — ABNORMAL LOW (ref 26.0–34.0)
MCHC: 28.7 g/dL — ABNORMAL LOW (ref 30.0–36.0)
MCV: 85.3 fL (ref 80.0–100.0)
Platelets: 230 10*3/uL (ref 150–400)
RBC: 3.19 MIL/uL — ABNORMAL LOW (ref 3.87–5.11)
RDW: 17.8 % — ABNORMAL HIGH (ref 11.5–15.5)
WBC: 10 10*3/uL (ref 4.0–10.5)
nRBC: 0 % (ref 0.0–0.2)

## 2021-06-14 LAB — COMPREHENSIVE METABOLIC PANEL
ALT: 12 U/L (ref 0–44)
AST: 13 U/L — ABNORMAL LOW (ref 15–41)
Albumin: 2.7 g/dL — ABNORMAL LOW (ref 3.5–5.0)
Alkaline Phosphatase: 90 U/L (ref 38–126)
Anion gap: 9 (ref 5–15)
BUN: 23 mg/dL (ref 8–23)
CO2: 26 mmol/L (ref 22–32)
Calcium: 8.6 mg/dL — ABNORMAL LOW (ref 8.9–10.3)
Chloride: 101 mmol/L (ref 98–111)
Creatinine, Ser: 1.41 mg/dL — ABNORMAL HIGH (ref 0.44–1.00)
GFR, Estimated: 38 mL/min — ABNORMAL LOW (ref >60)
Glucose, Bld: 177 mg/dL — ABNORMAL HIGH (ref 70–99)
Potassium: 3.8 mmol/L (ref 3.5–5.1)
Sodium: 136 mmol/L (ref 135–145)
Total Bilirubin: 0.9 mg/dL (ref 0.3–1.2)
Total Protein: 5.9 g/dL — ABNORMAL LOW (ref 6.5–8.1)

## 2021-06-14 LAB — IRON AND TIBC
Iron: 20 ug/dL — ABNORMAL LOW (ref 28–170)
Saturation Ratios: 6 % — ABNORMAL LOW (ref 10.4–31.8)
TIBC: 337 ug/dL (ref 250–450)
UIBC: 317 ug/dL

## 2021-06-14 LAB — URINALYSIS, ROUTINE W REFLEX MICROSCOPIC
Bilirubin Urine: NEGATIVE
Glucose, UA: NEGATIVE mg/dL
Hgb urine dipstick: NEGATIVE
Ketones, ur: NEGATIVE mg/dL
Nitrite: NEGATIVE
Protein, ur: NEGATIVE mg/dL
Specific Gravity, Urine: 1.01 (ref 1.005–1.030)
WBC, UA: >50 WBC/hpf — ABNORMAL HIGH (ref 0–5)
pH: 7 (ref 5.0–8.0)

## 2021-06-14 LAB — DIFFERENTIAL
Abs Immature Granulocytes: 0.04 10*3/uL (ref 0.00–0.07)
Basophils Absolute: 0 10*3/uL (ref 0.0–0.1)
Basophils Relative: 0 %
Eosinophils Absolute: 0.2 10*3/uL (ref 0.0–0.5)
Eosinophils Relative: 2 %
Immature Granulocytes: 0 %
Lymphocytes Relative: 16 %
Lymphs Abs: 1.6 10*3/uL (ref 0.7–4.0)
Monocytes Absolute: 0.8 10*3/uL (ref 0.1–1.0)
Monocytes Relative: 8 %
Neutro Abs: 7.4 10*3/uL (ref 1.7–7.7)
Neutrophils Relative %: 74 %

## 2021-06-14 LAB — I-STAT CHEM 8, ED
BUN: 24 mg/dL — ABNORMAL HIGH (ref 8–23)
Calcium, Ion: 1.11 mmol/L — ABNORMAL LOW (ref 1.15–1.40)
Chloride: 101 mmol/L (ref 98–111)
Creatinine, Ser: 1.5 mg/dL — ABNORMAL HIGH (ref 0.44–1.00)
Glucose, Bld: 173 mg/dL — ABNORMAL HIGH (ref 70–99)
HCT: 25 % — ABNORMAL LOW (ref 36.0–46.0)
Hemoglobin: 8.5 g/dL — ABNORMAL LOW (ref 12.0–15.0)
Potassium: 3.8 mmol/L (ref 3.5–5.1)
Sodium: 138 mmol/L (ref 135–145)
TCO2: 26 mmol/L (ref 22–32)

## 2021-06-14 LAB — APTT: aPTT: 30 seconds (ref 24–36)

## 2021-06-14 LAB — RETICULOCYTES
Immature Retic Fract: 23.4 % — ABNORMAL HIGH (ref 2.3–15.9)
RBC.: 3.4 MIL/uL — ABNORMAL LOW (ref 3.87–5.11)
Retic Count, Absolute: 103.4 10*3/uL (ref 19.0–186.0)
Retic Ct Pct: 3 % (ref 0.4–3.1)

## 2021-06-14 LAB — PROTIME-INR
INR: 1.3 — ABNORMAL HIGH (ref 0.8–1.2)
Prothrombin Time: 15.9 seconds — ABNORMAL HIGH (ref 11.4–15.2)

## 2021-06-14 LAB — VITAMIN B12: Vitamin B-12: 235 pg/mL (ref 180–914)

## 2021-06-14 LAB — MAGNESIUM: Magnesium: 1.7 mg/dL (ref 1.7–2.4)

## 2021-06-14 LAB — FERRITIN: Ferritin: 47 ng/mL (ref 11–307)

## 2021-06-14 LAB — CBG MONITORING, ED: Glucose-Capillary: 85 mg/dL (ref 70–99)

## 2021-06-14 IMAGING — MR MR HEAD W/O CM
6 of 11 series · 24 of 48 positions shown · non-contrast
Comparison: None.

CLINICAL DATA: Acute neurologic deficit

EXAM:
MRI HEAD WITHOUT CONTRAST
TECHNIQUE: Multiplanar, multiecho pulse sequences of the brain and surrounding
structures were obtained without intravenous contrast.

[Series 2: DWI · axial · 3.0mm · 0.94mm/px · z∈[-94,+52]mm · 7 of 100 slices shown (1 of 2)]
[im 1/100]
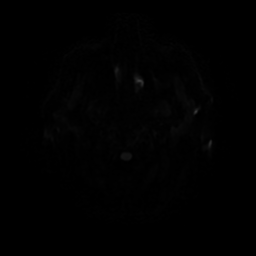
[im 17/100]
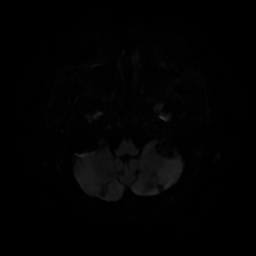
[im 34/100]
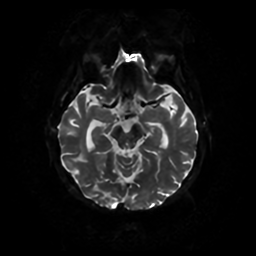
[im 50/100]
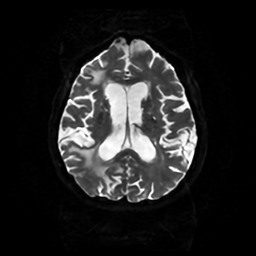
[im 67/100]
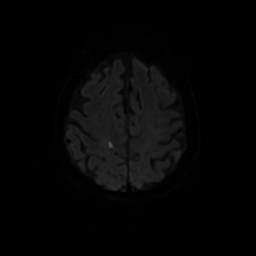
[im 83/100]
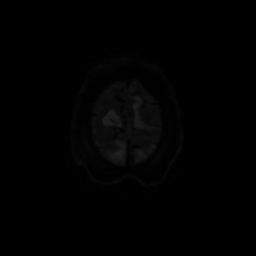
[im 100/100]
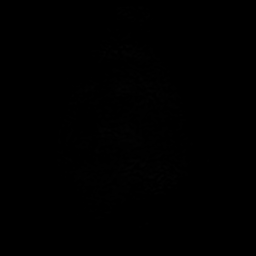

[Series 3: DWI · coronal · 4.0mm · 0.94mm/px · 5 of 72 slices shown (2 of 2)]
[im 1/72]
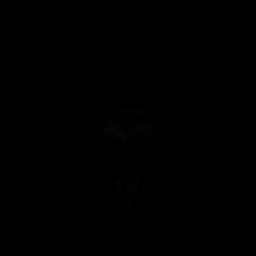
[im 18/72]
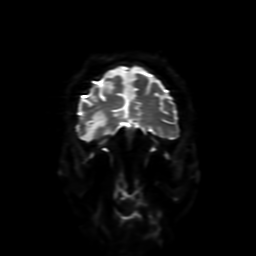
[im 36/72]
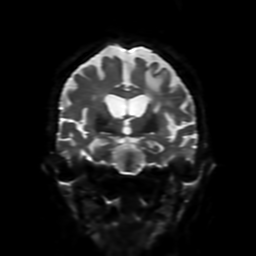
[im 54/72]
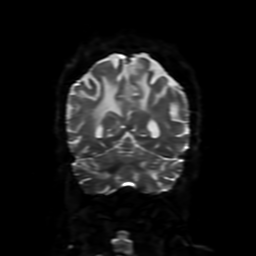
[im 72/72]
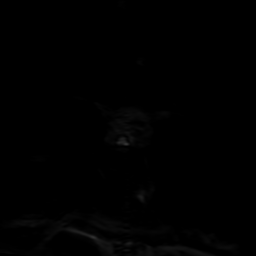

[Series 4: FLAIR · sagittal · 5.0mm · 0.23mm/px · 2 of 25 slices shown (1 of 2)]
[im 1/25]
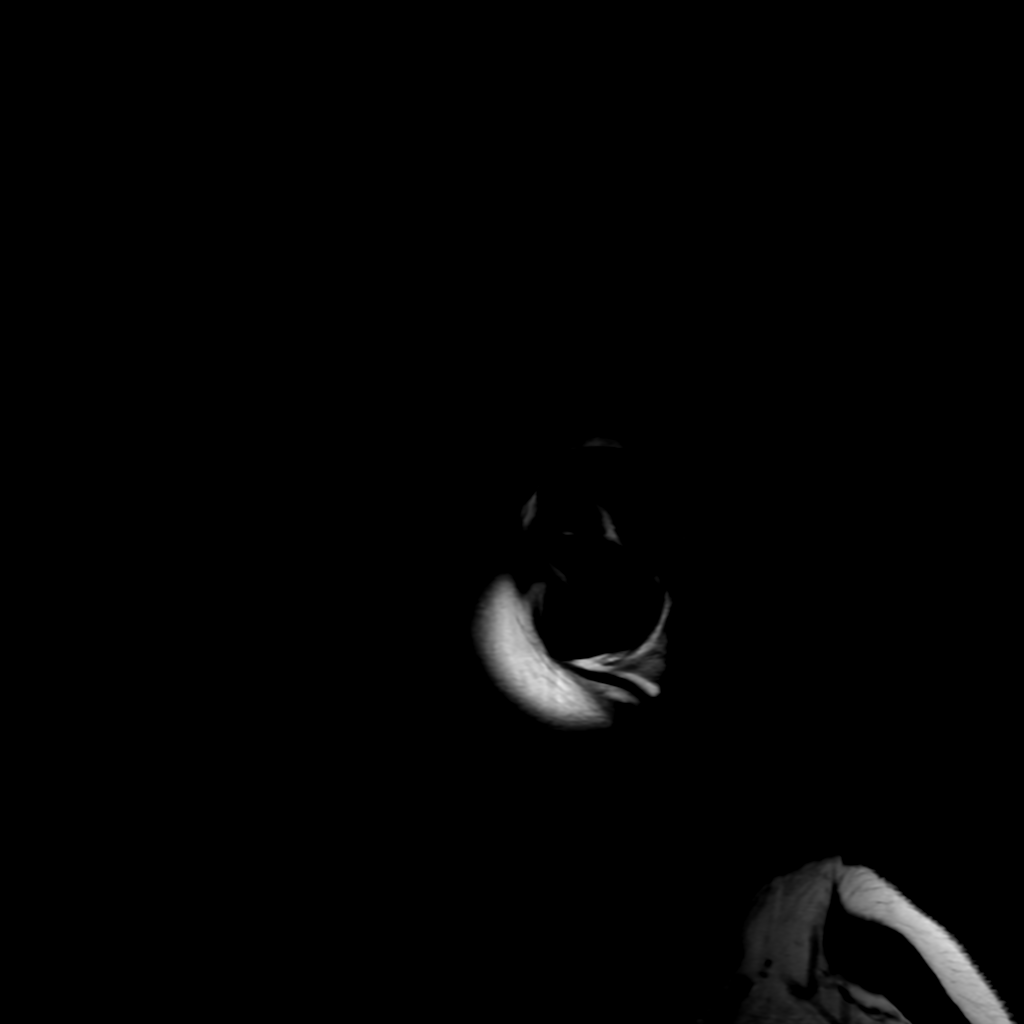
[im 25/25]
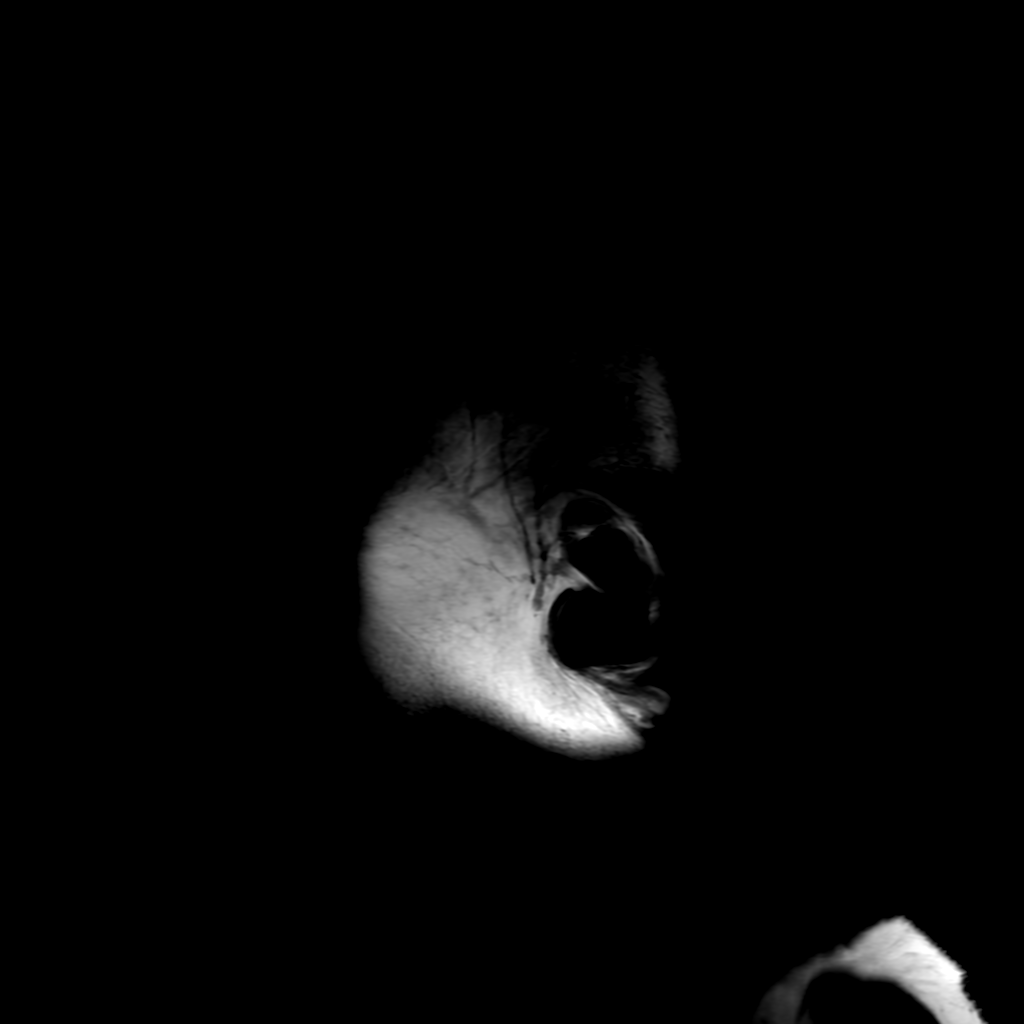

[Series 6: FLAIR · axial · 4.0mm · 0.45mm/px · z∈[-101,+48]mm · 3 of 35 slices shown (2 of 2)]
[im 1/35]
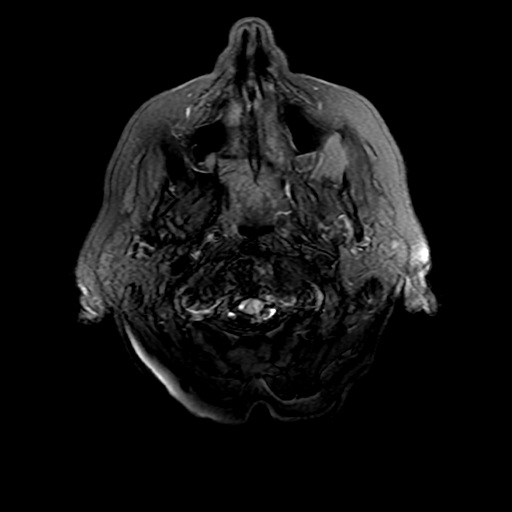
[im 18/35]
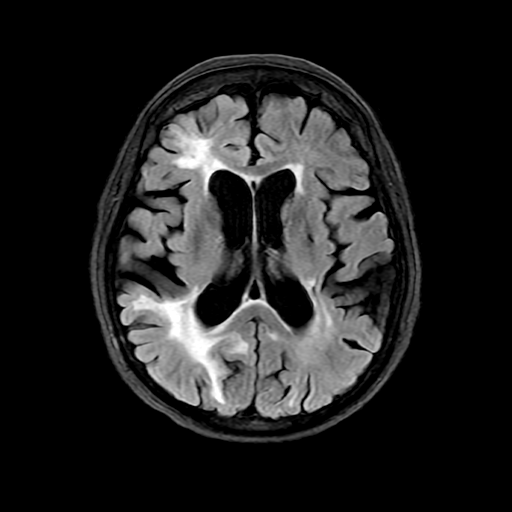
[im 35/35]
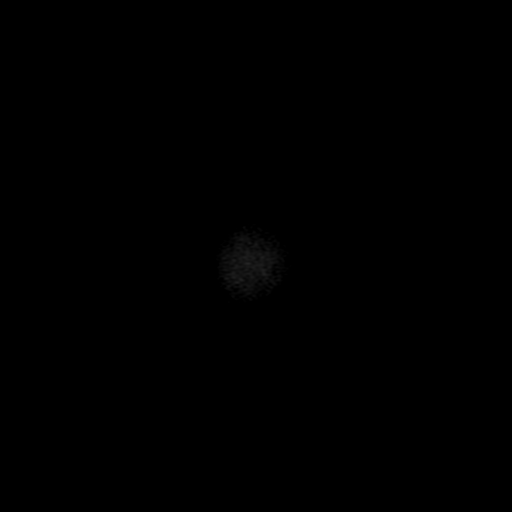

[Series 250: ADC · axial · 3.0mm · 0.94mm/px · z∈[-94,+52]mm · 4 of 50 slices shown (1 of 2)]
[im 1/50]
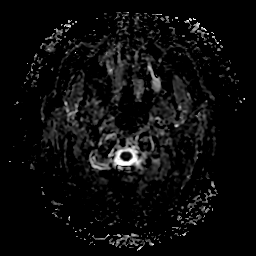
[im 17/50]
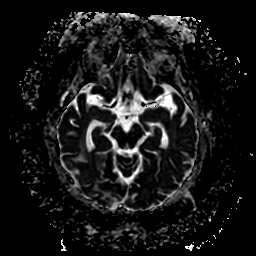
[im 33/50]
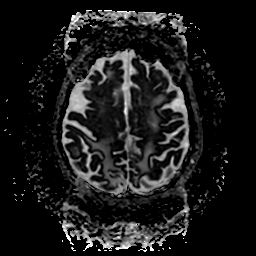
[im 50/50]
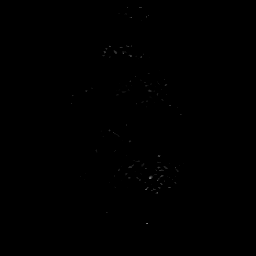

[Series 350: ADC · coronal · 4.0mm · 0.94mm/px · 3 of 35 slices shown (2 of 2)]
[im 1/35]
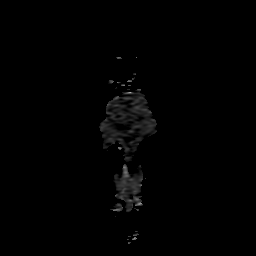
[im 18/35]
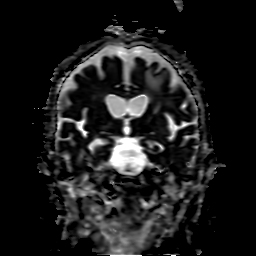
[im 35/35]
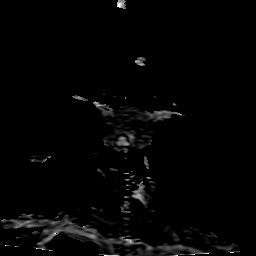

[24 of 48 positions shown; findings below may reference images not displayed]

FINDINGS: Brain: Small focus of acute ischemia within the right corona
radiata. No acute or chronic hemorrhage. Multiple old infarcts of
the cerebellum, right occipital lobe, right frontal lobe and the
corona radiata/centrum semiovale. Diffuse generalized atrophy.
Confluent white matter hyperintensity. The midline structures are
normal.

Vascular: Major flow voids are preserved.

Skull and upper cervical spine: Normal calvarium and skull base.
Visualized upper cervical spine and soft tissues are normal.

Sinuses/Orbits:No paranasal sinus fluid levels or advanced mucosal
thickening. No mastoid or middle ear effusion. Normal orbits.
IMPRESSION: 1. Small focus of acute ischemia within the right corona radiata. No
hemorrhage or mass effect.
2. Multiple old infarcts and findings of chronic ischemic
microangiopathy.
3.  Cerebral Atrophy ([9H]-[9H]).

## 2021-06-14 IMAGING — CT CT HEAD W/O CM
4 series · 17 of 47 positions shown, 19 images · non-contrast
Comparison: MRI [DATE]

CLINICAL DATA: Neuro deficit, acute, stroke suspected new onset of
left leg weakness over night.

EXAM:
CT HEAD WITHOUT CONTRAST
TECHNIQUE: Contiguous axial images were obtained from the base of the skull
through the vertex without intravenous contrast.

[Series 3: head without · axial · non-contrast · 0.39mm/px · z∈[-64,+56]mm · 7 of 32 slices shown, 9 images]
[im 4/32  brain]
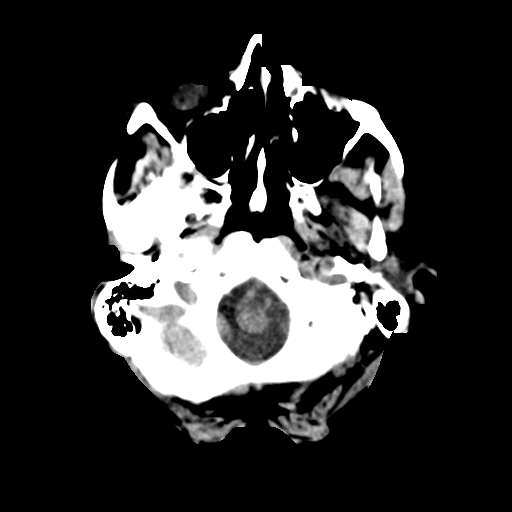
[im 4/32  bone]
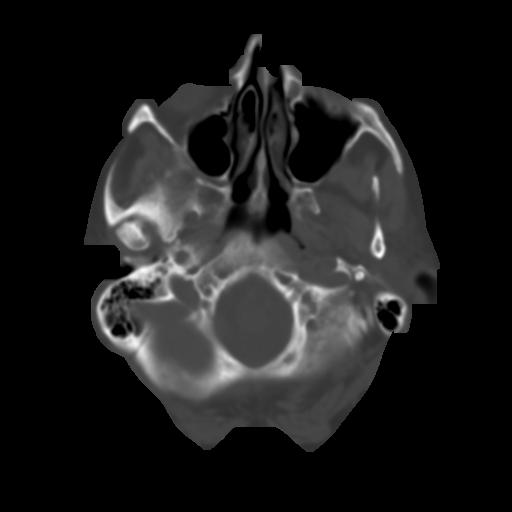
[im 8/32  brain]
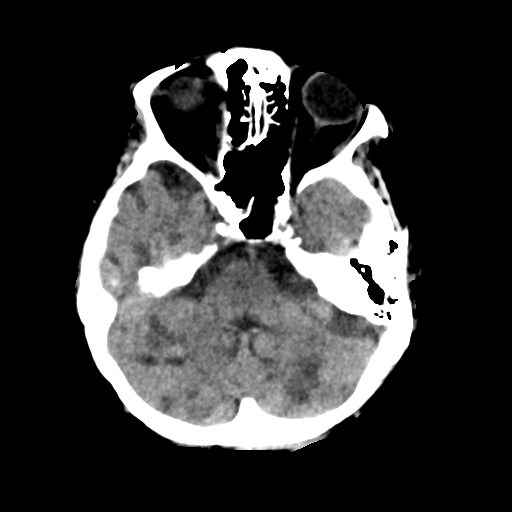
[im 12/32  brain]
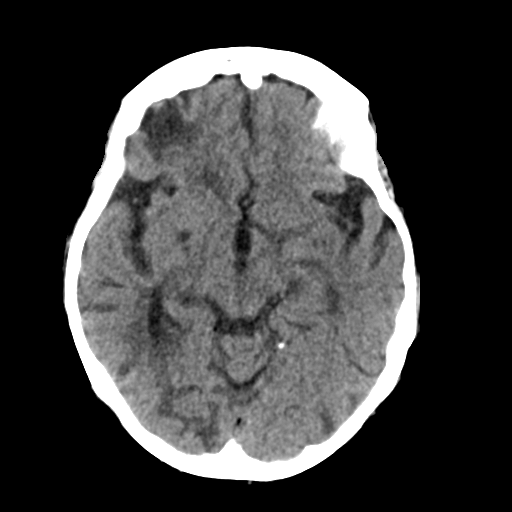
[im 16/32  brain]
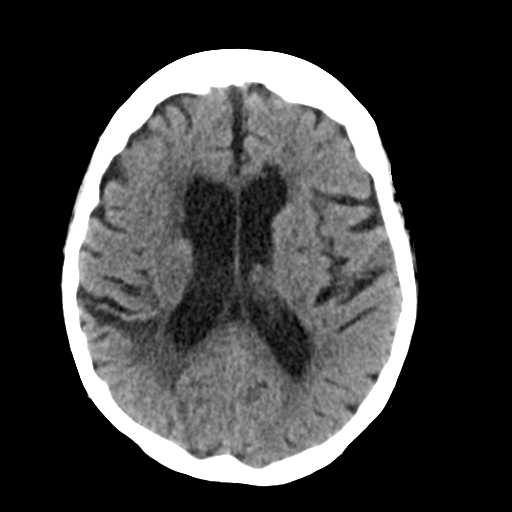
[im 20/32  brain]
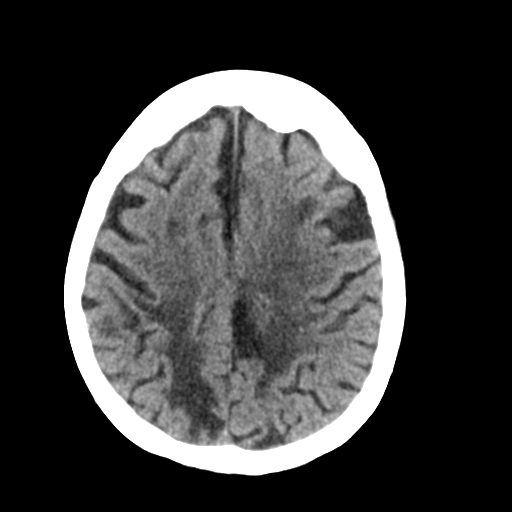
[im 20/32  bone]
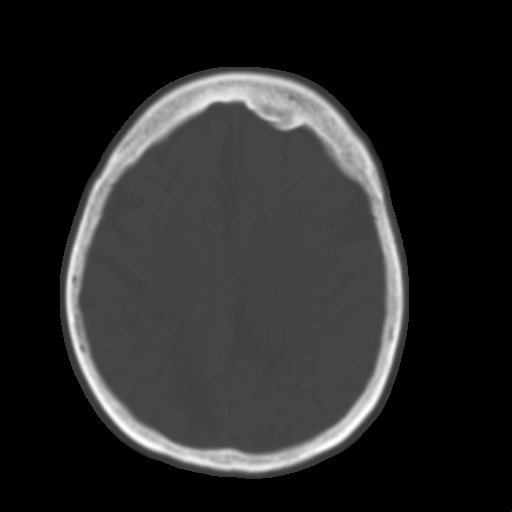
[im 24/32  brain]
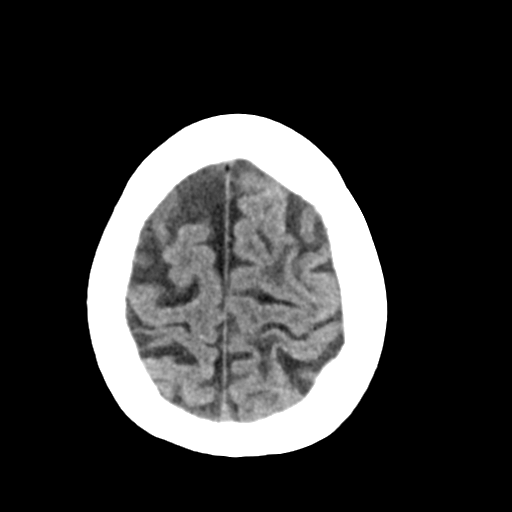
[im 28/32  brain]
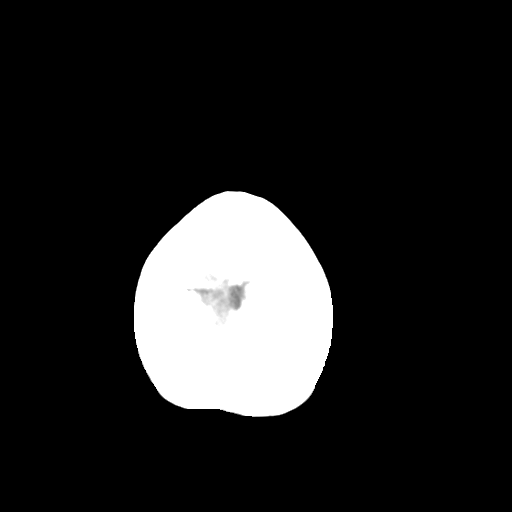

[Series 4: head bone · axial · 0.39mm/px · z∈[-65,-11]mm · 4 of 78 slices shown]
[im 8/78  bone]
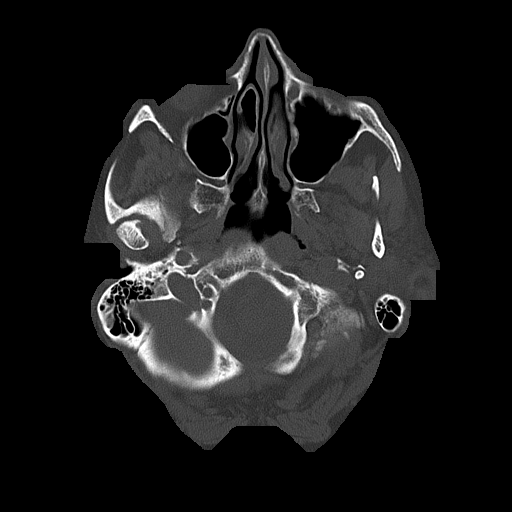
[im 16/78  bone]
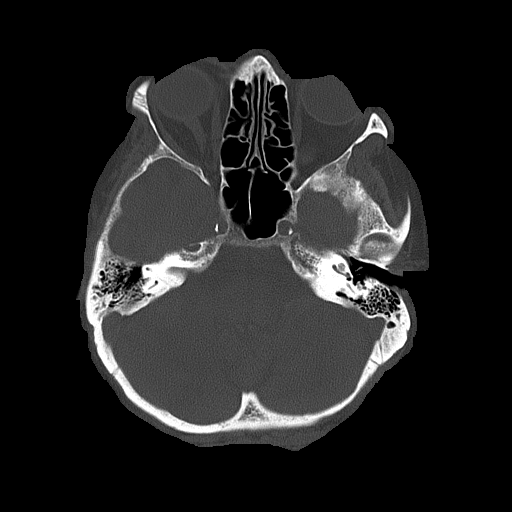
[im 24/78  bone]
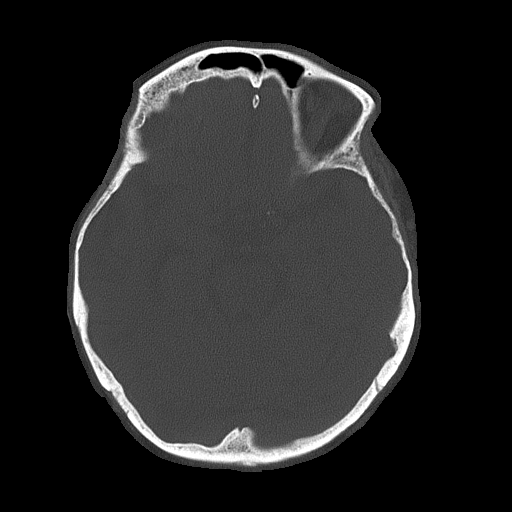
[im 35/78  bone]
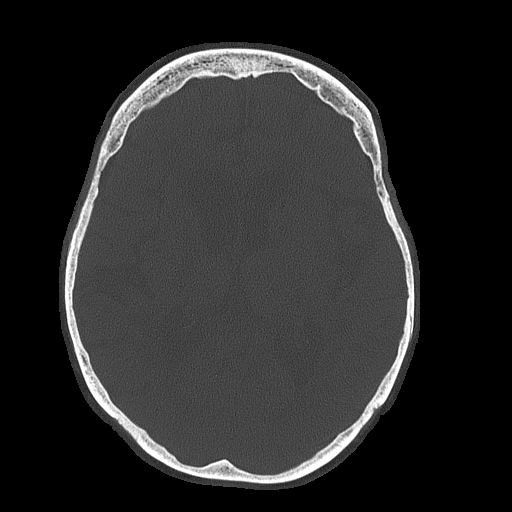

[Series 5: head without cor · coronal · non-contrast · 0.31mm/px · 3 of 62 slices shown]
[im 21/62  brain]
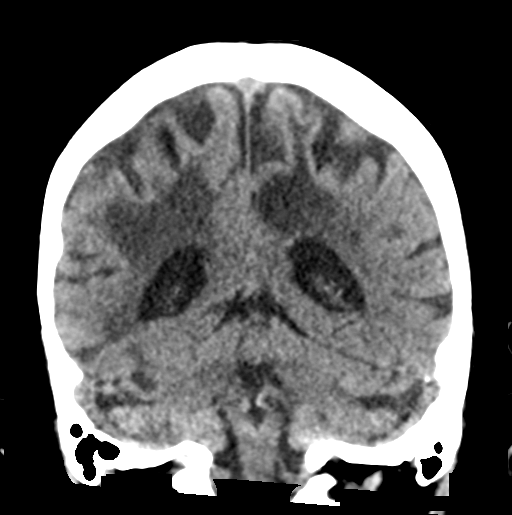
[im 28/62  brain]
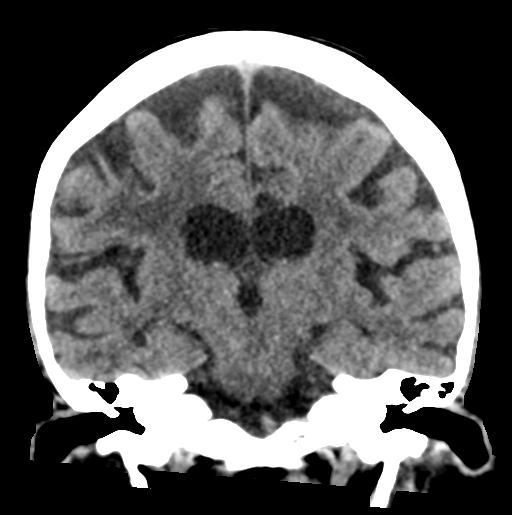
[im 34/62  brain]
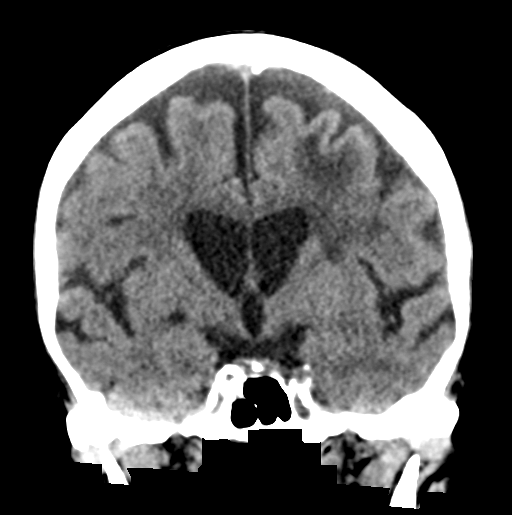

[Series 6: head without sag · sagittal · non-contrast · 0.31mm/px · 3 of 53 slices shown]
[im 18/53  brain]
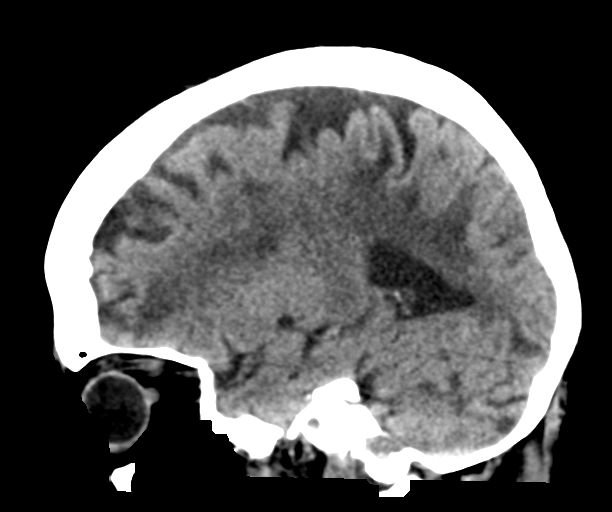
[im 27/53  brain]
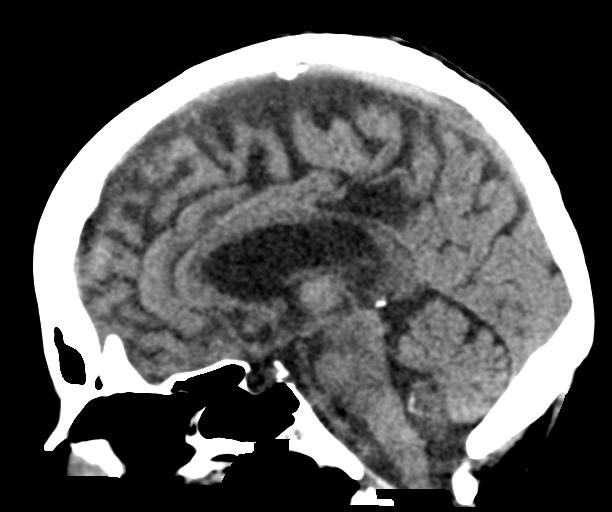
[im 35/53  brain]
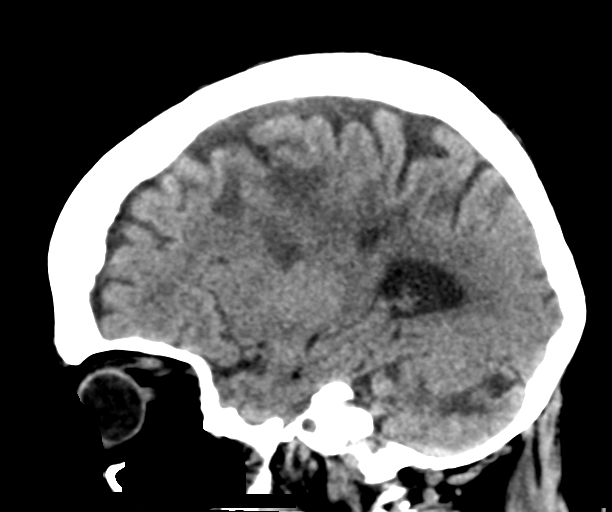

[17 of 47 positions shown; findings below may reference images not displayed]

FINDINGS: Brain: There are multiple old infarctions scattered throughout the
brain. No abnormality seen affecting the brainstem. Multiple old
bilateral cerebellar infarctions. Cerebral hemispheres show
widespread chronic small-vessel ischemic changes of the white matter
and numerous old cortical and subcortical infarctions, more numerous
in the right hemisphere than the left. None of these are
identifiably acute when compared to the studies of earlier this
year. No mass, hemorrhage, hydrocephalus or extra-axial collection.

Vascular: There is atherosclerotic calcification of the major
vessels at the base of the brain.

Skull: Negative

Sinuses/Orbits: Clear/normal

Other: None
IMPRESSION: No acute finding by CT. Extensive old ischemic changes throughout
the cerebellum and cerebral hemispheres as outlined above, without
identifiable acute infarction or hemorrhage. Certainly, a small
acute insult could be hidden within the extensive chronic changes.

## 2021-06-14 MED ORDER — STROKE: EARLY STAGES OF RECOVERY BOOK
Freq: Once | Status: AC
  Filled 2021-06-14: qty 1

## 2021-06-14 MED ORDER — ACETAMINOPHEN 325 MG PO TABS
650.0000 mg | ORAL_TABLET | Freq: Four times a day (QID) | ORAL | Status: DC | PRN
  Administered 2021-06-17: 650 mg via ORAL
  Filled 2021-06-14: qty 2

## 2021-06-14 MED ORDER — INSULIN ASPART 100 UNIT/ML IJ SOLN
0.0000 [IU] | Freq: Three times a day (TID) | INTRAMUSCULAR | Status: DC
  Administered 2021-06-15: 09:00:00 3 [IU] via SUBCUTANEOUS
  Administered 2021-06-16 – 2021-06-17 (×3): 2 [IU] via SUBCUTANEOUS
  Administered 2021-06-17 – 2021-06-18 (×3): 1 [IU] via SUBCUTANEOUS
  Administered 2021-06-18: 2 [IU] via SUBCUTANEOUS
  Administered 2021-06-19: 1 [IU] via SUBCUTANEOUS

## 2021-06-14 MED ORDER — ACETAMINOPHEN 650 MG RE SUPP: 650.0000 mg | Freq: Four times a day (QID) | RECTAL | Status: DC | PRN

## 2021-06-14 NOTE — Consult Note (Signed)
NEURO HOSPITALIST CONSULT NOTE   Requestig physician: Dr. Jacqulyn Bath  Reason for Consult: LLE weakness  History obtained from:  Patient and Chart     HPI:                                                                                                                                          Erin Baker is an 80 y.o. female with a PMHx of aortic aneurysm, DM, HTN, multiple prior strokes, right transcarotid artery revascularization in September of this year and CAD who presents to the hospital with new onset of LLE weakness. LKN was at midnight. She woke up at about 5 AM with limited mobility and weakness of her LLE which was noticed when as she walked to the bathroom at her rehab center.  She felt as though her leg was dragging.. She has chronic stroke-related deficits of left hand numbness and slurred speech. She is ambulatory at baseline. Home medications include ASA and atorvastatin. Was also on Plavix until about 2 months ago.   MRI brain revealed a small acute infarction within the right corona radiata.   Past Medical History:  Diagnosis Date   Aortic aneurysm (HCC)    Diabetes mellitus without complication (HCC)    Hypertension    Stroke [facility])     Past Surgical History:  Procedure Laterality Date   APPENDECTOMY     CARDIAC CATHETERIZATION     COLON SURGERY     CORONARY ANGIOPLASTY     TRANSCAROTID ARTERY REVASCULARIZATION  Right 04/12/2021   Procedure: RIGHT TRANSCAROTID ARTERY REVASCULARIZATION;  Surgeon: Cephus Shelling, MD;  Location: [facility] OR;  Service: Vascular;  Laterality: Right;   ULTRASOUND GUIDANCE FOR VASCULAR ACCESS Left 04/12/2021   Procedure: ULTRASOUND GUIDANCE FOR VASCULAR ACCESS, LEFT FEMORAL VEIN;  Surgeon: Cephus Shelling, MD;  Location: [facility] OR;  Service: Vascular;  Laterality: Left;    Family History  Problem Relation Age of Onset   Diabetes type II Granddaughter    CAD Neg Hx             Social History:  reports that she has quit  smoking. Her smoking use included cigarettes. She has never been exposed to tobacco smoke. She has never used smokeless tobacco. She reports that she does not drink alcohol and does not use drugs.  Allergies  Allergen Reactions   Oxycodone-Acetaminophen Nausea And Vomiting and Shortness Of Breath   Macrobid [Nitrofurantoin] Swelling   Morphine Other (See Comments)    Hyper, Confusion   Neurontin [Gabapentin]    Sulfa Antibiotics Swelling   Tape     Adhesive   Tramadol Other (See Comments)    hyper   Latex Rash   Levofloxacin Swelling and Rash    HOME MEDICATIONS:  No current facility-administered medications on file prior to encounter.   Current Outpatient Medications on File Prior to Encounter  Medication Sig Dispense Refill   aspirin 81 MG chewable tablet Chew 81 mg by mouth daily.     atorvastatin (LIPITOR) 40 MG tablet Take 40 mg by mouth daily.     calcium carbonate (TUMS - DOSED IN MG ELEMENTAL CALCIUM) 500 MG chewable tablet Chew 2 tablets by mouth every 4 (four) hours as needed for indigestion or heartburn.     cholecalciferol (VITAMIN D3) 25 MCG (1000 UNIT) tablet Take 1,000 Units by mouth daily.     diphenhydrAMINE (BENADRYL) 25 MG tablet Take 50 mg by mouth at bedtime as needed for sleep.     docusate sodium (COLACE) 100 MG capsule Take 100 mg by mouth daily.     famotidine (PEPCID) 20 MG tablet Take 20 mg by mouth daily as needed for heartburn or indigestion.     ferrous sulfate 325 (65 FE) MG EC tablet Take 325 mg by mouth daily with breakfast.     furosemide (LASIX) 20 MG tablet Take 20 mg by mouth 3 (three) times a week. Take on MWF     glipiZIDE (GLUCOTROL XL) 10 MG 24 hr tablet Take 10 mg by mouth daily with breakfast.     insulin detemir (LEVEMIR FLEXTOUCH) 100 UNIT/ML FlexPen Inject 27 Units into the skin at bedtime.     levothyroxine (SYNTHROID)  100 MCG tablet Take 100 mcg by mouth daily.     metoprolol tartrate (LOPRESSOR) 25 MG tablet Take 25 mg by mouth 2 (two) times daily.     nitroGLYCERIN (NITROSTAT) 0.4 MG SL tablet Place 0.4 mg under the tongue every 5 (five) minutes as needed for chest pain.     pantoprazole (PROTONIX) 40 MG tablet Take 1 tablet by mouth 2 (two) times daily.     sertraline (ZOLOFT) 50 MG tablet Take 50 mg by mouth daily.     umeclidinium bromide (INCRUSE ELLIPTA) 62.5 MCG/ACT AEPB Inhale 1 puff into the lungs daily.     Vitamin D, Ergocalciferol, (DRISDOL) 1.25 MG (50000 UNIT) CAPS capsule Take 50,000 Units by mouth every 7 (seven) days. Every Friday      ROS:                                                                                                                                       Denies any vision changes, confusion, dysphasia, or arm weakness. Other ROS as per HPI.    Blood pressure (!) 154/65, pulse 62, temperature 97.9 F (36.6 C), temperature source Oral, resp. rate 18, height 5' 5.25" (1.657 m), weight 73.5 kg, SpO2 100 %.   General Examination:  Physical Exam  HEENT-  [place]/AT    Lungs- Respirations unlabored Extremities- Warm and well perfused.   Neurological Examination Mental Status: Awake and alert. Oriented x 5. Speech fluent with intact naming and comprehension. Occasional subtle dysarthria is noted. Good insight. Pleasant and cooperative.  Cranial Nerves: II: Temporal visual fields intact with no extinction to DSS. PERRL.   III,IV, VI: No ptosis. EOMI. No nystagmus.  V,VII: Smile symmetric, facial temp sensation equal bilaterally VIII: hearing intact to voice IX,X: No hypophonia XI: Symmetric XII: Midline tongue extension Motor: BUE 4+/5 proximally and distally. No pronator drift.  RLE 4+/5 proximally and distally LLE 2/5 HF, 3/5 KE and KF, 4-/5 ADF/APF Sensory: FT intact x  4. Temp sensation equal to BLE. Temp sensation decreased to LUE. No extinction to DSS.  Deep Tendon Reflexes: Mildly hyperactive left sided reflexes, including low amplitude patellar with crossed adductor response. Toes downgoing bilaterally  Cerebellar: No ataxia with FNF bilaterally  Gait: Deferred   Lab Results: Basic Metabolic Panel: Recent Labs  Lab 06/14/21 1529 06/14/21 1545  NA 136 138  K 3.8 3.8  CL 101 101  CO2 26  --   GLUCOSE 177* 173*  BUN 23 24*  CREATININE 1.41* 1.50*  CALCIUM 8.6*  --     CBC: Recent Labs  Lab 06/14/21 1529 06/14/21 1545  WBC 10.0  --   NEUTROABS 7.4  --   HGB 7.8* 8.5*  HCT 27.2* 25.0*  MCV 85.3  --   PLT 230  --     Cardiac Enzymes: No results for input(s): CKTOTAL, CKMB, CKMBINDEX, TROPONINI in the last 168 hours.  Lipid Panel: No results for input(s): CHOL, TRIG, HDL, CHOLHDL, VLDL, LDLCALC in the last 168 hours.  Imaging: CT HEAD WO CONTRAST  Result Date: 06/14/2021 CLINICAL DATA:  Neuro deficit, acute, stroke suspected new onset of left leg weakness over night. EXAM: CT HEAD WITHOUT CONTRAST TECHNIQUE: Contiguous axial images were obtained from the base of the skull through the vertex without intravenous contrast. COMPARISON:  MRI 04/05/2021 FINDINGS: Brain: There are multiple old infarctions scattered throughout the brain. No abnormality seen affecting the brainstem. Multiple old bilateral cerebellar infarctions. Cerebral hemispheres show widespread chronic small-vessel ischemic changes of the white matter and numerous old cortical and subcortical infarctions, more numerous in the right hemisphere than the left. None of these are identifiably acute when compared to the studies of earlier this year. No mass, hemorrhage, hydrocephalus or extra-axial collection. Vascular: There is atherosclerotic calcification of the major vessels at the base of the brain. Skull: Negative Sinuses/Orbits: Clear/normal Other: None IMPRESSION: No acute  finding by CT. Extensive old ischemic changes throughout the cerebellum and cerebral hemispheres as outlined above, without identifiable acute infarction or hemorrhage. Certainly, a small acute insult could be hidden within the extensive chronic changes. Electronically Signed   By: Paulina Fusi M.D.   On: 06/14/2021 16:16   MR BRAIN WO CONTRAST  Result Date: 06/14/2021 CLINICAL DATA:  Acute neurologic deficit EXAM: MRI HEAD WITHOUT CONTRAST TECHNIQUE: Multiplanar, multiecho pulse sequences of the brain and surrounding structures were obtained without intravenous contrast. COMPARISON:  None. FINDINGS: Brain: Small focus of acute ischemia within the right corona radiata. No acute or chronic hemorrhage. Multiple old infarcts of the cerebellum, right occipital lobe, right frontal lobe and the corona radiata/centrum semiovale. Diffuse generalized atrophy. Confluent white matter hyperintensity. The midline structures are normal. Vascular: Major flow voids are preserved. Skull and upper cervical spine: Normal calvarium and skull base. Visualized upper  cervical spine and soft tissues are normal. Sinuses/Orbits:No paranasal sinus fluid levels or advanced mucosal thickening. No mastoid or middle ear effusion. Normal orbits. IMPRESSION: 1. Small focus of acute ischemia within the right corona radiata. No hemorrhage or mass effect. 2. Multiple old infarcts and findings of chronic ischemic microangiopathy. 3.  Cerebral Atrophy (ICD10-G31.9). Electronically Signed   By: Deatra Robinson M.D.   On: 06/14/2021 20:22     Assessment: 80 year old female presenting with new onset of LLE weakness. MRI brain reveals a small acute infarction within the right corona radiata.  1. Exam reveals LLE weakness and left sided mild hyperreflexia.  2. MRI brain: Small focus of acute ischemia within the right corona radiata. No hemorrhage or mass effect. Multiple old infarcts and findings of chronic ischemic microangiopathy. Cerebral  Atrophy 3. Stroke risk factors: DM, HTN, multiple prior strokes, carotid artery disease and CAD   Recommendations: 1. HgbA1c, fasting lipid panel 2. CTA of head and neck 3. TTE 4. PT consult, OT consult, Speech consult 5. Add Plavix to ASA as she has failed monotherapy 6. Risk factor modification 7. Telemetry monitoring 8. Frequent neuro checks 9. NPO until passes stroke swallow screen 10. BP management     Electronically signed: Dr. Caryl Pina 06/14/2021, 9:12 PM

## 2021-06-14 NOTE — ED Triage Notes (Signed)
Pt from PCP/rehab via [facility] for eval of new onset L leg weakness. Last normal at midnight, woke at approx 5am, limited mobility w L leg.  Hx multiple strokes, on plavix for same until recently (approx 95mos), stroke deficits of L hand numbness, slurred speech. No falls, usually ambulatory no assist.  136/52 CBG 175  HR 74  No headache, no blurred vision, no drift/droop noted

## 2021-06-14 NOTE — H&P (Addendum)
2-day History and Physical    PLEASE NOTE THAT DRAGON DICTATION SOFTWARE WAS USED IN THE CONSTRUCTION OF THIS NOTE.   Erin Baker HWE:[id number] DOB: [birth year] DOA: 06/14/2021  PCP: Clemencia Course, PA-C  Patient coming from: home   I have personally briefly reviewed patient's old medical records in [facility]  Chief Complaint: left lower extremity weakness  HPI: Erin Baker is a 80 y.o. female with medical history significant for multiple previous acute ischemic CVAs, essential hypertension, acquired hypothyroidism, chronic iron deficiency anemia with baseline hemoglobin 8.5-10, chronic diastolic heart failure, who is admitted to [facility] on 06/14/2021 with acute ischemic CVA after presenting from home to [facility] ED complaining of left lower extremity weakness.   The patient reports that she went to sleep between 20 200-20 300 stressor started on 06/13/2021 at which time she was without acute focal neurologic deficit.  She woke around 5 AM on 06/14/2021, at which time she noted new onset left lower extremity weakness.  Otherwise, she denied any new focal neurologic deficits, including no new numbness, paresthesias, dysphagia, dizziness, vertigo, nausea, vomiting, change in vision, blurry vision, diplopia, word finding difficulties, slurring of speech, facial droop, or headache. Denies any associated chest pain, shortness of breath, palpitations, diaphoresis, presyncope, or syncope. She notes that her new onset left lower extremity weakness is persisted throughout today, ultimately prompting her to present to [facility], ED for further evaluation.   She has a history of multiple prior acute ischemic CVAs, including hospitalization at [facility] during the first week of September 2022, at which time MRI brain showed small acute right frontal infarct.   Has a history of essential hypertension, for which he is on Lopressor.  While she denies any formal underlying history of  hyperlipidemia, she acknowledges that she was prescribed atorvastatin 40 mg p.o. daily as a component of previous medical management of acute ischemic CVA.  She also acknowledges history of type 2 diabetes mellitus for which she is on Levemir as well as glipizide.  Most recent hemoglobin A1c noted to be 7.7% when checked on 04/05/2021 as component of evaluation of aforementioned previous acute ischemic CVA.  Denies any known history of underlying atrial fibrillation or obstructive sleep apnea.  She currently takes no daily baby aspirin, but otherwise on no blood thinners or formal anticoagulation.  She confirms that she is a former smoker.  Medical history also notable for chronic diastolic heart failure, with echocardiogram on 04/05/2021, which was performed as component of acute ischemic CVA work-up at that time, demonstrating LVEF 66 5%, no focal wall motion abnormalities, will showing grade 1 diastolic dysfunction in the absence of any significant valvular pathology.  She also has a history of chronic iron deficiency anemia with apparent baseline hemoglobin 8.5-10, with most recent previous hemoglobin data points noted to be as follows: Hemoglobin 8.5 on 05/31/21, 9.6 on 05/22/2021, and 9.5 on 04/13/2021.      ED Course:  Vital signs in the ED were notable for the following: Afebrile, heart rate 58-67, pressure 117/51 -154/57; respiratory 17-21, oxygen saturation 96 to 100% on room air  Labs were notable for the following: CMP notable for creatinine 1.41, glucose 177.  Urinalysis showed greater than 50 white blood cells, few bacteria.  CBC showed hemoglobin 7.8, platelets 230.  COVID-19 screen performed in the ED today currently pending.  Imaging and additional notable ED work-up: EKG in comparison to most recent prior from 05/22/2021 showed sinus rhythm with heart rate 59, left anterior  fascicular block, no evidence of  new T wave or ST changes.  CT head showed no evidence of acute intracranial process.   MRI brain demonstrated small focus of acute ischemia within the left corona radiata, without associated hemorrhage or mass-effect.   EDP discussed patient's case and imaging with on-call neurologist, Dr.Lindzen, who recommended overnight observation to hospitalist service for further evaluation management of presenting acute ischemic CVA, with additional recommendations to follow.    Review of Systems: As per HPI otherwise 10 point review of systems negative.   Past Medical History:  Diagnosis Date   Aortic aneurysm ([place])    Diabetes mellitus without complication ([place])    Hypertension    Stroke [facility])     Past Surgical History:  Procedure Laterality Date   APPENDECTOMY     CARDIAC CATHETERIZATION     COLON SURGERY     CORONARY ANGIOPLASTY     TRANSCAROTID ARTERY REVASCULARIZATION  Right 04/12/2021   Procedure: RIGHT TRANSCAROTID ARTERY REVASCULARIZATION;  Surgeon: Marty Heck, MD;  Location: [place];  Service: Vascular;  Laterality: Right;   ULTRASOUND GUIDANCE FOR VASCULAR ACCESS Left 04/12/2021   Procedure: ULTRASOUND GUIDANCE FOR VASCULAR ACCESS, LEFT FEMORAL VEIN;  Surgeon: Marty Heck, MD;  Location: [place];  Service: Vascular;  Laterality: Left;    Social History:  reports that she has quit smoking. Her smoking use included cigarettes. She has never been exposed to tobacco smoke. She has never used smokeless tobacco. She reports that she does not drink alcohol and does not use drugs.   Allergies  Allergen Reactions   Oxycodone-Acetaminophen Nausea And Vomiting and Shortness Of Breath   Macrobid [Nitrofurantoin] Swelling   Morphine Other (See Comments)    Hyper, Confusion   Neurontin [Gabapentin]    Sulfa Antibiotics Swelling   Tape     Adhesive   Tramadol Other (See Comments)    hyper   Latex Rash   Levofloxacin Swelling and Rash    Family History  Problem Relation Age of Onset   Diabetes type II Granddaughter    CAD Neg Hx     Family history  reviewed and not pertinent    Prior to Admission medications   Medication Sig Start Date End Date Taking? Authorizing Provider  aspirin 81 MG chewable tablet Chew 81 mg by mouth daily. 08/05/18  Yes [provider]  atorvastatin (LIPITOR) 40 MG tablet Take 40 mg by mouth daily.   Yes [provider]  calcium carbonate (TUMS - DOSED IN MG ELEMENTAL CALCIUM) 500 MG chewable tablet Chew 2 tablets by mouth every 4 (four) hours as needed for indigestion or heartburn.   Yes [provider]  cholecalciferol (VITAMIN D3) 25 MCG (1000 UNIT) tablet Take 1,000 Units by mouth daily.   Yes [provider]  diphenhydrAMINE (BENADRYL) 25 MG tablet Take 50 mg by mouth at bedtime as needed for sleep.   Yes [provider]  docusate sodium (COLACE) 100 MG capsule Take 100 mg by mouth daily.   Yes [provider]  famotidine (PEPCID) 20 MG tablet Take 20 mg by mouth daily as needed for heartburn or indigestion.   Yes [provider]  ferrous sulfate 325 (65 FE) MG EC tablet Take 325 mg by mouth daily with breakfast.   Yes [provider]  furosemide (LASIX) 20 MG tablet Take 20 mg by mouth 3 (three) times a week. Take on MWF 11/24/19  Yes [provider]  glipiZIDE (GLUCOTROL XL) 10 MG  24 hr tablet Take 10 mg by mouth daily with breakfast.   Yes [provider]  insulin detemir (LEVEMIR FLEXTOUCH) 100 UNIT/ML FlexPen Inject 27 Units into the skin at bedtime. 11/03/20  Yes [provider]  levothyroxine (SYNTHROID) 100 MCG tablet Take 100 mcg by mouth daily. 12/22/19 02/23/22 Yes [provider]  metoprolol tartrate (LOPRESSOR) 25 MG tablet Take 25 mg by mouth 2 (two) times daily. 01/02/21  Yes [provider]  nitroGLYCERIN (NITROSTAT) 0.4 MG SL tablet Place 0.4 mg under the tongue every 5 (five) minutes as needed for chest pain.   Yes [provider]  pantoprazole (PROTONIX) 40 MG tablet Take 1  tablet by mouth 2 (two) times daily. 02/08/20  Yes [provider]  sertraline (ZOLOFT) 50 MG tablet Take 50 mg by mouth daily. 08/02/20  Yes [provider]  umeclidinium bromide (INCRUSE ELLIPTA) 62.5 MCG/ACT AEPB Inhale 1 puff into the lungs daily.   Yes [provider]  Vitamin D, Ergocalciferol, (DRISDOL) 1.25 MG (50000 UNIT) CAPS capsule Take 50,000 Units by mouth every 7 (seven) days. Every Friday   Yes [provider]     Objective    Physical Exam: Vitals:   06/14/21 1822 06/14/21 1857 06/14/21 1915 06/14/21 2054  BP: 121/63 (!) 154/57 (!) 133/54 (!) 154/65  Pulse: (!) 58 61 (!) 55 62  Resp: 16 (!) 22 (!) 21 18  Temp: 97.8 F (36.6 C)   97.9 F (36.6 C)  TempSrc: Oral   Oral  SpO2: 100% 96% 100% 100%  Weight: 73.5 kg     Height: 5' 5.25" (1.657 m)       General: appears to be stated age; alert, oriented Skin: warm, dry, no rash Head:  AT/[place] Mouth:  Oral mucosa membranes appear moist, normal dentition Neck: supple; trachea midline Heart:  RRR; did not appreciate any M/R/G Lungs: CTAB, did not appreciate any wheezes, rales, or rhonchi Abdomen: + BS; soft, ND, NT Vascular: 2+ pedal pulses b/l; 2+ radial pulses b/l Extremities: no peripheral edema, no muscle wasting Neuro:  5/5 strength of the proximal and distal flexors and extensors of the upper extremities bilaterally;5/5 strength of the proximal and distal flexors and extensors of the RLE; 4/5 strength in the LLE; sensation intact in upper and lower extremities b/l; cranial nerves II through XII grossly intact; no pronator drift; no evidence suggestive of slurred speech, dysarthria, or facial droop; Normal muscle tone. No tremors.   Labs on Admission: I have personally reviewed following labs and imaging studies  CBC: Recent Labs  Lab 06/14/21 1529 06/14/21 1545  WBC 10.0  --   NEUTROABS 7.4  --   HGB 7.8* 8.5*  HCT 27.2* 25.0*  MCV 85.3  --   PLT 230  --    Basic Metabolic  Panel: Recent Labs  Lab 06/14/21 1529 06/14/21 1545  NA 136 138  K 3.8 3.8  CL 101 101  CO2 26  --   GLUCOSE 177* 173*  BUN 23 24*  CREATININE 1.41* 1.50*  CALCIUM 8.6*  --    GFR: Estimated Creatinine Clearance: 31.2 mL/min (A) (by C-G formula based on SCr of 1.5 mg/dL (H)). Liver Function Tests: Recent Labs  Lab 06/14/21 1529  AST 13*  ALT 12  ALKPHOS 90  BILITOT 0.9  PROT 5.9*  ALBUMIN 2.7*   No results for input(s): LIPASE, AMYLASE in the last 168 hours. No results for input(s): AMMONIA in the last 168 hours. Coagulation Profile: Recent Labs  Lab 06/14/21 1529  INR 1.3*   Cardiac Enzymes: No results for input(s): CKTOTAL, CKMB, CKMBINDEX, TROPONINI in the last 168 hours. BNP (last 3 results) No results for input(s): PROBNP in the last 8760 hours. HbA1C: No results for input(s): HGBA1C in the last 72 hours. CBG: No results for input(s): GLUCAP in the last 168 hours. Lipid Profile: No results for input(s): CHOL, HDL, LDLCALC, TRIG, CHOLHDL, LDLDIRECT in the last 72 hours. Thyroid Function Tests: No results for input(s): TSH, T4TOTAL, FREET4, T3FREE, THYROIDAB in the last 72 hours. Anemia Panel: No results for input(s): VITAMINB12, FOLATE, FERRITIN, TIBC, IRON, RETICCTPCT in the last 72 hours. Urine analysis:    Component Value Date/Time   COLORURINE YELLOW 06/14/2021 1521   APPEARANCEUR HAZY (A) 06/14/2021 1521   LABSPEC 1.010 06/14/2021 1521   PHURINE 7.0 06/14/2021 1521   GLUCOSEU NEGATIVE 06/14/2021 1521   HGBUR NEGATIVE 06/14/2021 1521   BILIRUBINUR NEGATIVE 06/14/2021 1521   KETONESUR NEGATIVE 06/14/2021 1521   PROTEINUR NEGATIVE 06/14/2021 1521   NITRITE NEGATIVE 06/14/2021 1521   LEUKOCYTESUR LARGE (A) 06/14/2021 1521    Radiological Exams on Admission: CT HEAD WO CONTRAST  Result Date: 06/14/2021 CLINICAL DATA:  Neuro deficit, acute, stroke suspected new onset of left leg weakness over night. EXAM: CT HEAD WITHOUT CONTRAST TECHNIQUE:  Contiguous axial images were obtained from the base of the skull through the vertex without intravenous contrast. COMPARISON:  MRI 04/05/2021 FINDINGS: Brain: There are multiple old infarctions scattered throughout the brain. No abnormality seen affecting the brainstem. Multiple old bilateral cerebellar infarctions. Cerebral hemispheres show widespread chronic small-vessel ischemic changes of the white matter and numerous old cortical and subcortical infarctions, more numerous in the right hemisphere than the left. None of these are identifiably acute when compared to the studies of earlier this year. No mass, hemorrhage, hydrocephalus or extra-axial collection. Vascular: There is atherosclerotic calcification of the major vessels at the base of the brain. Skull: Negative Sinuses/Orbits: Clear/normal Other: None IMPRESSION: No acute finding by CT. Extensive old ischemic changes throughout the cerebellum and cerebral hemispheres as outlined above, without identifiable acute infarction or hemorrhage. Certainly, a small acute insult could be hidden within the extensive chronic changes. Electronically Signed   By: Nelson Chimes M.D.   On: 06/14/2021 16:16   MR BRAIN WO CONTRAST  Result Date: 06/14/2021 CLINICAL DATA:  Acute neurologic deficit EXAM: MRI HEAD WITHOUT CONTRAST TECHNIQUE: Multiplanar, multiecho pulse sequences of the brain and surrounding structures were obtained without intravenous contrast. COMPARISON:  None. FINDINGS: Brain: Small focus of acute ischemia within the right corona radiata. No acute or chronic hemorrhage. Multiple old infarcts of the cerebellum, right occipital lobe, right frontal lobe and the corona radiata/centrum semiovale. Diffuse generalized atrophy. Confluent white matter hyperintensity. The midline structures are normal. Vascular: Major flow voids are preserved. Skull and upper cervical spine: Normal calvarium and skull base. Visualized upper cervical spine and soft tissues are  normal. Sinuses/Orbits:No paranasal sinus fluid levels or advanced mucosal thickening. No mastoid or middle ear effusion. Normal orbits. IMPRESSION: 1. Small focus of acute ischemia within the right corona radiata. No hemorrhage or mass effect. 2. Multiple old infarcts and findings of chronic ischemic microangiopathy. 3.  Cerebral Atrophy (ICD10-G31.9). Electronically Signed   By: Ulyses Jarred M.D.   On: 06/14/2021 20:22     EKG: Independently reviewed, with result as described above.    Assessment/Plan   Principal Problem:   Acute ischemic stroke [facility]) Active Problems:   Hypertension   Normocytic anemia  Type 2 diabetes mellitus with vascular disease ([place])   Hypothyroidism    #) Acute ischemic CVA: Acute onset left lower extremity weakness, with last known normal 2200 on 06/13/2021, with persistence of this deficit, presenting MRI brain showing evidence of small focus of acute ischemia within the right corona radiata. Of note,  CT head showing no evidence of acute intracranial process, including no evidence of acute intracranial hemorrhage.   The on-call neurologist, Dr Cheral Marker, has been formally consulted  and recommends admission to the Hospitalist service for further stroke assessment, with additional recommendations pending at this time, including recommendations regarding need for additional imaging in the context of recent hospitalization for acute ischemic CVA for which time CTA head and neck were performed as well as echocardiogram.  This patient recently had hemoglobin A1c checked within the last 3 months, will refrain from repeat A1c at this time.  However, will check lipid panel in the morning. patient is not a candidate for TPA administration given that presentation is outside of the window for administration of such  Of note, the patient reportedly possesses multiple modifiable CVA risk factors including a history of type 2 diabetes mellitus, hypertension.  Currently on a daily  baby aspirin as well as atorvastatin 40 mg p.o. daily.  We will follow for neurology recommendations regarding plan to antiplatelet approach, including recs regarding potentail brief course of dual antiplatelet therapy.  Will observe permissive hypertension for 48 hours since time of LKN.  with period of observance of permissive hypertension to end at 2200 on 06/15/21.  Of note, consider beta-blocker as independent indications in the setting of CAD, will continue this medication for now, we will closely monitoring ensuing blood pressure.     Plan: Nursing bedside swallow evaluation x 1 now, and will not initiate oral medications or diet until the patient has passed this. Head of the bed at 30 degrees. Neuro checks per protocol. VS per protocol. Will allow for permissive hypertension for 48 hours since LKN, as above,  with prn IV hydralazine for systolic blood pressure greater than [id number] mmHg or diastolic blood pressure greater than 110 mmHg until 2200 on 06/15/21. Monitor on telemetry, including monitoring for atrial fibrillation as modifiable risk factor for acute ischemic CVA.  Neurology consulted, will follow closely for their additional recommendations regarding further imaging and recommendations regarding modifications to existing antiplatelet regimen.  For now, continue daily baby aspirin and atorvastatin 40 mg p.o. daily. Lipid panel in the AM, as above. PT/OT consults have been ordered to occur in the morning.       #) Asymptomatic pyuria: presenting ua showed greater than 50 white blood cells with few bacteria.  However, in the absence of any acute urinary symptoms, will consider his presentation to be most consistent with asymptomatic pyuria, and refrain from initiation of empiric antibiotics, accordingly.  Does not meet SIRS criteria for sepsis.  Plan: Repeat CBC in the morning.       #) Type 2 Diabetes Mellitus: documented history of such. Home insulin regimen: Levemir 27 units subcu  nightly. Home oral hypoglycemic agents: Glipizide. presenting blood sugar 177. Most recent A1c noted to be 7.7% when checked on 04/05/2021.  Will initiate basal insulin at reduced dose relative to outpatient dosing to reduce risk for Development of hypoglycemia, as quantified below.      Plan: accuchecks QAC and HS with low dose SSI.  Levemir 10 units subcu nightly, first dose now. hold home oral hypoglycemic agents, as above.       #)  Acquired hypothyroidism: Documented history of such, on Synthroid as an outpatient.  Plan: Continue home Synthroid.      #) Chronic iron deficiency anemia: Documented history of such, associated with apparent baseline hemoglobin range of 8.5-10, with presenting hemoglobin of 7.8 similar to most recent prior value 8.5 when checked approximately 2 weeks ago.  No evidence of active bleed at this time.  Patient is already on daily oral iron supplementation at home.  And that presenting hemoglobin is on the low end of her baseline range, and that she is at risk for supply demand mismatch contributing to her presenting acute ischemic infarct, will further evaluate her anemia via laboratory studies as detailed below.   Plan: Add on the following to presenting labs: Iron studies, B12, folic acid, reticulocyte count.  Check INR.  Repeat CBC in the morning.      #) GERD: On Protonix as an outpatient.  Plan: Continue PPI.      #) Chronic diastolic heart failure: documented history of such, with most recent echocardiogram performed on 04/05/21, will for EF 60 to 65%, and grade 1 diastolic dysfunction, with additional details as further described above. No clinical evidence to suggest acutely decompensated heart failure at this time. Patient's home diuretic regimen reportedly consists of the following: Lasix 20 mg 3 times per week, on Monday, Wednesday, Friday schedule.  Otherwise, no scheduled diuretics at home.    Plan: monitor strict I's & O's and daily  weights. Repeat BMP in the morning. Check serum magnesium level.  Reevaluate volume status in the morning to assist with determination of timing of resumption of home diuretic regimen.      DVT prophylaxis: SCD's   Code Status: Full code (confirmed per my discussion with patient) Family Communication: none Disposition Plan: Per Rounding Team Consults called: neurology (Dr. Otelia Limes) consulted, as further detailed above;  Admission status: obs; med-tele.    PLEASE NOTE THAT DRAGON DICTATION SOFTWARE WAS USED IN THE CONSTRUCTION OF THIS NOTE.   Chaney Born Aldeen Riga DO [facility]  From 7PM - 7AM   06/14/2021, 9:42 PM

## 2021-06-14 NOTE — ED Provider Notes (Signed)
Emergency Department Provider Note   I have reviewed the triage vital signs and the nursing notes.   HISTORY  Chief Complaint No chief complaint on file.   HPI Erin Baker is a 80 y.o. female with PMH reviewed below including prior CVA but no lingering deficits to the emergency department with left leg weakness.  Patient was discharged home from a nursing facility after recent hospitalization.  The patient's daughter at bedside states that she had no residual stroke deficits from her recent stroke but had developed some lower oxygen saturation and was sent to the nursing facility for pulmonary rehab.  She was due to be discharged today and when she awoke this morning she noticed tingling/numbness in the left leg as well as significant weakness, which was not present yesterday.  The patient's daughter took her home but noticed that when she attempted to use the [place] she was dragging her left foot along the ground.  Symptoms have continued and so she presents for evaluation.  She is not having back pain or neck pain.  No weakness or numbness in the left arm or face.  No vision or speech change.  No difficulty swallowing.  No fevers. No HA.    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   Cerebral embolism with cerebral infarction 04/05/2021    Past Surgical History:  Procedure Laterality Date   APPENDECTOMY     CARDIAC CATHETERIZATION     COLON SURGERY     CORONARY  ANGIOPLASTY     TRANSCAROTID ARTERY REVASCULARIZATION  Right 04/12/2021   Procedure: RIGHT TRANSCAROTID ARTERY REVASCULARIZATION;  Surgeon: Marty Heck, MD;  Location: [facility];  Service: Vascular;  Laterality: Right;   ULTRASOUND GUIDANCE FOR VASCULAR ACCESS Left 04/12/2021   Procedure: ULTRASOUND GUIDANCE FOR VASCULAR ACCESS, LEFT FEMORAL VEIN;  Surgeon: Marty Heck, MD;  Location: [facility];  Service: Vascular;  Laterality: Left;    Allergies Oxycodone-acetaminophen, Macrobid [nitrofurantoin], Morphine, Neurontin [gabapentin], Sulfa antibiotics, Tape, Tramadol, Latex, and Levofloxacin  Family History  Problem Relation Age of Onset   Diabetes type II Granddaughter    CAD Neg Hx     Social History Social History   Tobacco Use   Smoking status: Former    Types: Cigarettes    Passive exposure: Never   Smokeless tobacco: Never  Substance Use Topics   Alcohol use: Never   Drug use: Never    Review of Systems  Constitutional: No fever/chills Eyes: No visual changes. ENT: No sore throat. Cardiovascular: Denies chest pain. Respiratory: Denies shortness of breath. Gastrointestinal: No abdominal pain.  No nausea, no vomiting.  No diarrhea.  No constipation. Genitourinary: Negative for dysuria. Musculoskeletal: Negative for back pain. Skin: Negative for rash. Neurological: Negative for headaches. Positive left leg weakness/numbness.   10-point ROS otherwise negative.  ____________________________________________   PHYSICAL EXAM:  VITAL SIGNS: ED Triage Vitals  Enc Vitals Group     BP 06/14/21 1519 (!) 117/51  Pulse Rate 06/14/21 1519 67     Resp 06/14/21 1519 18     Temp 06/14/21 1519 98.2 F (36.8 C)     Temp Source 06/14/21 1645 Oral     SpO2 06/14/21 1519 94 %   Constitutional: Alert and oriented. Well appearing and in no acute distress. Eyes: Conjunctivae are normal. Head: Atraumatic. Nose: No congestion/rhinnorhea. Mouth/Throat: Mucous  membranes are moist.   Neck: No stridor.  Cardiovascular: Normal rate, regular rhythm. Good peripheral circulation. Grossly normal heart sounds.   Respiratory: Normal respiratory effort.  No retractions. Lungs CTAB. Gastrointestinal: Soft and nontender. No distention.  Musculoskeletal: No lower extremity tenderness nor edema. No gross deformities of extremities. Neurologic:  Normal speech and language.  No facial asymmetry.  Normal sensation to the bilateral upper extremities and face.  5/5 strength in the bilateral upper extremities with no pronator drift. 4/5 strength in the LLE with hip flexion compared to 5/5 on the right. Subjective decreased sensation to the RLE compared to the left.  Skin:  Skin is warm, dry and intact. No rash noted.   ____________________________________________   LABS (all labs ordered are listed, but only abnormal results are displayed)  Labs Reviewed  URINALYSIS, ROUTINE W REFLEX MICROSCOPIC - Abnormal; Notable for the following components:      Result Value   APPearance HAZY (*)    Leukocytes,Ua LARGE (*)    WBC, UA >50 (*)    Bacteria, UA FEW (*)    All other components within normal limits  PROTIME-INR - Abnormal; Notable for the following components:   Prothrombin Time 15.9 (*)    INR 1.3 (*)    All other components within normal limits  CBC - Abnormal; Notable for the following components:   RBC 3.19 (*)    Hemoglobin 7.8 (*)    HCT 27.2 (*)    MCH 24.5 (*)    MCHC 28.7 (*)    RDW 17.8 (*)    All other components within normal limits  COMPREHENSIVE METABOLIC PANEL - Abnormal; Notable for the following components:   Glucose, Bld 177 (*)    Creatinine, Ser 1.41 (*)    Calcium 8.6 (*)    Total Protein 5.9 (*)    Albumin 2.7 (*)    AST 13 (*)    GFR, Estimated 38 (*)    All other components within normal limits  IRON AND TIBC - Abnormal; Notable for the following components:   Iron 20 (*)    Saturation Ratios 6 (*)    All other components  within normal limits  RETICULOCYTES - Abnormal; Notable for the following components:   RBC. 3.40 (*)    Immature Retic Fract 23.4 (*)    All other components within normal limits  LIPID PANEL - Abnormal; Notable for the following components:   HDL 29 (*)    All other components within normal limits  MAGNESIUM - Abnormal; Notable for the following components:   Magnesium 1.6 (*)    All other components within normal limits  COMPREHENSIVE METABOLIC PANEL - Abnormal; Notable for the following components:   Potassium 3.4 (*)    Glucose, Bld 270 (*)    Creatinine, Ser 1.15 (*)    Calcium 8.3 (*)    Total Protein 5.1 (*)    Albumin 2.3 (*)    AST 14 (*)    GFR, Estimated 49 (*)    All other components within normal limits  CBC - Abnormal; Notable for the following components:  RBC 2.91 (*)    Hemoglobin 7.0 (*)    HCT 24.3 (*)    MCH 24.1 (*)    MCHC 28.8 (*)    RDW 17.8 (*)    All other components within normal limits  RETICULOCYTES - Abnormal; Notable for the following components:   RBC. 2.97 (*)    Immature Retic Fract 23.0 (*)    All other components within normal limits  GLUCOSE, CAPILLARY - Abnormal; Notable for the following components:   Glucose-Capillary 202 (*)    All other components within normal limits  HEMOGLOBIN AND HEMATOCRIT, BLOOD - Abnormal; Notable for the following components:   Hemoglobin 7.2 (*)    HCT 25.0 (*)    All other components within normal limits  GLUCOSE, CAPILLARY - Abnormal; Notable for the following components:   Glucose-Capillary 130 (*)    All other components within normal limits  I-STAT CHEM 8, ED - Abnormal; Notable for the following components:   BUN 24 (*)    Creatinine, Ser 1.50 (*)    Glucose, Bld 173 (*)    Calcium, Ion 1.11 (*)    Hemoglobin 8.5 (*)    HCT 25.0 (*)    All other components within normal limits  CBG MONITORING, ED - Abnormal; Notable for the following components:   Glucose-Capillary 214 (*)    All other  components within normal limits  RESP PANEL BY RT-PCR (FLU A&B, COVID) ARPGX2  APTT  DIFFERENTIAL  VITAMIN B12  FOLATE  FERRITIN  MAGNESIUM  VITAMIN B12  FOLATE  IRON AND TIBC  FERRITIN  HEMOGLOBIN A1C  OCCULT BLOOD X 1 CARD TO LAB, STOOL  HEMOGLOBIN AND HEMATOCRIT, BLOOD  HEMOGLOBIN AND HEMATOCRIT, BLOOD  CBG MONITORING, ED   ____________________________________________  EKG   EKG Interpretation  Date/Time:  Wednesday June 14 2021 18:26:19 EST Ventricular Rate:  59 PR Interval:  172 QRS Duration: 97 QT Interval:  483 QTC Calculation: 479 R Axis:   -45 Text Interpretation: Sinus rhythm Left anterior fascicular block Low voltage, precordial leads Abnormal R-wave progression, early transition Consider anterior infarct Similar to Oct 2022 tracing Confirmed by Nanda Quinton [phone]) on 06/14/2021 6:30:42 PM        ____________________________________________  RADIOLOGY  CT HEAD WO CONTRAST  Result Date: 06/14/2021 CLINICAL DATA:  Neuro deficit, acute, stroke suspected new onset of left leg weakness over night. EXAM: CT HEAD WITHOUT CONTRAST TECHNIQUE: Contiguous axial images were obtained from the base of the skull through the vertex without intravenous contrast. COMPARISON:  MRI 04/05/2021 FINDINGS: Brain: There are multiple old infarctions scattered throughout the brain. No abnormality seen affecting the brainstem. Multiple old bilateral cerebellar infarctions. Cerebral hemispheres show widespread chronic small-vessel ischemic changes of the white matter and numerous old cortical and subcortical infarctions, more numerous in the right hemisphere than the left. None of these are identifiably acute when compared to the studies of earlier this year. No mass, hemorrhage, hydrocephalus or extra-axial collection. Vascular: There is atherosclerotic calcification of the major vessels at the base of the brain. Skull: Negative Sinuses/Orbits: Clear/normal Other: None IMPRESSION: No  acute finding by CT. Extensive old ischemic changes throughout the cerebellum and cerebral hemispheres as outlined above, without identifiable acute infarction or hemorrhage. Certainly, a small acute insult could be hidden within the extensive chronic changes. Electronically Signed   By: Nelson Chimes M.D.   On: 06/14/2021 16:16   MR BRAIN WO CONTRAST  Result Date: 06/14/2021 CLINICAL DATA:  Acute neurologic deficit EXAM: MRI HEAD WITHOUT  CONTRAST TECHNIQUE: Multiplanar, multiecho pulse sequences of the brain and surrounding structures were obtained without intravenous contrast. COMPARISON:  None. FINDINGS: Brain: Small focus of acute ischemia within the right corona radiata. No acute or chronic hemorrhage. Multiple old infarcts of the cerebellum, right occipital lobe, right frontal lobe and the corona radiata/centrum semiovale. Diffuse generalized atrophy. Confluent white matter hyperintensity. The midline structures are normal. Vascular: Major flow voids are preserved. Skull and upper cervical spine: Normal calvarium and skull base. Visualized upper cervical spine and soft tissues are normal. Sinuses/Orbits:No paranasal sinus fluid levels or advanced mucosal thickening. No mastoid or middle ear effusion. Normal orbits. IMPRESSION: 1. Small focus of acute ischemia within the right corona radiata. No hemorrhage or mass effect. 2. Multiple old infarcts and findings of chronic ischemic microangiopathy. 3.  Cerebral Atrophy (ICD10-G31.9). Electronically Signed   By: Ulyses Jarred M.D.   On: 06/14/2021 20:22    ____________________________________________   PROCEDURES  Procedure(s) performed:   Procedures  None  ____________________________________________   INITIAL IMPRESSION / ASSESSMENT AND PLAN / ED COURSE  Pertinent labs & imaging results that were available during my care of the patient were reviewed by me and considered in my medical decision making (see chart for details).   Patient  presents emergency department with isolated left leg weakness and some subjective decrease sensation in the left leg.  She awoke this morning with symptoms.  She does have some weakness on the left especially with hip flexion.  Sensation is subjectively decreased in the left leg only.  No back pain to suspect cord compression or other acute spine emergency.  Question acute stroke given her history.  Lab work and CT imaging obtained during the MSE process shows no acute finding by CT. old ischemic changes noted.  No intracranial hemorrhage.  Given focal deficit plan for MRI to evaluate for stroke.  Additionally, patient has anemia here worse when compared to labs from October.  She was hospitalized, in the interim, at the [facility] location.  Her hemoglobin is unchanged from that hospitalization.  She does not recall seeing blood in the bowel movements.  She was taken off of her Plavix in the setting and has not, as of yet, been cleared to re-start but no bleeding source reportedly found.   Labs reviewed. MRI with acute infarct. Discussed case with Dr. Cheral Marker who will evaluate. Plan for admit.   Discussed patient's case with [facility] to request admission. Patient and family (if present) updated with plan. Care transferred to [facility] service.  I reviewed all nursing notes, vitals, pertinent old records, EKGs, labs, imaging (as available).  ____________________________________________  FINAL CLINICAL IMPRESSION(S) / ED DIAGNOSES  Final diagnoses:  Cerebrovascular accident (CVA), unspecified mechanism ([place])  Left leg weakness     MEDICATIONS GIVEN DURING THIS VISIT:  Medications  acetaminophen (TYLENOL) tablet 650 mg (has no administration in time range)    Or  acetaminophen (TYLENOL) suppository 650 mg (has no administration in time range)  insulin aspart (novoLOG) injection 0-9 Units (0 Units Subcutaneous Patient Refused/Not Given 06/15/21 1157)  aspirin chewable tablet 81 mg (81 mg Oral  Given 06/15/21 0848)  atorvastatin (LIPITOR) tablet 40 mg (40 mg Oral Given 06/15/21 0848)  ferrous sulfate tablet 325 mg (325 mg Oral Given 06/15/21 0847)  levothyroxine (SYNTHROID) tablet 100 mcg (100 mcg Oral Given 06/15/21 0604)  metoprolol tartrate (LOPRESSOR) tablet 25 mg (25 mg Oral Given 06/15/21 0848)  pantoprazole (PROTONIX) EC tablet 40 mg (has no administration in time  range)  sertraline (ZOLOFT) tablet 25 mg (25 mg Oral Given 06/15/21 0847)  umeclidinium bromide (INCRUSE ELLIPTA) 62.5 MCG/ACT 1 puff (1 puff Inhalation Given 06/15/21 0912)  magnesium oxide (MAG-OX) tablet 400 mg (400 mg Oral Given 06/15/21 0920)  cefTRIAXone (ROCEPHIN) 1 g in sodium chloride 0.9 % 100 mL IVPB (1 g Intravenous New Bag/Given 06/15/21 1206)  insulin detemir (LEVEMIR) injection 15 Units (has no administration in time range)   stroke: mapping our early stages of recovery book ( Does not apply Given 06/15/21 0647)  potassium chloride SA (KLOR-CON) CR tablet 40 mEq (40 mEq Oral Given 06/15/21 0920)     Note:  This document was prepared using Dragon voice recognition software and may include unintentional dictation errors.  Nanda Quinton, MD, [facility] Emergency Medicine    Thaily Hackworth, [facility], MD 06/15/21 [phone]

## 2021-06-14 NOTE — ED Provider Notes (Signed)
Emergency Medicine Provider Triage Evaluation Note  Erin Baker , a 80 y.o. female  was evaluated in triage.  Pt complains of left lower leg weakness that she noted this morning when she was walking to the bathroom in her rehab center around 5 AM.  Felt as though her leg was dragging.  History of multiple strokes.  No other concerns.  Review of Systems  Positive: Weakness Negative: Numbness or tingling  Physical Exam  BP (!) 117/51 (BP Location: Left Arm)   Pulse 67   Temp 98.2 F (36.8 C)   Resp 18   SpO2 94%  Gen:   Awake, no distress   Resp:  Normal effort  MSK:   Moves extremities without difficulty  Other:  No facial droop.  EOMs intact.  Alert and oriented x3.  Lateral deviation of tongue with deviation to the left side which she reports to be a deficit from a previous stroke.  Slightly decreased handgrip on the left side, also reportedly from a previous CVA.  No pronator drift.  Medical Decision Making  Medically screening exam initiated at 3:27 PM.  Appropriate orders placed.  Dina Warbington was informed that the remainder of the evaluation will be completed by another provider, this initial triage assessment does not replace that evaluation, and the importance of remaining in the ED until their evaluation is complete.     Saddie Benders, PA-C 06/14/21 1538    Rozelle Logan, DO 06/14/21 8032

## 2021-06-14 NOTE — ED Notes (Signed)
Patient transported to MRI 

## 2021-06-15 ENCOUNTER — Inpatient Hospital Stay (HOSPITAL_COMMUNITY): Payer: Medicare Other

## 2021-06-15 DIAGNOSIS — Z79899 Other long term (current) drug therapy: Secondary | ICD-10-CM | POA: Diagnosis not present

## 2021-06-15 DIAGNOSIS — Z888 Allergy status to other drugs, medicaments and biological substances status: Secondary | ICD-10-CM | POA: Diagnosis not present

## 2021-06-15 DIAGNOSIS — I639 Cerebral infarction, unspecified: Secondary | ICD-10-CM | POA: Diagnosis not present

## 2021-06-15 DIAGNOSIS — Z91040 Latex allergy status: Secondary | ICD-10-CM | POA: Diagnosis not present

## 2021-06-15 DIAGNOSIS — E876 Hypokalemia: Secondary | ICD-10-CM | POA: Diagnosis present

## 2021-06-15 DIAGNOSIS — I110 Hypertensive heart disease with heart failure: Secondary | ICD-10-CM | POA: Diagnosis present

## 2021-06-15 DIAGNOSIS — E785 Hyperlipidemia, unspecified: Secondary | ICD-10-CM | POA: Diagnosis present

## 2021-06-15 DIAGNOSIS — D509 Iron deficiency anemia, unspecified: Secondary | ICD-10-CM | POA: Diagnosis present

## 2021-06-15 DIAGNOSIS — R29702 NIHSS score 2: Secondary | ICD-10-CM | POA: Diagnosis present

## 2021-06-15 DIAGNOSIS — G8194 Hemiplegia, unspecified affecting left nondominant side: Secondary | ICD-10-CM | POA: Diagnosis present

## 2021-06-15 DIAGNOSIS — Z87891 Personal history of nicotine dependence: Secondary | ICD-10-CM | POA: Diagnosis not present

## 2021-06-15 DIAGNOSIS — Z881 Allergy status to other antibiotic agents status: Secondary | ICD-10-CM | POA: Diagnosis not present

## 2021-06-15 DIAGNOSIS — Z20822 Contact with and (suspected) exposure to covid-19: Secondary | ICD-10-CM | POA: Diagnosis present

## 2021-06-15 DIAGNOSIS — I5032 Chronic diastolic (congestive) heart failure: Secondary | ICD-10-CM | POA: Diagnosis present

## 2021-06-15 DIAGNOSIS — N390 Urinary tract infection, site not specified: Secondary | ICD-10-CM | POA: Diagnosis present

## 2021-06-15 DIAGNOSIS — E039 Hypothyroidism, unspecified: Secondary | ICD-10-CM | POA: Diagnosis present

## 2021-06-15 DIAGNOSIS — R29898 Other symptoms and signs involving the musculoskeletal system: Secondary | ICD-10-CM | POA: Diagnosis present

## 2021-06-15 DIAGNOSIS — Z91048 Other nonmedicinal substance allergy status: Secondary | ICD-10-CM | POA: Diagnosis not present

## 2021-06-15 DIAGNOSIS — Z8673 Personal history of transient ischemic attack (TIA), and cerebral infarction without residual deficits: Secondary | ICD-10-CM | POA: Diagnosis not present

## 2021-06-15 DIAGNOSIS — E1151 Type 2 diabetes mellitus with diabetic peripheral angiopathy without gangrene: Secondary | ICD-10-CM | POA: Diagnosis present

## 2021-06-15 DIAGNOSIS — I444 Left anterior fascicular block: Secondary | ICD-10-CM | POA: Diagnosis present

## 2021-06-15 DIAGNOSIS — Z882 Allergy status to sulfonamides status: Secondary | ICD-10-CM | POA: Diagnosis not present

## 2021-06-15 DIAGNOSIS — N179 Acute kidney failure, unspecified: Secondary | ICD-10-CM | POA: Diagnosis present

## 2021-06-15 DIAGNOSIS — Z885 Allergy status to narcotic agent status: Secondary | ICD-10-CM | POA: Diagnosis not present

## 2021-06-15 DIAGNOSIS — I63511 Cerebral infarction due to unspecified occlusion or stenosis of right middle cerebral artery: Secondary | ICD-10-CM | POA: Diagnosis present

## 2021-06-15 LAB — CBC
HCT: 24.3 % — ABNORMAL LOW (ref 36.0–46.0)
Hemoglobin: 7 g/dL — ABNORMAL LOW (ref 12.0–15.0)
MCH: 24.1 pg — ABNORMAL LOW (ref 26.0–34.0)
MCHC: 28.8 g/dL — ABNORMAL LOW (ref 30.0–36.0)
MCV: 83.5 fL (ref 80.0–100.0)
Platelets: 215 10*3/uL (ref 150–400)
RBC: 2.91 MIL/uL — ABNORMAL LOW (ref 3.87–5.11)
RDW: 17.8 % — ABNORMAL HIGH (ref 11.5–15.5)
WBC: 8.5 10*3/uL (ref 4.0–10.5)
nRBC: 0 % (ref 0.0–0.2)

## 2021-06-15 LAB — RESP PANEL BY RT-PCR (FLU A&B, COVID) ARPGX2
Influenza A by PCR: NEGATIVE
Influenza B by PCR: NEGATIVE
SARS Coronavirus 2 by RT PCR: NEGATIVE

## 2021-06-15 LAB — COMPREHENSIVE METABOLIC PANEL
ALT: 9 U/L (ref 0–44)
AST: 14 U/L — ABNORMAL LOW (ref 15–41)
Albumin: 2.3 g/dL — ABNORMAL LOW (ref 3.5–5.0)
Alkaline Phosphatase: 78 U/L (ref 38–126)
Anion gap: 10 (ref 5–15)
BUN: 22 mg/dL (ref 8–23)
CO2: 23 mmol/L (ref 22–32)
Calcium: 8.3 mg/dL — ABNORMAL LOW (ref 8.9–10.3)
Chloride: 103 mmol/L (ref 98–111)
Creatinine, Ser: 1.15 mg/dL — ABNORMAL HIGH (ref 0.44–1.00)
GFR, Estimated: 49 mL/min — ABNORMAL LOW (ref >60)
Glucose, Bld: 270 mg/dL — ABNORMAL HIGH (ref 70–99)
Potassium: 3.4 mmol/L — ABNORMAL LOW (ref 3.5–5.1)
Sodium: 136 mmol/L (ref 135–145)
Total Bilirubin: 0.7 mg/dL (ref 0.3–1.2)
Total Protein: 5.1 g/dL — ABNORMAL LOW (ref 6.5–8.1)

## 2021-06-15 LAB — HEMOGLOBIN AND HEMATOCRIT, BLOOD
HCT: 25 % — ABNORMAL LOW (ref 36.0–46.0)
HCT: 24.9 % — ABNORMAL LOW (ref 36.0–46.0)
Hemoglobin: 7.2 g/dL — ABNORMAL LOW (ref 12.0–15.0)
Hemoglobin: 7.4 g/dL — ABNORMAL LOW (ref 12.0–15.0)

## 2021-06-15 LAB — RETICULOCYTES
Immature Retic Fract: 23 % — ABNORMAL HIGH (ref 2.3–15.9)
RBC.: 2.97 MIL/uL — ABNORMAL LOW (ref 3.87–5.11)
Retic Count, Absolute: 78.7 10*3/uL (ref 19.0–186.0)
Retic Ct Pct: 2.7 % (ref 0.4–3.1)

## 2021-06-15 LAB — MAGNESIUM: Magnesium: 1.6 mg/dL — ABNORMAL LOW (ref 1.7–2.4)

## 2021-06-15 LAB — LIPID PANEL
Cholesterol: 97 mg/dL (ref 0–200)
HDL: 29 mg/dL — ABNORMAL LOW (ref >40)
LDL Cholesterol: 51 mg/dL (ref 0–99)
Total CHOL/HDL Ratio: 3.3 RATIO
Triglycerides: 83 mg/dL (ref <150)
VLDL: 17 mg/dL (ref 0–40)

## 2021-06-15 LAB — FERRITIN: Ferritin: 32 ng/mL (ref 11–307)

## 2021-06-15 LAB — GLUCOSE, CAPILLARY
Glucose-Capillary: 202 mg/dL — ABNORMAL HIGH (ref 70–99)
Glucose-Capillary: 130 mg/dL — ABNORMAL HIGH (ref 70–99)
Glucose-Capillary: 125 mg/dL — ABNORMAL HIGH (ref 70–99)
Glucose-Capillary: 195 mg/dL — ABNORMAL HIGH (ref 70–99)

## 2021-06-15 LAB — CBG MONITORING, ED: Glucose-Capillary: 214 mg/dL — ABNORMAL HIGH (ref 70–99)

## 2021-06-15 LAB — IRON AND TIBC
Iron: 43 ug/dL (ref 28–170)
Saturation Ratios: 14 % (ref 10.4–31.8)
TIBC: 305 ug/dL (ref 250–450)
UIBC: 262 ug/dL

## 2021-06-15 LAB — FOLATE
Folate: 7.8 ng/mL (ref >5.9)
Folate: 9.6 ng/mL (ref >5.9)

## 2021-06-15 LAB — VITAMIN B12: Vitamin B-12: 243 pg/mL (ref 180–914)

## 2021-06-15 IMAGING — CT CT ANGIO HEAD-NECK (W OR W/O PERF)
1 of 11 series · 5 of 33 positions shown · IV contrast (omnipaque)
Comparison: None.

CLINICAL DATA: Stroke follow-up



[Series 11: ax thins · axial · 0.39mm/px · z∈[+1027,+1261]mm · 5 of 360 slices shown]
[im 60/360  soft-tissue]
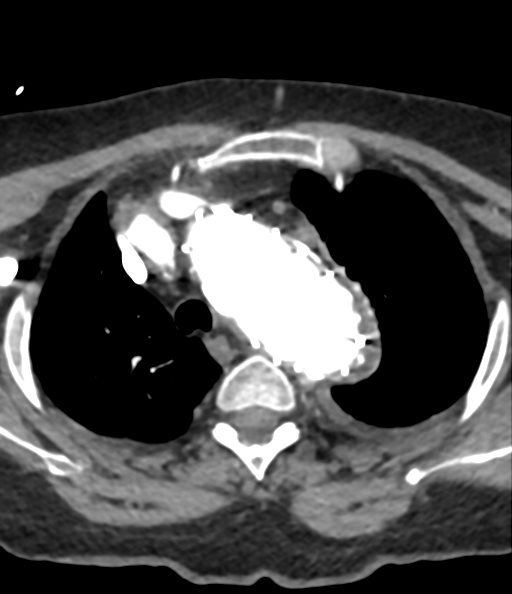
[im 120/360  bone]
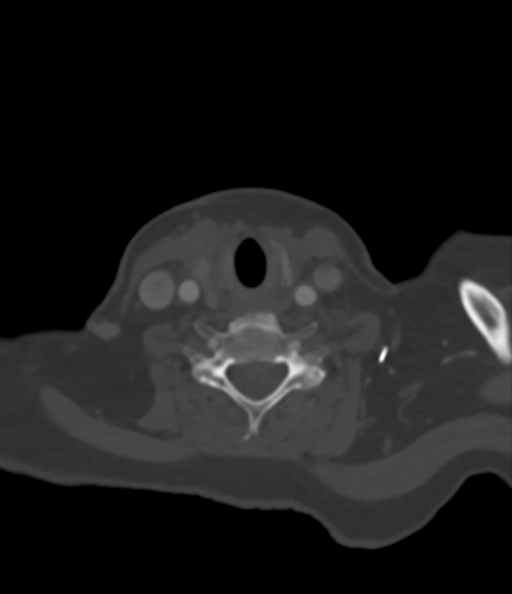
[im 180/360  soft-tissue]
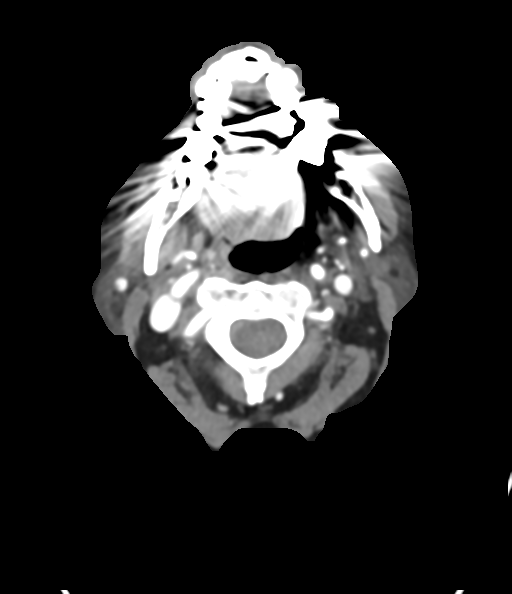
[im 240/360  bone]
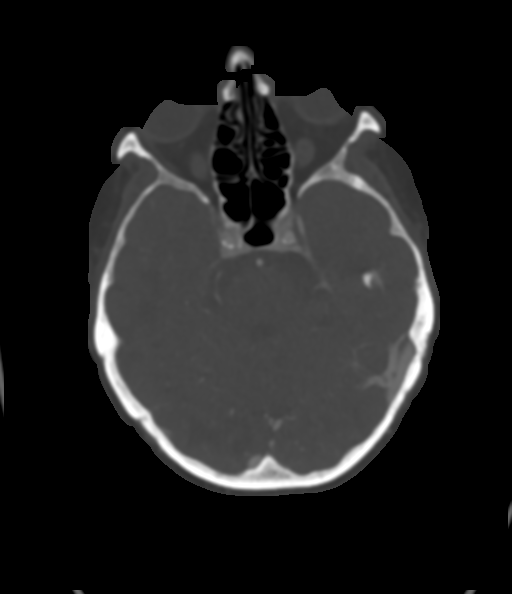
[im 300/360  soft-tissue]
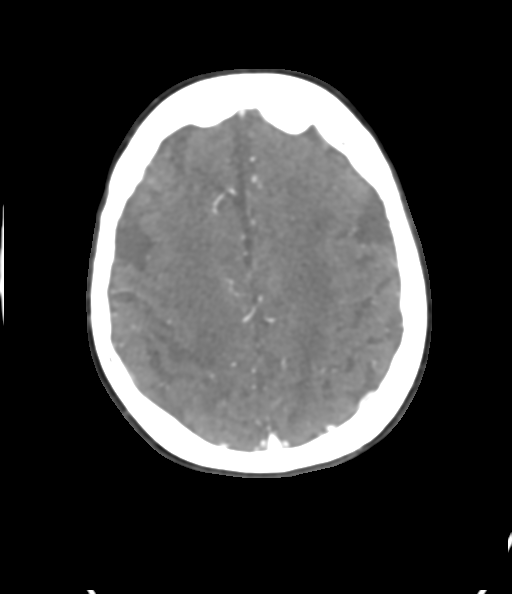

[5 of 33 positions shown; findings below may reference images not displayed]

FINDINGS: CT HEAD FINDINGS

Brain: There is no mass, hemorrhage or extra-axial collection. There
is generalized brain atrophy greater than expected at this age.
There are multiple old infarcts, greatest in the posterior right MCA
territory. There is hypoattenuation of the periventricular white
matter, most commonly indicating chronic ischemic microangiopathy.

Skull: The visualized skull base, calvarium and extracranial soft
tissues are normal.

Sinuses/Orbits: No fluid levels or advanced mucosal thickening of
the visualized paranasal sinuses. No mastoid or middle ear effusion.
The orbits are normal.

CTA NECK FINDINGS

SKELETON: There is no bony spinal canal stenosis. No lytic or
blastic lesion.

OTHER NECK: Normal pharynx, larynx and major salivary glands. No
cervical lymphadenopathy. Unremarkable thyroid gland.

UPPER CHEST: Dependent atelectasis in the left upper lobe.

AORTIC ARCH:

There is calcific atherosclerosis of the aortic arch. There is a
stent within the aortic arch. Conventional 3 vessel aortic branching
pattern. The left subclavian artery origin remains occluded. There
is a patent left common carotid to subclavian graft.

RIGHT CAROTID SYSTEM:  Right ICA stent is patent.

LEFT CAROTID SYSTEM: No dissection, occlusion or aneurysm. There is
mixed density atherosclerosis extending into the proximal ICA,
resulting in less than 50% stenosis.

VERTEBRAL ARTERIES: Left dominant configuration. Both origins are
clearly patent. There is no dissection, occlusion or flow-limiting
stenosis to the skull base (V1-V3 segments).

CTA HEAD FINDINGS

POSTERIOR CIRCULATION:

--Vertebral arteries: Normal V4 segments.

--Inferior cerebellar arteries: Normal.

--Basilar artery: Normal.

--Superior cerebellar arteries: Normal.

--Posterior cerebral arteries (PCA): Normal.

ANTERIOR CIRCULATION:

--Intracranial internal carotid arteries: Normal.

--Anterior cerebral arteries (ACA): Normal. Both A1 segments are
present. Patent anterior communicating artery (a-comm).

--Middle cerebral arteries (MCA): Normal.

VENOUS SINUSES: As permitted by contrast timing, patent.

ANATOMIC VARIANTS: None

Review of the MIP images confirms the above findings.
IMPRESSION: 1. No emergent large vessel occlusion or high-grade stenosis of the
intracranial arteries.
2. Patent right ICA stent.
3. Multiple old infarcts and findings of chronic ischemic
microangiopathy.
4. Stent-treated thoracic aortic aneurysm.

Aortic Atherosclerosis ([Q3]-[Q3]). Cerebral Atrophy
([Q3]-[Q3]).

## 2021-06-15 MED ORDER — MAGNESIUM OXIDE -MG SUPPLEMENT 400 (240 MG) MG PO TABS
400.0000 mg | ORAL_TABLET | Freq: Two times a day (BID) | ORAL | Status: AC
  Administered 2021-06-15 – 2021-06-17 (×6): 400 mg via ORAL
  Filled 2021-06-15 (×6): qty 1

## 2021-06-15 MED ORDER — ATORVASTATIN CALCIUM 40 MG PO TABS
40.0000 mg | ORAL_TABLET | Freq: Every day | ORAL | Status: DC
  Administered 2021-06-15 – 2021-06-19 (×5): 40 mg via ORAL
  Filled 2021-06-15 (×5): qty 1

## 2021-06-15 MED ORDER — UMECLIDINIUM BROMIDE 62.5 MCG/ACT IN AEPB
1.0000 | INHALATION_SPRAY | Freq: Every day | RESPIRATORY_TRACT | Status: DC
  Administered 2021-06-15 – 2021-06-19 (×5): 1 via RESPIRATORY_TRACT
  Filled 2021-06-15: qty 7

## 2021-06-15 MED ORDER — IOHEXOL 350 MG/ML SOLN
80.0000 mL | Freq: Once | INTRAVENOUS | Status: AC | PRN
  Administered 2021-06-15: 19:00:00 80 mL via INTRAVENOUS

## 2021-06-15 MED ORDER — FERROUS SULFATE 325 (65 FE) MG PO TABS
325.0000 mg | ORAL_TABLET | Freq: Every day | ORAL | Status: DC
  Administered 2021-06-15 – 2021-06-19 (×5): 325 mg via ORAL
  Filled 2021-06-15 (×6): qty 1

## 2021-06-15 MED ORDER — PANTOPRAZOLE SODIUM 40 MG PO TBEC
40.0000 mg | DELAYED_RELEASE_TABLET | Freq: Two times a day (BID) | ORAL | Status: DC
  Administered 2021-06-16 – 2021-06-19 (×7): 40 mg via ORAL
  Filled 2021-06-15 (×7): qty 1

## 2021-06-15 MED ORDER — SODIUM CHLORIDE 0.9 % IV SOLN
1.0000 g | INTRAVENOUS | Status: AC
  Administered 2021-06-15 – 2021-06-17 (×3): 1 g via INTRAVENOUS
  Filled 2021-06-15 (×3): qty 10

## 2021-06-15 MED ORDER — POTASSIUM CHLORIDE CRYS ER 20 MEQ PO TBCR
40.0000 meq | EXTENDED_RELEASE_TABLET | Freq: Once | ORAL | Status: AC
  Administered 2021-06-15: 09:00:00 40 meq via ORAL
  Filled 2021-06-15: qty 2

## 2021-06-15 MED ORDER — INSULIN DETEMIR 100 UNIT/ML ~~LOC~~ SOLN
10.0000 [IU] | Freq: Every day | SUBCUTANEOUS | Status: DC
  Administered 2021-06-15: 10 [IU] via SUBCUTANEOUS
  Filled 2021-06-15 (×2): qty 0.1

## 2021-06-15 MED ORDER — INSULIN DETEMIR 100 UNIT/ML ~~LOC~~ SOLN
15.0000 [IU] | Freq: Every day | SUBCUTANEOUS | Status: DC
  Administered 2021-06-15 – 2021-06-18 (×4): 15 [IU] via SUBCUTANEOUS
  Filled 2021-06-15 (×5): qty 0.15

## 2021-06-15 MED ORDER — SERTRALINE HCL 50 MG PO TABS
25.0000 mg | ORAL_TABLET | Freq: Every day | ORAL | Status: DC
  Administered 2021-06-15 – 2021-06-19 (×5): 25 mg via ORAL
  Filled 2021-06-15 (×5): qty 1

## 2021-06-15 MED ORDER — ASPIRIN 81 MG PO CHEW
81.0000 mg | CHEWABLE_TABLET | Freq: Every day | ORAL | Status: DC
  Administered 2021-06-15 – 2021-06-16 (×2): 81 mg via ORAL
  Filled 2021-06-15 (×2): qty 1

## 2021-06-15 MED ORDER — METOPROLOL TARTRATE 25 MG PO TABS
25.0000 mg | ORAL_TABLET | Freq: Two times a day (BID) | ORAL | Status: DC
  Administered 2021-06-15 – 2021-06-18 (×7): 25 mg via ORAL
  Filled 2021-06-15 (×7): qty 1

## 2021-06-15 MED ORDER — LEVOTHYROXINE SODIUM 100 MCG PO TABS
100.0000 ug | ORAL_TABLET | Freq: Every day | ORAL | Status: DC
Start: 2021-06-15 — End: 2021-06-19
  Administered 2021-06-15 – 2021-06-19 (×5): 100 ug via ORAL
  Filled 2021-06-15 (×5): qty 1

## 2021-06-15 NOTE — TOC Initial Note (Addendum)
Transition of Care [facility]) - Initial/Assessment Note    Patient Details  Name: Erin Baker MRN: [id number] Date of Birth: 08/10/1940  Transition of Care [facility]) CM/SW Contact:    Kermit Balo, RN Phone Number: 06/15/2021, 11:15 AM  Clinical Narrative:                 Patient has been at [facility] for the past 2 weeks for rehab. She was to discharge home the day the returned to the hospital. She is in agreement with SNF rehab but does not want to return to [facility]. She doesn't feels she was receiving adequate rehab at the facility.  Pt agreeable to being faxed out in the [facility] areas. She also asked to have her information sent to [facility].  CM updated her that she most likely will be in co pay days for SNF rehab or will very soon be in co pay days and she voiced understanding.  TOC following.  1518: CM provided the patient with bed offers for SNF. Pt to look over and update CM on choice in am.  Expected Discharge Plan: Skilled Nursing Facility Barriers to Discharge: Continued Medical Work up   Patient Goals and CMS Choice   CMS Medicare.gov Compare Post Acute Care list provided to:: Patient Choice offered to / list presented to : Patient  Expected Discharge Plan and Services Expected Discharge Plan: Skilled Nursing Facility In-house Referral: Clinical Social Work Discharge Planning Services: CM Consult Post Acute Care Choice: Skilled Nursing Facility Living arrangements for the past 2 months: Skilled Nursing Facility                                      Prior Living Arrangements/Services Living arrangements for the past 2 months: Skilled Nursing Facility Lives with:: Facility Resident Patient language and need for interpreter reviewed:: Yes Do you feel safe going back to the place where you live?: Yes      Need for Family Participation in Patient Care: Yes (Comment) Care giver support system in place?: No (comment)   Criminal  Activity/Legal Involvement Pertinent to Current Situation/Hospitalization: No - Comment as needed  Activities of Daily Living      Permission Sought/Granted                  Emotional Assessment Appearance:: Appears stated age Attitude/Demeanor/Rapport: Engaged Affect (typically observed): Accepting Orientation: : Oriented to Self, Oriented to Place, Oriented to  Time, Oriented to Situation   Psych Involvement: No (comment)  Admission diagnosis:  Left leg weakness [R29.898] Acute ischemic stroke [facility]) [I63.9] Cerebrovascular accident (CVA), unspecified mechanism ([facility]) [I63.9] Patient Active Problem List   Diagnosis Date Noted   Acute ischemic stroke (HCC) 06/14/2021   Ascending aortic aneurysm 04/13/2021   Hypothyroidism 04/13/2021   Carotid stenosis, right 04/12/2021   CAD (coronary artery disease) 04/05/2021   Hypertension 04/05/2021   Normocytic anemia 04/05/2021   Type 2 diabetes mellitus with vascular disease (HCC) 04/05/2021   Cerebral embolism with cerebral infarction 04/05/2021   PCP:  Clemencia Course, PA-C Pharmacy:   [id number] - [place], [place] - [address]OSS [address] [place] [id number] Phone: [phone] Fax: [phone]     Social Determinants of Health (SDOH) Interventions    Readmission Risk Interventions Readmission Risk Prevention Plan 04/13/2021  Transportation Screening Complete  PCP or Specialist  Appt within 5-7 Days Complete  Home Care Screening Complete  Medication Review (RN CM) Complete

## 2021-06-15 NOTE — Evaluation (Signed)
Occupational Therapy Evaluation Patient Details Name: Erin Baker MRN: [id number] DOB: 1940-12-27 Today's Date: 06/15/2021   History of Present Illness 80 y.o. f Admitted on 11/16 due toL sided weakness. 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   Clinical Impression   Pt admitted for concerns listed above. PTA pt reported that she was near baseline at her rehab facility, expecting to discharge home the next day.  At this time, pt presents with increased weakness on her L side and balance deficits. Pt requiring min A for functional mobility and set up to mod A for all ADL's due to balance and cues. Recommend returning to SNF to maximize her return to independence and reduce her fall risk. OT will follow acutely and address concerns listed below.      Recommendations for follow up therapy are one component of a multi-disciplinary discharge planning process, led by the attending physician.  Recommendations may be updated based on patient status, additional functional criteria and insurance authorization.   Follow Up Recommendations  Skilled nursing-short term rehab (<3 hours/day)    Assistance Recommended at Discharge Frequent or constant Supervision/Assistance  Functional Status Assessment  Patient has had a recent decline in their functional status and demonstrates the ability to make significant improvements in function in a reasonable and predictable amount of time.  Equipment Recommendations  None recommended by OT    Recommendations for Other Services       Precautions / Restrictions Precautions Precautions: Fall Restrictions Weight Bearing Restrictions: No      Mobility Bed Mobility Overal bed mobility: Needs Assistance Bed Mobility: Supine to Sit;Sit to Supine     Supine to sit: Min  guard Sit to supine: Min assist   General bed mobility comments: Pt require min a to bring her LLE into bed and min guard to ensure stability when sitting.    Transfers Overall transfer level: Needs assistance Equipment used: Rolling [place] (2 wheels) Transfers: Sit to/from Stand Sit to Stand: Min assist           General transfer comment: Min assist to power up and stady      Balance Overall balance assessment: Needs assistance Sitting-balance support: No upper extremity supported;Feet supported Sitting balance-[name] Scale: Good     Standing balance support: Reliant on assistive device for balance Standing balance-[name] Scale: Poor Standing balance comment: Pt with heavy reliance on UE supported to maintain balance                           ADL either performed or assessed with clinical judgement   ADL Overall ADL's : Needs assistance/impaired Eating/Feeding: Set up;Sitting   Grooming: Set up;Sitting   Upper Body Bathing: Min guard;Sitting   Lower Body Bathing: Moderate assistance;Sitting/lateral leans;Sit to/from stand   Upper Body Dressing : Set up;Sitting   Lower Body Dressing: Moderate assistance;Sitting/lateral leans;Sit to/from stand   Toilet Transfer: Minimal assistance;Ambulation   Toileting- Clothing Manipulation and Hygiene: Minimal assistance;Sitting/lateral lean;Sit to/from stand       Functional mobility during ADLs: Minimal assistance;Rolling [place] (2 wheels) General ADL Comments: Pt presents with increased balance deficits and L sided weakness, requiring min assist for ADL's and functional mobility     Vision Baseline Vision/History: 1 Wears glasses Ability to See in Adequate Light: 0  Adequate Patient Visual Report: No change from baseline Vision Assessment?: No apparent visual deficits     Perception     Praxis      Pertinent Vitals/Pain Pain Assessment: No/denies pain     Hand Dominance Right   Extremity/Trunk  Assessment Upper Extremity Assessment Upper Extremity Assessment: Generalized weakness;LUE deficits/detail;RUE deficits/detail RUE Deficits / Details: ROM WFL, MMT generalized 4/5 RUE Sensation: WNL RUE Coordination: WNL LUE Deficits / Details: Slowed movements, MMT generalize 3+/5-4-/5 LUE Sensation: decreased light touch LUE Coordination: decreased fine motor   Lower Extremity Assessment Lower Extremity Assessment: Defer to PT evaluation   Cervical / Trunk Assessment Cervical / Trunk Assessment: Kyphotic   Communication Communication Communication: No difficulties   Cognition Arousal/Alertness: Awake/alert Behavior During Therapy: WFL for tasks assessed/performed Overall Cognitive Status: Within Functional Limits for tasks assessed                                 General Comments: Pt has had multiple strokes, will benefit from further functional assessing of her cognition.     General Comments  VSS    Exercises     Shoulder Instructions      Home Living Family/patient expects to be discharged to:: Skilled nursing facility                                 Additional Comments: Pt recently admitted for CVA in 10/22, was at [facility] facility, getting ready to d/c when she had this CVA.      Prior Functioning/Environment Prior Level of Function : Needs assist             Mobility Comments: Pt requiring use of RW and min guard at Rehab ADLs Comments: Pt requiring minimal assist at Rehab        OT Problem List: Decreased strength;Decreased range of motion;Decreased activity tolerance;Impaired balance (sitting and/or standing);Decreased coordination;Decreased safety awareness;Decreased knowledge of use of DME or AE;Impaired UE functional use      OT Treatment/Interventions: Self-care/ADL training;Therapeutic exercise;Energy conservation;Therapeutic activities;Patient/family education;Balance training    OT Goals(Current goals can be  found in the care plan section) Acute Rehab OT Goals Patient Stated Goal: To be independent again OT Goal Formulation: With patient Time For Goal Achievement: 06/29/21 Potential to Achieve Goals: Good ADL Goals Pt Will Perform Grooming: with modified independence;standing Pt Will Perform Lower Body Bathing: with modified independence;sitting/lateral leans;sit to/from stand Pt Will Perform Lower Body Dressing: with modified independence;sitting/lateral leans;sit to/from stand Pt Will Perform Toileting - Clothing Manipulation and hygiene: with modified independence;sitting/lateral leans;sit to/from stand Pt Will Perform Tub/Shower Transfer: with supervision;ambulating Additional ADL Goal #1: Pt will complete medication management task with no errors. Additional ADL Goal #2: Pt will maintain standing balance for 3 mins while completing a dynamic standing activity.  OT Frequency: Min 2X/week   Barriers to D/C:    Pt lives alone       Co-evaluation              AM-PAC OT "6 Clicks" Daily Activity     Outcome Measure Help from another person eating meals?: A Little Help from another person taking care of personal grooming?: A Little Help from another person toileting, which includes using toliet, bedpan, or urinal?: A Lot Help from another person bathing (including washing, rinsing, drying)?: A Lot Help from another person to put on and  taking off regular upper body clothing?: A Little Help from another person to put on and taking off regular lower body clothing?: A Lot 6 Click Score: 15   End of Session Equipment Utilized During Treatment: Gait belt;Rolling [place] (2 wheels) Nurse Communication: Mobility status  Activity Tolerance: Patient tolerated treatment well Patient left: in bed;with call bell/phone within reach;with bed alarm set  OT Visit Diagnosis: Unsteadiness on feet (R26.81);Other abnormalities of gait and mobility (R26.89);Muscle weakness (generalized) (M62.81)                 Time: 9292-4462 OT Time Calculation (min): 25 min Charges:  OT General Charges $OT Visit: 1 Visit OT Evaluation $OT Eval Moderate Complexity: 1 Mod OT Treatments $Self Care/Home Management : 8-22 mins  Clarise Chacko H., OTR/L Acute Rehabilitation  Jakeob Tullis Elane Ervin Hensley 06/15/2021, 11:03 AM

## 2021-06-15 NOTE — Progress Notes (Signed)
Physical Therapy Treatment Patient Details Name: Erin Baker MRN: [id number] DOB: [birth year] Today's Date: 06/15/2021   History of Present Illness Pt is a 80 y/o F Admitted on 11/16 due to L sided weakness. 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    PT Comments    Seen for second session to further assess strength and endurance. In LLE, pt with difficulty achieving full knee extension for MMT. 3+ bordering 4-/5 strength in quads/hip flexors. 4+/5 strength in hamstrings.  Pt endorses continued N/T in L hand which she states is her new baseline since the prior stroke. She continues to demonstrate decreased tolerance for functional activity, with a walk to/from the bathroom being her max distance at this time. Will continue to follow and progress as able per POC.    Recommendations for follow up therapy are one component of a multi-disciplinary discharge planning process, led by the attending physician.  Recommendations may be updated based on patient status, additional functional criteria and insurance authorization.  Follow Up Recommendations  Skilled nursing-short term rehab (<3 hours/day)     Assistance Recommended at Discharge Frequent or constant Supervision/Assistance  Equipment Recommendations  None recommended by PT (TBD by next venue of care)    Recommendations for Other Services       Precautions / Restrictions Precautions Precautions: Fall Precaution Comments: L side weakness Restrictions Weight Bearing Restrictions: No     Mobility  Bed Mobility Overal bed mobility: Needs Assistance Bed Mobility: Sit to Supine     Supine to sit: Supervision Sit to supine: Supervision   General bed mobility comments: Pt was able to transition to supine with supervision. Appeared rushed due to  fatigue and eager to lay back down.    Transfers Overall transfer level: Needs assistance Equipment used: Rolling [place] (2 wheels) Transfers: Sit to/from Stand Sit to Stand: Min guard           General transfer comment: Close guard for safety as pt powered up to full stand. No overt LOB.    Ambulation/Gait Ambulation/Gait assistance: Min assist;Min guard Gait Distance (Feet): 25 Feet Assistive device: Rolling [place] (2 wheels) Gait Pattern/deviations: Shuffle;Decreased dorsiflexion - left;Decreased stance time - left;Decreased weight shift to left Gait velocity: Decreased Gait velocity interpretation: <1.31 ft/sec, indicative of household ambulator   General Gait Details: Continues to fatigue quickly, requiring increased assist as pt fatigues. Short standing rest break at the sink after bathroom use and unable to remain standing to wash her hands.   Stairs             Wheelchair Mobility    Modified Rankin (Stroke Patients Only) Modified Rankin (Stroke Patients Only) Pre-Morbid Rankin Score: Moderately severe disability Modified Rankin: Moderately severe disability     Balance Overall balance assessment: Needs assistance Sitting-balance support: No upper extremity supported;Feet supported Sitting balance-[name] Scale: Good     Standing balance support: Reliant on assistive device for balance Standing balance-[name] Scale: Poor Standing balance comment: Pt with heavy reliance on UE supported to maintain balance                            Cognition Arousal/Alertness: Awake/alert Behavior During Therapy: WFL for tasks assessed/performed Overall Cognitive Status: Impaired/Different from baseline Area of Impairment: Safety/judgement  Safety/Judgement: Decreased awareness of safety;Decreased awareness of deficits     General Comments: Pt has had multiple strokes, will benefit from further functional assessing of her  cognition.        Exercises      General Comments        Pertinent Vitals/Pain Pain Assessment: No/denies pain    Home Living Family/patient expects to be discharged to:: Skilled nursing facility                   Additional Comments: Pt recently admitted for CVA in 10/22, was at [facility] facility, getting ready to d/c when she had this CVA.    Prior Function            PT Goals (current goals can now be found in the care plan section) Acute Rehab PT Goals Patient Stated Goal: Return to SNF but a different location PT Goal Formulation: With patient Time For Goal Achievement: 06/29/21 Potential to Achieve Goals: Good Progress towards PT goals: Progressing toward goals    Frequency    Min 3X/week      PT Plan Current plan remains appropriate    Co-evaluation              AM-PAC PT "6 Clicks" Mobility   Outcome Measure  Help needed turning from your back to your side while in a flat bed without using bedrails?: None Help needed moving from lying on your back to sitting on the side of a flat bed without using bedrails?: A Little Help needed moving to and from a bed to a chair (including a wheelchair)?: A Little Help needed standing up from a chair using your arms (e.g., wheelchair or bedside chair)?: A Little Help needed to walk in hospital room?: A Little Help needed climbing 3-5 steps with a railing? : A Lot 6 Click Score: 18    End of Session Equipment Utilized During Treatment: Gait belt Activity Tolerance: Patient tolerated treatment well Patient left: in chair;with call bell/phone within reach;with chair alarm set Nurse Communication: Mobility status PT Visit Diagnosis: Unsteadiness on feet (R26.81);Other symptoms and signs involving the nervous system (O13.086)     Time: 5784-6962 PT Time Calculation (min) (ACUTE ONLY): 16 min  Charges:  $Gait Training: 8-22 mins                     Conni Slipper, PT, DPT Acute Rehabilitation  Services Pager: [phone] Office: [phone]    Marylynn Pearson 06/15/2021, 3:19 PM

## 2021-06-15 NOTE — Evaluation (Signed)
Physical Therapy Evaluation  Patient Details Name: Erin Baker MRN: [id number] DOB: 22-Jun-1941 Today's Date: 06/15/2021  History of Present Illness  Pt is a 80 y/o F Admitted on 11/16 due to L sided weakness. 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   Clinical Impression  Pt admitted with above diagnosis. Pt currently with functional limitations due to the deficits listed below (see PT Problem List). At the time of PT eval pt was able to perform transfers and ambulation in room only due to fatigue. As pt fatigued, she required increased assistance, progressing to needing heavy min assist by end of session. She is agreeable to return to SNF level rehab for continued therapies. Acutely, pt will benefit from skilled PT to increase their independence and safety with mobility to allow discharge to the venue listed below.          Recommendations for follow up therapy are one component of a multi-disciplinary discharge planning process, led by the attending physician.  Recommendations may be updated based on patient status, additional functional criteria and insurance authorization.  Follow Up Recommendations Skilled nursing-short term rehab (<3 hours/day)    Assistance Recommended at Discharge Frequent or constant Supervision/Assistance  Functional Status Assessment Patient has had a recent decline in their functional status and demonstrates the ability to make significant improvements in function in a reasonable and predictable amount of time.  Equipment Recommendations  None recommended by PT (TBD by next venue of care)    Recommendations for Other Services       Precautions / Restrictions Precautions Precautions: Fall Precaution Comments: L side weakness Restrictions Weight Bearing Restrictions: No       Mobility  Bed Mobility Overal bed mobility: Needs Assistance Bed Mobility: Supine to Sit;Sit to Supine     Supine to sit: Supervision Sit to supine: Supervision   General bed mobility comments: No assist for pt to transition to/from EOB however increased time and effort required to complete.    Transfers Overall transfer level: Needs assistance Equipment used: Rolling [place] (2 wheels) Transfers: Sit to/from Stand Sit to Stand: Min assist           General transfer comment: Min assist to power up and steady.    Ambulation/Gait Ambulation/Gait assistance: Min assist;Min guard Gait Distance (Feet): 25 Feet Assistive device: Rolling [place]k (2 wheels) Gait Pattern/deviations: Shuffle;Decreased dorsiflexion - left;Decreased stance time - left;Decreased weight shift to left Gait velocity: Decreased Gait velocity interpretation: <1.31 ft/sec, indicative of household ambulator   General Gait Details: Ambulation around room with RW. Initially pt ambulated to bathroom with min guard assist/occasional min assist for managing [place] through tight space in bathroom. Later in session pt ambulated around bed to chair and required consistent heavy min assist for balance support, safety, and [place] management.  Stairs            Wheelchair Mobility    Modified Rankin (Stroke Patients Only) Modified Rankin (Stroke Patients Only) Pre-Morbid Rankin Score: Moderately severe disability Modified Rankin: Moderately severe disability     Balance Overall balance assessment: Needs assistance Sitting-balance support: No upper extremity supported;Feet supported Sitting balance-[name] Scale: Good     Standing balance support: Reliant on assistive device for balance Standing balance-[name] Scale: Poor Standing balance comment: Pt with heavy reliance on UE supported to maintain balance  Pertinent Vitals/Pain Pain Assessment: No/denies pain     Home Living Family/patient expects to be discharged to:: Skilled nursing facility                   Additional Comments: Pt recently admitted for CVA in 10/22, was at [facility] facility, getting ready to d/c when she had this CVA.    Prior Function Prior Level of Function : Needs assist             Mobility Comments: Pt requiring use of RW and min guard at Rehab ADLs Comments: Pt requiring minimal assist at Rehab     Hand Dominance   Dominant Hand: Right    Extremity/Trunk Assessment   Upper Extremity Assessment Upper Extremity Assessment: Generalized weakness RUE Deficits / Details: ROM WFL, MMT generalized 4/5 RUE Sensation: WNL RUE Coordination: WNL LUE Deficits / Details: Slowed movements, MMT generalize 3+/5-4-/5 LUE Sensation: decreased light touch LUE Coordination: decreased fine motor    Lower Extremity Assessment Lower Extremity Assessment: Generalized weakness;LLE deficits/detail LLE Deficits / Details: 4- to 4/5 strength in quads, hamstrings, hip flexors. Fatigued quickly with activity.    Cervical / Trunk Assessment Cervical / Trunk Assessment: Kyphotic (Forward head posture with rounded shoulders)  Communication   Communication: No difficulties  Cognition Arousal/Alertness: Awake/alert Behavior During Therapy: WFL for tasks assessed/performed Overall Cognitive Status: Impaired/Different from baseline Area of Impairment: Safety/judgement                         Safety/Judgement: Decreased awareness of safety;Decreased awareness of deficits     General Comments: Pt has had multiple strokes, will benefit from further functional assessing of her cognition.        General Comments General comments (skin integrity, edema, etc.): VSS    Exercises     Assessment/Plan    PT Assessment Patient needs continued PT services  PT Problem List Decreased strength;Decreased activity tolerance;Decreased balance;Decreased  mobility;Decreased knowledge of use of DME;Decreased safety awareness;Decreased knowledge of precautions;Decreased cognition       PT Treatment Interventions DME instruction;Gait training;Functional mobility training;Therapeutic activities;Therapeutic exercise;Neuromuscular re-education;Patient/family education;Cognitive remediation;Balance training    PT Goals (Current goals can be found in the Care Plan section)  Acute Rehab PT Goals Patient Stated Goal: Return to SNF but a different location PT Goal Formulation: With patient Time For Goal Achievement: 06/29/21 Potential to Achieve Goals: Good    Frequency Min 3X/week   Barriers to discharge        Co-evaluation               AM-PAC PT "6 Clicks" Mobility  Outcome Measure Help needed turning from your back to your side while in a flat bed without using bedrails?: None Help needed moving from lying on your back to sitting on the side of a flat bed without using bedrails?: A Little Help needed moving to and from a bed to a chair (including a wheelchair)?: A Little Help needed standing up from a chair using your arms (e.g., wheelchair or bedside chair)?: A Little Help needed to walk in hospital room?: A Little Help needed climbing 3-5 steps with a railing? : A Lot 6 Click Score: 18    End of Session Equipment Utilized During Treatment: Gait belt Activity Tolerance: Patient tolerated treatment well Patient left: in chair;with call bell/phone within reach;with chair alarm set Nurse Communication: Mobility status PT Visit Diagnosis: Unsteadiness on feet (R26.81);Other symptoms and signs involving the nervous system (  C80.223)    Time: 3612-2449 PT Time Calculation (min) (ACUTE ONLY): 23 min   Charges:   PT Evaluation $PT Eval Moderate Complexity: 1 Mod PT Treatments $Gait Training: 8-22 mins        Conni Slipper, PT, DPT Acute Rehabilitation Services Pager: [phone] Office: [phone]   Marylynn Pearson 06/15/2021, 1:34 PM

## 2021-06-15 NOTE — [place] FL2 (Signed)
[place] MEDICAID FL2 LEVEL OF CARE SCREENING TOOL     IDENTIFICATION  Patient Name: Erin Baker Birthdate: 1940/11/29 Sex: female Admission Date (Current Location): 06/14/2021  [facility] [place] [facility] [place] Number:  Producer, television/film/video and Address:  [facility], [address], [place], [place] [address]      Provider Number: [id number]  Attending Physician Name and Address:  Lanae Boast, MD  Relative Name and Phone Number:       Current Level of Care: Hospital Recommended Level of Care: Skilled Nursing Facility Prior Approval Number:    Date Approved/Denied:   PASRR Number: [id number]  Discharge Plan: SNF    Current Diagnoses: Patient Active Problem List   Diagnosis Date Noted   Acute ischemic stroke (HCC) 06/14/2021   Ascending aortic aneurysm 04/13/2021   Hypothyroidism 04/13/2021   Carotid stenosis, right 04/12/2021   CAD (coronary artery disease) 04/05/2021   Hypertension 04/05/2021   Normocytic anemia 04/05/2021   Type 2 diabetes mellitus with vascular disease (HCC) 04/05/2021   Cerebral embolism with cerebral infarction 04/05/2021    Orientation RESPIRATION BLADDER Height & Weight     Self, Time, Situation, Place  Normal Incontinent Weight: 72.6 kg Height:  5' 5.5" (166.4 cm)  BEHAVIORAL SYMPTOMS/MOOD NEUROLOGICAL BOWEL NUTRITION STATUS      Continent Diet (Carb modified with thin liquids)  AMBULATORY STATUS COMMUNICATION OF NEEDS Skin   Extensive Assist Verbally Normal                       Personal Care Assistance Level of Assistance  Bathing, Feeding, Dressing Bathing Assistance: Limited assistance Feeding assistance: Independent Dressing Assistance: Limited assistance     Functional Limitations Info  Sight, Hearing, Speech Sight Info: Adequate Hearing Info: Adequate Speech Info: Adequate    SPECIAL CARE FACTORS FREQUENCY  PT (By licensed PT), OT (By licensed OT), Speech therapy     PT Frequency: 5x/wk OT  Frequency: 5x/wk     Speech Therapy Frequency: 5x/wk      Contractures Contractures Info: Not present    Additional Factors Info  Code Status, Allergies, Psychotropic, Insulin Sliding Scale Code Status Info: Full Allergies Info: oxycodone-acetaminophen/ macrobid/ morphine/ neurontin/ sulfa antibiotics/ tape/ tramadol/ latex/ levofloxacin Psychotropic Info: Zoloft 25 mg daily Insulin Sliding Scale Info: Novolog 0-9 units SQ three times a day/ Levemir 10 units SQ at bedtime       Current Medications (06/15/2021):  This is the current hospital active medication list Current Facility-Administered Medications  Medication Dose Route Frequency Provider Last Rate Last Admin   acetaminophen (TYLENOL) tablet 650 mg  650 mg Oral Q6H PRN Howerter, Justin B, DO       Or   acetaminophen (TYLENOL) suppository 650 mg  650 mg Rectal Q6H PRN Howerter, Justin B, DO       aspirin chewable tablet 81 mg  81 mg Oral Daily Howerter, Justin B, DO   81 mg at 06/15/21 0848   atorvastatin (LIPITOR) tablet 40 mg  40 mg Oral Daily Howerter, Justin B, DO   40 mg at 06/15/21 0848   cefTRIAXone (ROCEPHIN) 1 g in sodium chloride 0.9 % 100 mL IVPB  1 g Intravenous Q24H Kc, Ramesh, MD       ferrous sulfate tablet 325 mg  325 mg Oral Q breakfast Howerter, Justin B, DO   325 mg at 06/15/21 0847   insulin aspart (novoLOG) injection 0-9 Units  0-9 Units Subcutaneous TID WC Howerter, Justin B, DO  3 Units at 06/15/21 0921   insulin detemir (LEVEMIR) injection 10 Units  10 Units Subcutaneous QHS Howerter, Justin B, DO   10 Units at 06/15/21 0056   levothyroxine (SYNTHROID) tablet 100 mcg  100 mcg Oral Daily Howerter, Justin B, DO   100 mcg at 06/15/21 0604   magnesium oxide (MAG-OX) tablet 400 mg  400 mg Oral BID Kc, Dayna Barker, MD   400 mg at 06/15/21 0920   metoprolol tartrate (LOPRESSOR) tablet 25 mg  25 mg Oral BID Howerter, Justin B, DO   25 mg at 06/15/21 0848   [START ON 06/16/2021] pantoprazole (PROTONIX) EC tablet 40  mg  40 mg Oral BID Howerter, Justin B, DO       sertraline (ZOLOFT) tablet 25 mg  25 mg Oral Daily Howerter, Justin B, DO   25 mg at 06/15/21 0847   umeclidinium bromide (INCRUSE ELLIPTA) 62.5 MCG/ACT 1 puff  1 puff Inhalation Daily Howerter, Justin B, DO   1 puff at 06/15/21 0912     Discharge Medications: Please see discharge summary for a list of discharge medications.  Relevant Imaging Results:  Relevant Lab Results:   Additional Information SS#: [id number]  Kermit Balo, RN

## 2021-06-15 NOTE — Progress Notes (Addendum)
STROKE TEAM PROGRESS NOTE   ATTENDING NOTE: I reviewed above note and agree with the assessment and plan. Pt was seen and examined.   80 year old female with history of AAA status post repair, diabetes, hypertension, CAD status post stent, status post left subclavian artery bypass, OSA, stroke in 03/2021 status post right TCAR admitted for left lower extremity weakness.  Patient had left ACA stroke in the past and loop recorder placed in 2019 showed no A. fib in 3+ years of recording, currently loop recorder out of battery.  In 03/2021 patient admitted for aphasia lasted 1 hour and resolved.  MRI showed right frontal cortical small infarct.  CTA head and neck showed right ICA 50% and left ICA 40% stenosis, however carotid Doppler showed right ICA 60 to 79% stenosis.  EF 60 to 65%.  A1c 7.7, LDL 79.  Status post right TCAR with Dr. Carlis Abbott.  Discharged on DAPT and Lipitor 80.  However, patient only on aspirin at home and Plavix have been discontinued and she was not sure how long she had been on plavix.  Since last discharge, patient continues to have generalized weakness, lethargy, which was contributing to progressive anemia.  2 days ago patient had acute onset left lower extremity weakness which promoted for the current admission.  CT no acute finding, MRI showed right frontal cortical small MCA/ACA infarct.  CTA head and neck pending.  LDL 51, A1c pending.  On exam, patient awake alert, orientated x3, no aphasia, fluent language, follows some commands.  Cranial nerves intact, muscle strength showed left lower extremity 2+/5 proximal, knee flexion 4 -/5, ankle DF and PF 4+/5.  Sensation symmetrical, finger-to-nose bilaterally intact.  Etiology for patient current stroke not quite clear, need to follow-up CT head and neck to rule out right carotid stenosis post TCAR.  Small vessel disease still possible.  Less likely cardioembolic given loop recorder 3+ years no A. fib.  Continue aspirin 81 for now given  severe anemia.  Discussed with primary team regarding anemia work-up.  Per patient, he had anemia work-up in the past with colonoscopy and EGD but no positive finding. She denies any active bleeding at home.  Continue Lipitor 40.  Will follow.  For detailed assessment and plan, please refer to above as I have made changes wherever appropriate.   Rosalin Hawking, MD PhD Stroke Neurology 06/15/2021 3:51 PM  I discussed with Dr. [name]. I spent  35 minutes in total face-to-face time with the patient, more than 50% of which was spent in counseling and coordination of care, reviewing test results, images and medication, and discussing the diagnosis, treatment plan and potential prognosis. This patient's care requiresreview of multiple databases, neurological assessment, discussion with family, other specialists and medical decision making of high complexity.      INTERVAL HISTORY No one is at the bedside at time of this exam.   Vitals:   06/15/21 0124 06/15/21 0330 06/15/21 0601 06/15/21 0752  BP: (!) 121/56 (!) 122/54 (!) 145/65 (!) 121/52  Pulse: 80 69 68 71  Resp: 18 20 16 18   Temp: 98.1 F (36.7 C) 98 F (36.7 C) 98.3 F (36.8 C) 97.9 F (36.6 C)  TempSrc: Oral Oral Oral Oral  SpO2: 93% 94% 99% 100%  Weight: 72.6 kg     Height: 5' 5.5" (1.664 m)      CBC:  Recent Labs  Lab 06/14/21 1529 06/14/21 1545 06/15/21 0258  WBC 10.0  --  8.5  NEUTROABS 7.4  --   --  HGB 7.8* 8.5* 7.0*  HCT 27.2* 25.0* 24.3*  MCV 85.3  --  83.5  PLT 230  --  215   Basic Metabolic Panel:  Recent Labs  Lab 06/14/21 1529 06/14/21 1545 06/14/21 2156 06/15/21 0258  NA 136 138  --  136  K 3.8 3.8  --  3.4*  CL 101 101  --  103  CO2 26  --   --  23  GLUCOSE 177* 173*  --  270*  BUN 23 24*  --  22  CREATININE 1.41* 1.50*  --  1.15*  CALCIUM 8.6*  --   --  8.3*  MG  --   --  1.7 1.6*    Lipid Panel:  Recent Labs  Lab 06/15/21 0258  CHOL 97  TRIG 83  HDL 29*  CHOLHDL 3.3  VLDL 17  LDLCALC 51     HgbA1c: No results for input(s): HGBA1C in the last 168 hours. Urine Drug Screen: No results for input(s): LABOPIA, COCAINSCRNUR, LABBENZ, AMPHETMU, THCU, LABBARB in the last 168 hours.  Alcohol Level No results for input(s): ETH in the last 168 hours.  IMAGING past 24 hours CT HEAD WO CONTRAST  Result Date: 06/14/2021 CLINICAL DATA:  Neuro deficit, acute, stroke suspected new onset of left leg weakness over night. EXAM: CT HEAD WITHOUT CONTRAST TECHNIQUE: Contiguous axial images were obtained from the base of the skull through the vertex without intravenous contrast. COMPARISON:  MRI 04/05/2021 FINDINGS: Brain: There are multiple old infarctions scattered throughout the brain. No abnormality seen affecting the brainstem. Multiple old bilateral cerebellar infarctions. Cerebral hemispheres show widespread chronic small-vessel ischemic changes of the white matter and numerous old cortical and subcortical infarctions, more numerous in the right hemisphere than the left. None of these are identifiably acute when compared to the studies of earlier this year. No mass, hemorrhage, hydrocephalus or extra-axial collection. Vascular: There is atherosclerotic calcification of the major vessels at the base of the brain. Skull: Negative Sinuses/Orbits: Clear/normal Other: None IMPRESSION: No acute finding by CT. Extensive old ischemic changes throughout the cerebellum and cerebral hemispheres as outlined above, without identifiable acute infarction or hemorrhage. Certainly, a small acute insult could be hidden within the extensive chronic changes. Electronically Signed   By: Paulina Fusi M.D.   On: 06/14/2021 16:16   MR BRAIN WO CONTRAST  Result Date: 06/14/2021 CLINICAL DATA:  Acute neurologic deficit EXAM: MRI HEAD WITHOUT CONTRAST TECHNIQUE: Multiplanar, multiecho pulse sequences of the brain and surrounding structures were obtained without intravenous contrast. COMPARISON:  None. FINDINGS: Brain: Small  focus of acute ischemia within the right corona radiata. No acute or chronic hemorrhage. Multiple old infarcts of the cerebellum, right occipital lobe, right frontal lobe and the corona radiata/centrum semiovale. Diffuse generalized atrophy. Confluent white matter hyperintensity. The midline structures are normal. Vascular: Major flow voids are preserved. Skull and upper cervical spine: Normal calvarium and skull base. Visualized upper cervical spine and soft tissues are normal. Sinuses/Orbits:No paranasal sinus fluid levels or advanced mucosal thickening. No mastoid or middle ear effusion. Normal orbits. IMPRESSION: 1. Small focus of acute ischemia within the right corona radiata. No hemorrhage or mass effect. 2. Multiple old infarcts and findings of chronic ischemic microangiopathy. 3.  Cerebral Atrophy (ICD10-G31.9). Electronically Signed   By: Deatra Robinson M.D.   On: 06/14/2021 20:22    PHYSICAL EXAM  Mental Status: Awake and alert. Oriented  Speech fluent with intact naming and comprehension. Occasional subtle dysarthria is noted. Good insight. Pleasant and  cooperative.  Cranial Nerves: II: Temporal visual fields intact with no extinction to DSS. PERRL.   III,IV, VI: No ptosis. EOMI. No nystagmus.  V,VII: Smile symmetric, facial temp sensation equal bilaterally VIII: hearing intact to voice IX,X: No hypophonia XI: Symmetric XII: Midline tongue extension Motor: BUE 4+/5 proximally and distally. No pronator drift.  RLE 4+/5 proximally and distally LLE 2/5 HF, 3/5 KE and KF, 4-/5 ADF/APF Sensory: FT intact x 4. Temp sensation equal to BLE. Temp sensation decreased to LUE. No extinction to DSS.  Deep Tendon Reflexes: Mildly hyperactive left sided reflexes, including low amplitude patellar with crossed adductor response. Toes downgoing bilaterally  Cerebellar: No ataxia with FNF bilaterally  Gait: Deferred  ASSESSMENT/PLAN Ms. Dannely Henderson is a 80 y.o. female with history of aortic aneurysm,  DM, HTN, multiple prior strokes, right transcarotid artery revascularization in September of this year and CAD who presents to the hospital with new onset of LLE weakness. LKN was at midnight. She woke up at about 5 AM with limited mobility and weakness of her LLE which was noticed when as she walked to the bathroom at her rehab center.  She felt as though her leg was dragging.. She has chronic stroke-related deficits of left hand numbness and slurred speech. She is ambulatory at baseline. Home medications include ASA and atorvastatin. Was also on Plavix until about 2 months ago.    MRI brain revealed a small acute infarction within the right corona radiata.  Additionally patient has developed iron deficiency anemia and hemoglobin today is 7.0, down from 9.6 On 05/22/21.   Stroke:  right MCA/ACA small infarct embolic pattern, source unclear, small vs. Large vessel disease Code Stroke   CT head No acute abnormality.  Small vessel disease. Atrophy.  ASPECTS 10.     CTA head & neck pending MRI  Brain:  1. Small focus of acute ischemia within the right corona radiata. No hemorrhage or mass effect. 2. Multiple old infarcts and findings of chronic ischemic microangiopathy. 3.  Cerebral Atrophy  2D Echo EF 60%, aortic dilatation, normal LA size, no IA shunt   LDL 51 HgbA1c pending VTE prophylaxis - scd     Diet   Diet Carb Modified Fluid consistency: Thin; Room service appropriate? Yes   aspirin 81 mg daily prior to admission, now on aspirin 81 mg daily only given severe anemia Therapy recommendations:  SNF Disposition:  pending   Hypertension Home meds:  metoprolol Stable Permissive hypertension (OK if < 220/120) but gradually normalize in 5-7 days Long-term BP goal normotensive  Hyperlipidemia Home meds:  lipitor 40mg ,  resumed in hospital LDL 51, goal < 70 Continue statin at discharge  Diabetes type II Uncontrolled Home meds:  glipizide HgbA1c 7.7, goal < 7.0 CBGs SSI  Other  Stroke Risk Factors  Advanced Age >/= 68  Hx stroke/TIA CHRONIC DIASTOLOIC HEART FAILURE   Other Active Problems Asymptomatic pyuria Hypothyroidism Chronic iron deficiency anemia  gerd  Hospital day # 0     To contact Stroke Continuity provider, please refer to [email or web address] After hours, contact General Neurology

## 2021-06-15 NOTE — Progress Notes (Addendum)
Inpatient Diabetes Program Recommendations  AACE/ADA: New Consensus Statement on Inpatient Glycemic Control (2015)  Target Ranges:  Prepandial:   less than 140 mg/dL      Peak postprandial:   less than 180 mg/dL (1-2 hours)      Critically ill patients:  140 - 180 mg/dL   Lab Results  Component Value Date   GLUCAP 130 (H) 06/15/2021   HGBA1C 7.7 (H) 04/05/2021    Review of Glycemic Control  Latest Reference Range & Units 06/14/21 22:39 06/15/21 00:54 06/15/21 09:04 06/15/21 11:51  Glucose-Capillary 70 - 99 mg/dL 85 023 (H) 343 (H) 568 (H)   Diabetes history:  Outpatient Diabetes medications:  Levemir 27 units q HS Glucotrol XL 10 mg q AM Current orders for Inpatient glycemic control:  Novolog sensitive tid with meals Inpatient Diabetes Program Recommendations:   Please increase Levemir to 15 units daily.   Thanks,  Beryl Meager, RN, BC-ADM Inpatient Diabetes Coordinator Pager [phone]  (8a-5p)

## 2021-06-15 NOTE — Progress Notes (Signed)
PROGRESS NOTE    Erin Baker  [id number] DOB: 09/15/1940 DOA: 06/14/2021 PCP: Kathreen Devoid, PA-C    Brief Narrative/Hospital Course:  Erin Baker, 80 y.o. female with PMH of  previous acute ischemic CVAs, hypertension, hypo-thyroidism, chronic iron deficiency anemia with baseline hemoglobin 8.5-10 g, chronic diastolic CHF presenting with left lower extremity weakness after she woke up at 5 AM 11/16 noticed new onset of left lower extremity weakness. She was brought to the ED work-up showed acute ischemic stroke as below also with anemia, neurology was consulted and patient was admitted for further management.  MRI brain:Small focus of acute ischemia within the right corona radiata. No hemorrhage or mass effect. 2. Multiple old infarcts and findings of chronic ischemic microangiopathy. 3.  Cerebral Atrophy  Subjective: Overnight blood pressure stable doing well on room air, potassium at 3.4 creatinine improved from 1.5-1.1, hemoglobin 7.0 g. UA abnormal WBC more than 50 with large leukocytes  C/o left leg cramps- moving LLE better today but still weak  Assessment & Plan:  Acute ischemic stroke: MRI showing acute ischemia in the right corona radiata patient presented with left leg weakness.  Neurology on consult, LDL stable at 51, follow-up HbA1c, completed stroke work-up per neurology with CT angio head neck.  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  On aspirin 81 Lipitor 40.  Await further neurology input.  Hypertension: Allow permissive hypertension.  Continue metoprolol.  Hypokalemia/Hypomagnesemia: Replete  AKI: Creatinine improving monitor Recent Labs  Lab 06/14/21 1529 06/14/21 1545 06/15/21 0258  BUN 23 24* 22  CREATININE 1.41* 1.50* 1.15*     Normocytic anemia/chronic  iron deficiency anemia: Hemoglobin is further downtrending check anemia panel, Hemoccult. She reports that she used to get regular blood transfusions last one 3 yrs ago,used to go to [facility]. Sasys it is mostly low all the time.. repeat h/h today. Agrees for transfusion if needed. Recent Labs  Lab 06/14/21 1529 06/14/21 1545 06/15/21 0258 06/15/21 0916  HGB 7.8* 8.5* 7.0* 7.2*  HCT 27.2* 25.0* 24.3* 25.0*    Type 2 diabetes mellitus with vascular disease: Blood sugar poorly controlled last A1c 7.7.  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  Start on rocephin.  DVT prophylaxis: SCDs Start: 06/14/21 2139 Code Status:   Code Status: Full Code Family Communication: plan of care discussed with patient at bedside. Status is: Admitted as observation Remains hospitalized for ongoing management of acute CVA,Anemia UTI  disposition: Currently not medically stable for discharge. Anticipated Disposition: TBD.  Pending PT OT  Objective: Vitals last 24 hrs: Vitals:   06/15/21 0124 06/15/21 0330 06/15/21 0601 06/15/21 0752  BP: (!) 121/56 (!) 122/54 (!) 145/65 (!) 121/52  Pulse: 80 69 68 71  Resp: 18 20 16 18   Temp: 98.1 F (36.7 C) 98 F (36.7 C) 98.3 F (36.8 C) 97.9 F (36.6 C)  TempSrc: Oral Oral Oral Oral  SpO2: 93% 94% 99% 100%  Weight: 72.6 kg     Height: 5' 5.5" (1.664 m)      Weight change:   Intake/Output Summary (Last  24 hours) at 06/15/2021 1035 Last data filed at 06/15/2021 0601 Gross per 24 hour  Intake 360 ml  Output --  Net 360 ml   Net IO Since Admission: 360 mL [06/15/21 1035]   Physical Examination: General exam: AA0X3, weak,older than stated age. HEENT:Oral mucosa moist, Ear/Nose WNL  grossly,dentition normal. Respiratory system: B/l diminished BS, no use of accessory muscle, non tender. Cardiovascular system: S1 & S2 +,No JVD. Gastrointestinal system: Abdomen soft, NT,ND, BS+. Nervous System:Alert, awake, moving extremities. WEAK lle Extremities: edema none, distal peripheral pulses palpable.  Skin: No rashes, no icterus. MSK: Normal muscle bulk, tone, power.  Medications reviewed:  Scheduled Meds:  aspirin  81 mg Oral Daily   atorvastatin  40 mg Oral Daily   ferrous sulfate  325 mg Oral Q breakfast   insulin aspart  0-9 Units Subcutaneous TID WC   insulin detemir  10 Units Subcutaneous QHS   levothyroxine  100 mcg Oral Daily   magnesium oxide  400 mg Oral BID   metoprolol tartrate  25 mg Oral BID   [START ON 06/16/2021] pantoprazole  40 mg Oral BID   sertraline  25 mg Oral Daily   umeclidinium bromide  1 puff Inhalation Daily   Continuous Infusions:  cefTRIAXone (ROCEPHIN)  IV     Diet Order             Diet Carb Modified Fluid consistency: Thin; Room service appropriate? Yes  Diet effective now                 Weight change:   Wt Readings from Last 3 Encounters:  06/15/21 72.6 kg  04/12/21 74.9 kg     Consultants: neurology Procedures:see note Antimicrobials: Anti-infectives (From admission, onward)    Start     Dose/Rate Route Frequency Ordered Stop   06/15/21 1030  cefTRIAXone (ROCEPHIN) 1 g in sodium chloride 0.9 % 100 mL IVPB        1 g 200 mL/hr over 30 Minutes Intravenous Every 24 hours 06/15/21 0940 06/18/21 1029      Culture/Microbiology    Component Value Date/Time   SDES BLOOD RIGHT HAND 04/06/2021 0104   SPECREQUEST  04/06/2021 0104    BOTTLES DRAWN AEROBIC AND ANAEROBIC Blood Culture adequate volume   CULT  04/06/2021 0104    NO GROWTH 5 DAYS Performed at [facility], [address] [place], [place] [address]    REPTSTATUS 04/11/2021 FINAL 04/06/2021 0104    Other culture-see note  Unresulted Labs (From  admission, onward)     Start     Ordered   06/15/21 0907  Hemoglobin and hematocrit, blood  Now then every 8 hours,   R (with TIMED occurrences)     Question:  Specimen collection method  Answer:  Lab=Lab collect   06/15/21 0906   06/15/21 0845  Vitamin B12  (Anemia Panel (PNL))  Add-on,   AD       Question:  Specimen collection method  Answer:  Lab=Lab collect   06/15/21 0844   06/15/21 0845  Folate  (Anemia Panel (PNL))  Add-on,   AD       Question:  Specimen collection method  Answer:  Lab=Lab collect   06/15/21 0844   06/15/21 0845  Iron and TIBC  (Anemia Panel (PNL))  Add-on,   AD       Question:  Specimen collection method  Answer:  Lab=Lab collect   06/15/21 0844   06/15/21 0845  Ferritin  (Anemia  Panel (PNL))  Add-on,   AD       Question:  Specimen collection method  Answer:  Lab=Lab collect   06/15/21 0844   06/15/21 0845  Reticulocytes  (Anemia Panel (PNL))  Add-on,   AD       Question:  Specimen collection method  Answer:  Lab=Lab collect   06/15/21 0844   06/15/21 0845  Occult blood card to lab, stool  ONCE - STAT,   STAT        06/15/21 0844   06/15/21 0751  Hemoglobin A1c  Add-on,   AD       Question:  Specimen collection method  Answer:  Lab=Lab collect   06/15/21 0751           Data Reviewed: I have personally reviewed following labs and imaging studies CBC: Recent Labs  Lab 06/14/21 1529 06/14/21 1545 06/15/21 0258 06/15/21 0916  WBC 10.0  --  8.5  --   NEUTROABS 7.4  --   --   --   HGB 7.8* 8.5* 7.0* 7.2*  HCT 27.2* 25.0* 24.3* 25.0*  MCV 85.3  --  83.5  --   PLT 230  --  215  --    Basic Metabolic Panel: Recent Labs  Lab 06/14/21 1529 06/14/21 1545 06/14/21 2156 06/15/21 0258  NA 136 138  --  136  K 3.8 3.8  --  3.4*  CL 101 101  --  103  CO2 26  --   --  23  GLUCOSE 177* 173*  --  270*  BUN 23 24*  --  22  CREATININE 1.41* 1.50*  --  1.15*  CALCIUM 8.6*  --   --  8.3*  MG  --   --  1.7 1.6*   GFR: Estimated Creatinine Clearance: 40.7  mL/min (A) (by C-G formula based on SCr of 1.15 mg/dL (H)). Liver Function Tests: Recent Labs  Lab 06/14/21 1529 06/15/21 0258  AST 13* 14*  ALT 12 9  ALKPHOS 90 78  BILITOT 0.9 0.7  PROT 5.9* 5.1*  ALBUMIN 2.7* 2.3*   No results for input(s): LIPASE, AMYLASE in the last 168 hours. No results for input(s): AMMONIA in the last 168 hours. Coagulation Profile: Recent Labs  Lab 06/14/21 1529  INR 1.3*   Cardiac Enzymes: No results for input(s): CKTOTAL, CKMB, CKMBINDEX, TROPONINI in the last 168 hours. BNP (last 3 results) No results for input(s): PROBNP in the last 8760 hours. HbA1C: No results for input(s): HGBA1C in the last 72 hours. CBG: Recent Labs  Lab 06/14/21 2239 06/15/21 0054 06/15/21 0904  GLUCAP 85 214* 202*   Lipid Profile: Recent Labs    06/15/21 0258  CHOL 97  HDL 29*  LDLCALC 51  TRIG 83  CHOLHDL 3.3   Thyroid Function Tests: No results for input(s): TSH, T4TOTAL, FREET4, T3FREE, THYROIDAB in the last 72 hours. Anemia Panel: Recent Labs    06/14/21 2155 06/14/21 2156  VITAMINB12 235  --   FOLATE  --  7.8  FERRITIN 47  --   TIBC 337  --   IRON 20*  --   RETICCTPCT  --  3.0   Sepsis Labs: No results for input(s): PROCALCITON, LATICACIDVEN in the last 168 hours.  Recent Results (from the past 240 hour(s))  Resp Panel by RT-PCR (Flu A&B, Covid) Nasopharyngeal Swab     Status: None   Collection Time: 06/14/21  9:35 PM   Specimen: Nasopharyngeal Swab; Nasopharyngeal(NP) swabs in vial transport  medium  Result Value Ref Range Status   SARS Coronavirus 2 by RT PCR NEGATIVE NEGATIVE Final    Comment: (NOTE) SARS-CoV-2 target nucleic acids are NOT DETECTED.  The SARS-CoV-2 RNA is generally detectable in upper respiratory specimens during the acute phase of infection. The lowest concentration of SARS-CoV-2 viral copies this assay can detect is 138 copies/mL. A negative result does not preclude SARS-Cov-2 infection and should not be used as  the sole basis for treatment or other patient management decisions. A negative result may occur with  improper specimen collection/handling, submission of specimen other than nasopharyngeal swab, presence of viral mutation(s) within the areas targeted by this assay, and inadequate number of viral copies(<138 copies/mL). A negative result must be combined with clinical observations, patient history, and epidemiological information. The expected result is Negative.  Fact Sheet for Patients:  EntrepreneurPulse.com.au  Fact Sheet for Healthcare Providers:  IncredibleEmployment.be  This test is no t yet approved or cleared by the [place] FDA and  has been authorized for detection and/or diagnosis of SARS-CoV-2 by FDA under an Emergency Use Authorization (EUA). This EUA will remain  in effect (meaning this test can be used) for the duration of the COVID-19 declaration under Section 564(b)(1) of the Act, 21 U.S.C.section 360bbb-3(b)(1), unless the authorization is terminated  or revoked sooner.       Influenza A by PCR NEGATIVE NEGATIVE Final   Influenza B by PCR NEGATIVE NEGATIVE Final    Comment: (NOTE) The Xpert Xpress SARS-CoV-2/FLU/RSV plus assay is intended as an aid in the diagnosis of influenza from Nasopharyngeal swab specimens and should not be used as a sole basis for treatment. Nasal washings and aspirates are unacceptable for Xpert Xpress SARS-CoV-2/FLU/RSV testing.  Fact Sheet for Patients: EntrepreneurPulse.com.au  Fact Sheet for Healthcare Providers: IncredibleEmployment.be  This test is not yet approved or cleared by the [place] FDA and has been authorized for detection and/or diagnosis of SARS-CoV-2 by FDA under an Emergency Use Authorization (EUA). This EUA will remain in effect (meaning this test can be used) for the duration of the COVID-19 declaration under Section 564(b)(1) of  the Act, 21 U.S.C. section 360bbb-3(b)(1), unless the authorization is terminated or revoked.  Performed at [facility], [place] [address], [place], [place] [address]      Radiology Studies: CT HEAD WO CONTRAST  Result Date: 06/14/2021 CLINICAL DATA:  Neuro deficit, acute, stroke suspected new onset of left leg weakness over night. EXAM: CT HEAD WITHOUT CONTRAST TECHNIQUE: Contiguous axial images were obtained from the base of the skull through the vertex without intravenous contrast. COMPARISON:  MRI 04/05/2021 FINDINGS: Brain: There are multiple old infarctions scattered throughout the brain. No abnormality seen affecting the brainstem. Multiple old bilateral cerebellar infarctions. Cerebral hemispheres show widespread chronic small-vessel ischemic changes of the white matter and numerous old cortical and subcortical infarctions, more numerous in the right hemisphere than the left. None of these are identifiably acute when compared to the studies of earlier this year. No mass, hemorrhage, hydrocephalus or extra-axial collection. Vascular: There is atherosclerotic calcification of the major vessels at the base of the brain. Skull: Negative Sinuses/Orbits: Clear/normal Other: None IMPRESSION: No acute finding by CT. Extensive old ischemic changes throughout the cerebellum and cerebral hemispheres as outlined above, without identifiable acute infarction or hemorrhage. Certainly, a small acute insult could be hidden within the extensive chronic changes. Electronically Signed   By: Nelson Chimes M.D.   On: 06/14/2021 16:16   MR BRAIN WO  CONTRAST  Result Date: 06/14/2021 CLINICAL DATA:  Acute neurologic deficit EXAM: MRI HEAD WITHOUT CONTRAST TECHNIQUE: Multiplanar, multiecho pulse sequences of the brain and surrounding structures were obtained without intravenous contrast. COMPARISON:  None. FINDINGS: Brain: Small focus of acute ischemia within the right corona radiata. No acute or chronic  hemorrhage. Multiple old infarcts of the cerebellum, right occipital lobe, right frontal lobe and the corona radiata/centrum semiovale. Diffuse generalized atrophy. Confluent white matter hyperintensity. The midline structures are normal. Vascular: Major flow voids are preserved. Skull and upper cervical spine: Normal calvarium and skull base. Visualized upper cervical spine and soft tissues are normal. Sinuses/Orbits:No paranasal sinus fluid levels or advanced mucosal thickening. No mastoid or middle ear effusion. Normal orbits. IMPRESSION: 1. Small focus of acute ischemia within the right corona radiata. No hemorrhage or mass effect. 2. Multiple old infarcts and findings of chronic ischemic microangiopathy. 3.  Cerebral Atrophy (ICD10-G31.9). Electronically Signed   By: Ulyses Jarred M.D.   On: 06/14/2021 20:22     LOS: 0 days   Antonieta Pert, MD [facility]  06/15/2021, 10:35 AM

## 2021-06-15 NOTE — Plan of Care (Signed)
  Problem: Education: Goal: Knowledge of General Education information will improve Description: Including pain rating scale, medication(s)/side effects and non-pharmacologic comfort measures Outcome: Progressing   Problem: Education: Goal: Knowledge of disease or condition will improve Outcome: Progressing Goal: Knowledge of secondary prevention will improve (SELECT ALL) Outcome: Progressing Goal: Knowledge of patient specific risk factors will improve (INDIVIDUALIZE FOR PATIENT) Outcome: Progressing   Problem: Coping: Goal: Will verbalize positive feelings about self Outcome: Progressing Goal: Will identify appropriate support needs Outcome: Progressing   Problem: Ischemic Stroke/TIA Tissue Perfusion: Goal: Complications of ischemic stroke/TIA will be minimized Outcome: Progressing

## 2021-06-16 DIAGNOSIS — I639 Cerebral infarction, unspecified: Secondary | ICD-10-CM | POA: Diagnosis not present

## 2021-06-16 LAB — HEMOCCULT GUIAC POC 1CARD (OFFICE): Fecal Occult Blood, POC: NEGATIVE — NL

## 2021-06-16 LAB — CBC
HCT: 24.4 % — ABNORMAL LOW (ref 36.0–46.0)
Hemoglobin: 7.2 g/dL — ABNORMAL LOW (ref 12.0–15.0)
MCH: 24.6 pg — ABNORMAL LOW (ref 26.0–34.0)
MCHC: 29.5 g/dL — ABNORMAL LOW (ref 30.0–36.0)
MCV: 83.3 fL (ref 80.0–100.0)
Platelets: 227 10*3/uL (ref 150–400)
RBC: 2.93 MIL/uL — ABNORMAL LOW (ref 3.87–5.11)
RDW: 17.7 % — ABNORMAL HIGH (ref 11.5–15.5)
WBC: 7.5 10*3/uL (ref 4.0–10.5)
nRBC: 0 % (ref 0.0–0.2)

## 2021-06-16 LAB — PREPARE RBC (CROSSMATCH)

## 2021-06-16 LAB — BASIC METABOLIC PANEL
Anion gap: 7 (ref 5–15)
BUN: 21 mg/dL (ref 8–23)
CO2: 26 mmol/L (ref 22–32)
Calcium: 8.4 mg/dL — ABNORMAL LOW (ref 8.9–10.3)
Chloride: 104 mmol/L (ref 98–111)
Creatinine, Ser: 1.29 mg/dL — ABNORMAL HIGH (ref 0.44–1.00)
GFR, Estimated: 42 mL/min — ABNORMAL LOW (ref >60)
Glucose, Bld: 136 mg/dL — ABNORMAL HIGH (ref 70–99)
Potassium: 3.9 mmol/L (ref 3.5–5.1)
Sodium: 137 mmol/L (ref 135–145)

## 2021-06-16 LAB — HEMOGLOBIN AND HEMATOCRIT, BLOOD
HCT: 30.2 % — ABNORMAL LOW (ref 36.0–46.0)
Hemoglobin: 9.3 g/dL — ABNORMAL LOW (ref 12.0–15.0)

## 2021-06-16 LAB — GLUCOSE, CAPILLARY
Glucose-Capillary: 116 mg/dL — ABNORMAL HIGH (ref 70–99)
Glucose-Capillary: 164 mg/dL — ABNORMAL HIGH (ref 70–99)
Glucose-Capillary: 160 mg/dL — ABNORMAL HIGH (ref 70–99)

## 2021-06-16 LAB — HEMOGLOBIN A1C
Hgb A1c MFr Bld: 6.2 % — ABNORMAL HIGH (ref 4.8–5.6)
Mean Plasma Glucose: 131 mg/dL

## 2021-06-16 MED ORDER — SODIUM CHLORIDE 0.9% IV SOLUTION: Freq: Once | INTRAVENOUS | Status: AC

## 2021-06-16 MED ORDER — ASPIRIN EC 325 MG PO TBEC
325.0000 mg | DELAYED_RELEASE_TABLET | Freq: Every day | ORAL | Status: DC
  Administered 2021-06-17 – 2021-06-19 (×3): 325 mg via ORAL
  Filled 2021-06-16 (×3): qty 1

## 2021-06-16 MED ORDER — VITAMIN B-12 1000 MCG PO TABS
1000.0000 ug | ORAL_TABLET | Freq: Every day | ORAL | Status: DC
  Administered 2021-06-16 – 2021-06-19 (×4): 1000 ug via ORAL
  Filled 2021-06-16 (×4): qty 1

## 2021-06-16 MED ORDER — POLYETHYLENE GLYCOL 3350 17 G PO PACK
17.0000 g | PACK | Freq: Every day | ORAL | Status: DC
  Administered 2021-06-16 – 2021-06-18 (×2): 17 g via ORAL
  Filled 2021-06-16 (×4): qty 1

## 2021-06-16 MED ORDER — ASPIRIN 325 MG PO TABS: 325.0000 mg | ORAL_TABLET | Freq: Every day | ORAL | Status: DC

## 2021-06-16 NOTE — Progress Notes (Signed)
PROGRESS NOTE    Erin Baker  [id number] DOB: 04/08/1941 DOA: 06/14/2021 PCP: Kathreen Devoid, PA-C    Brief Narrative/Hospital Course:  Erin Baker, 80 y.o. female with PMH of  previous acute ischemic CVAs, hypertension, hypo-thyroidism, chronic iron deficiency anemia with baseline hemoglobin 8.5-10 g, chronic diastolic CHF presenting with left lower extremity weakness after she woke up at 5 AM 11/16 noticed new onset of left lower extremity weakness. She was brought to the ED work-up showed acute ischemic stroke as below also with anemia, neurology was consulted and patient was admitted for further management. Her MRI brain:Small focus of acute ischemia within the right corona radiata. No hemorrhage or mass effect. 2. Multiple old infarcts and findings of chronic ischemic microangiopathy. 3.  Cerebral Atrophy Seen by neurology completed a stroke work-up with LDL 51, CT angio head and neck with no LVO, patent right ICA stent multiple old infarcts and chronic ischemic microangiopathy and  stent treated thoracic aortic aneurysm  Subjective:  Resting comfortably no new complaints complains of feeling generalized weakness.  Assessment & Plan:  Acute ischemic stroke: MRI showing acute ischemia in the right corona radiata patient presented with left leg weakness.  Neurology on consult, LDL stable at 51, HbA1c 6.2, completed stroke work-up per neurology with CT angio head neck.  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 CT angio head and neck with no LVO, patent right ICA stent multiple old infarcts and chronic ischemic microangiopathy and  stent treated thoracic aortic aneurysm. Increasing aspirin from the 81 to 325 mg,no DAPT given anemia per neurology.  Continue Lipitor.  Will need 30-day event monitor  and neurology to arrange.  Hypertension: Blood pressure well controlled.Continue metoprolol.  Hypokalemia/Hypomagnesemia: Repleted  AKI: Creatinine improving monitor Recent Labs  Lab 06/14/21 1529 06/14/21 1545 06/15/21 0258 06/16/21 0144  BUN 23 24* 22 21  CREATININE 1.41* 1.50* 1.15* 1.29*  Normocytic anemia/chronic iron deficiency anemia: Anemia panel showed iron 26>56m normal folate.  She reports that she used to get regular blood transfusions last one 3 yrs ago,used to go to [facility].  She reports she has not been endoscopy colonoscopy for anemia and no cause was found.  She will benefit with  1 unit PRBC given symptomatic anemia with weakness and patient agrees after explaining risk and benefits.  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  Cont on rocephin x 5 days.  DVT prophylaxis: SCDs Start: 06/14/21 2139 Code Status:   Code Status: Full Code Family Communication: plan of care discussed with patient at bedside. Status is: Inpatient Remains hospitalized for ongoing management of acute CVA,Anemia UTI  disposition: Currently not medically stable for discharge. Anticipated Disposition: Skilled nursing facility, likely on Monday when it will be ready per TOC  Objective: Vitals last 24 hrs: Vitals:   06/15/21 2355 06/16/21 0348 06/16/21 0500 06/16/21 0748  BP: (!) 122/57 138/74  135/64  Pulse: 69 66  65  Resp: 17 17  18   Temp: 98.2 F (36.8 C) 98.1 F (36.7 C)  97.9 F (36.6 C)   TempSrc: Oral Oral  Oral  SpO2: 96% 97%  97%  Weight:   72.4 kg   Height:       Weight change: -1.083 kg  Intake/Output Summary (Last 24 hours) at 06/16/2021 0950 Last data filed at 06/16/2021 0300 Gross per 24 hour  Intake 100 ml  Output --  Net 100 ml   Net IO Since Admission: 460 mL [06/16/21 0950]   Physical Examination: General exam: AAOx 3, pleasant, not in distress HEENT:Oral mucosa moist, Ear/Nose WNL grossly, dentition normal. Respiratory system: bilaterally clear, no use of accessory muscle Cardiovascular system: S1 & S2 +, No JVD,. Gastrointestinal system: Abdomen soft,NT,ND, BS+ Nervous System:Alert, awake, moving extremities and grossly nonfocal Extremities: No edema, distal peripheral pulses palpable.  Skin: No rashes,no icterus. MSK: Normal muscle bulk,tone, power.  Medications reviewed:  Scheduled Meds:  sodium chloride   Intravenous Once   aspirin  325 mg Oral Daily   atorvastatin  40 mg Oral Daily   ferrous sulfate  325 mg Oral Q breakfast   insulin aspart  0-9 Units Subcutaneous TID WC   insulin detemir  15 Units Subcutaneous QHS   levothyroxine  100 mcg Oral Daily   magnesium oxide  400 mg Oral BID   metoprolol tartrate  25 mg Oral BID   pantoprazole  40 mg Oral BID   polyethylene glycol  17 g Oral Daily   sertraline  25 mg Oral Daily   umeclidinium bromide  1 puff Inhalation Daily   vitamin B-12  1,000 mcg Oral Daily   Continuous Infusions:  cefTRIAXone (ROCEPHIN)  IV 1 g (06/16/21 0843)   Diet Order             Diet Carb Modified Fluid consistency: Thin; Room service appropriate? Yes  Diet effective now                 Weight change: -1.083 kg  Wt Readings from Last 3 Encounters:  06/16/21 72.4 kg  04/12/21 74.9 kg     Consultants: neurology Procedures:see note Antimicrobials: Anti-infectives (From admission, onward)    Start     Dose/Rate Route Frequency Ordered Stop   06/15/21 1030  cefTRIAXone (ROCEPHIN) 1 g in sodium  chloride 0.9 % 100 mL IVPB        1 g 200 mL/hr over 30 Minutes Intravenous Every 24 hours 06/15/21 0940 06/18/21 1029      Culture/Microbiology    Component Value Date/Time   SDES BLOOD RIGHT HAND 04/06/2021 0104   SPECREQUEST  04/06/2021 0104    BOTTLES DRAWN AEROBIC AND ANAEROBIC Blood Culture adequate volume   CULT  04/06/2021 0104    NO GROWTH 5 DAYS Performed at [facility], [place] [address], [place], [place] [address]    REPTSTATUS 04/11/2021 FINAL 04/06/2021 0104    Other culture-see note  Unresulted Labs (From admission, onward)     Start     Ordered   06/17/21 [id number]  Basic metabolic panel  Daily,   R     Question:  Specimen collection method  Answer:  Lab=Lab collect   06/16/21 0745   06/17/21 0500  CBC  Daily,   R     Question:  Specimen collection method  Answer:  Lab=Lab collect   06/16/21 0745   06/15/21 0845  Occult blood card  to lab, stool  ONCE - STAT,   STAT        06/15/21 0844           Data Reviewed: I have personally reviewed following labs and imaging studies CBC: Recent Labs  Lab 06/14/21 1529 06/14/21 1545 06/15/21 0258 06/15/21 0916 06/15/21 1628 06/16/21 0144  WBC 10.0  --  8.5  --   --  7.5  NEUTROABS 7.4  --   --   --   --   --   HGB 7.8* 8.5* 7.0* 7.2* 7.4* 7.2*  HCT 27.2* 25.0* 24.3* 25.0* 24.9* 24.4*  MCV 85.3  --  83.5  --   --  83.3  PLT 230  --  215  --   --  227   Basic Metabolic Panel: Recent Labs  Lab 06/14/21 1529 06/14/21 1545 06/14/21 2156 06/15/21 0258 06/16/21 0144  NA 136 138  --  136 137  K 3.8 3.8  --  3.4* 3.9  CL 101 101  --  103 104  CO2 26  --   --  23 26  GLUCOSE 177* 173*  --  270* 136*  BUN 23 24*  --  22 21  CREATININE 1.41* 1.50*  --  1.15* 1.29*  CALCIUM 8.6*  --   --  8.3* 8.4*  MG  --   --  1.7 1.6*  --    GFR: Estimated Creatinine Clearance: 36.3 mL/min (A) (by C-G formula based on SCr of 1.29 mg/dL (H)). Liver Function Tests: Recent Labs  Lab 06/14/21 1529 06/15/21 0258  AST  13* 14*  ALT 12 9  ALKPHOS 90 78  BILITOT 0.9 0.7  PROT 5.9* 5.1*  ALBUMIN 2.7* 2.3*   No results for input(s): LIPASE, AMYLASE in the last 168 hours. No results for input(s): AMMONIA in the last 168 hours. Coagulation Profile: Recent Labs  Lab 06/14/21 1529  INR 1.3*   Cardiac Enzymes: No results for input(s): CKTOTAL, CKMB, CKMBINDEX, TROPONINI in the last 168 hours. BNP (last 3 results) No results for input(s): PROBNP in the last 8760 hours. HbA1C: Recent Labs    06/15/21 0258  HGBA1C 6.2*   CBG: Recent Labs  Lab 06/15/21 0904 06/15/21 1151 06/15/21 1617 06/15/21 2127 06/16/21 0607  GLUCAP 202* 130* 125* 195* 116*   Lipid Profile: Recent Labs    06/15/21 0258  CHOL 97  HDL 29*  LDLCALC 51  TRIG 83  CHOLHDL 3.3   Thyroid Function Tests: No results for input(s): TSH, T4TOTAL, FREET4, T3FREE, THYROIDAB in the last 72 hours. Anemia Panel: Recent Labs    06/14/21 2155 06/14/21 2156 06/15/21 0916  VITAMINB12 235  --  243  FOLATE  --  7.8 9.6  FERRITIN 47  --  32  TIBC 337  --  305  IRON 20*  --  43  RETICCTPCT  --  3.0 2.7   Sepsis Labs: No results for input(s): PROCALCITON, LATICACIDVEN in the last 168 hours.  Recent Results (from the past 240 hour(s))  Resp Panel by RT-PCR (Flu A&B, Covid) Nasopharyngeal Swab     Status: None   Collection Time: 06/14/21  9:35 PM   Specimen: Nasopharyngeal Swab; Nasopharyngeal(NP) swabs in vial transport medium  Result Value Ref Range Status   SARS Coronavirus 2 by RT PCR NEGATIVE NEGATIVE Final    Comment: (NOTE) SARS-CoV-2 target nucleic acids are NOT DETECTED.  The SARS-CoV-2 RNA is generally detectable in upper respiratory specimens during the acute phase of infection.  The lowest concentration of SARS-CoV-2 viral copies this assay can detect is 138 copies/mL. A negative result does not preclude SARS-Cov-2 infection and should not be used as the sole basis for treatment or other patient management  decisions. A negative result may occur with  improper specimen collection/handling, submission of specimen other than nasopharyngeal swab, presence of viral mutation(s) within the areas targeted by this assay, and inadequate number of viral copies(<138 copies/mL). A negative result must be combined with clinical observations, patient history, and epidemiological information. The expected result is Negative.  Fact Sheet for Patients:  BloggerCourse.com  Fact Sheet for Healthcare Providers:  SeriousBroker.it  This test is no t yet approved or cleared by the [place] FDA and  has been authorized for detection and/or diagnosis of SARS-CoV-2 by FDA under an Emergency Use Authorization (EUA). This EUA will remain  in effect (meaning this test can be used) for the duration of the COVID-19 declaration under Section 564(b)(1) of the Act, 21 U.S.C.section 360bbb-3(b)(1), unless the authorization is terminated  or revoked sooner.       Influenza A by PCR NEGATIVE NEGATIVE Final   Influenza B by PCR NEGATIVE NEGATIVE Final    Comment: (NOTE) The Xpert Xpress SARS-CoV-2/FLU/RSV plus assay is intended as an aid in the diagnosis of influenza from Nasopharyngeal swab specimens and should not be used as a sole basis for treatment. Nasal washings and aspirates are unacceptable for Xpert Xpress SARS-CoV-2/FLU/RSV testing.  Fact Sheet for Patients: BloggerCourse.com  Fact Sheet for Healthcare Providers: SeriousBroker.it  This test is not yet approved or cleared by the [place] FDA and has been authorized for detection and/or diagnosis of SARS-CoV-2 by FDA under an Emergency Use Authorization (EUA). This EUA will remain in effect (meaning this test can be used) for the duration of the COVID-19 declaration under Section 564(b)(1) of the Act, 21 U.S.C. section 360bbb-3(b)(1), unless the  authorization is terminated or revoked.  Performed at [facility], [address], [place], [place] [address]      Radiology Studies: CT ANGIO HEAD NECK W WO CM  Result Date: 06/15/2021 CLINICAL DATA:  Stroke follow-up EXAM: CT ANGIOGRAPHY HEAD AND NECK TECHNIQUE: Multidetector CT imaging of the head and neck was performed using the standard protocol during bolus administration of intravenous contrast. Multiplanar CT image reconstructions and MIPs were obtained to evaluate the vascular anatomy. Carotid stenosis measurements (when applicable) are obtained utilizing NASCET criteria, using the distal internal carotid diameter as the denominator. CONTRAST:  39mL OMNIPAQUE IOHEXOL 350 MG/ML SOLN COMPARISON:  None. FINDINGS: CT HEAD FINDINGS Brain: There is no mass, hemorrhage or extra-axial collection. There is generalized brain atrophy greater than expected at this age. There are multiple old infarcts, greatest in the posterior right MCA territory. There is hypoattenuation of the periventricular white matter, most commonly indicating chronic ischemic microangiopathy. Skull: The visualized skull base, calvarium and extracranial soft tissues are normal. Sinuses/Orbits: No fluid levels or advanced mucosal thickening of the visualized paranasal sinuses. No mastoid or middle ear effusion. The orbits are normal. CTA NECK FINDINGS SKELETON: There is no bony spinal canal stenosis. No lytic or blastic lesion. OTHER NECK: Normal pharynx, larynx and major salivary glands. No cervical lymphadenopathy. Unremarkable thyroid gland. UPPER CHEST: Dependent atelectasis in the left upper lobe. AORTIC ARCH: There is calcific atherosclerosis of the aortic arch. There is a stent within the aortic arch. Conventional 3 vessel aortic branching pattern. The left subclavian artery origin remains occluded. There is a patent left common  carotid to subclavian graft. RIGHT CAROTID SYSTEM:  Right ICA stent is patent. LEFT CAROTID  SYSTEM: No dissection, occlusion or aneurysm. There is mixed density atherosclerosis extending into the proximal ICA, resulting in less than 50% stenosis. VERTEBRAL ARTERIES: Left dominant configuration. Both origins are clearly patent. There is no dissection, occlusion or flow-limiting stenosis to the skull base (V1-V3 segments). CTA HEAD FINDINGS POSTERIOR CIRCULATION: --Vertebral arteries: Normal V4 segments. --Inferior cerebellar arteries: Normal. --Basilar artery: Normal. --Superior cerebellar arteries: Normal. --Posterior cerebral arteries (PCA): Normal. ANTERIOR CIRCULATION: --Intracranial internal carotid arteries: Normal. --Anterior cerebral arteries (ACA): Normal. Both A1 segments are present. Patent anterior communicating artery (a-comm). --Middle cerebral arteries (MCA): Normal. VENOUS SINUSES: As permitted by contrast timing, patent. ANATOMIC VARIANTS: None Review of the MIP images confirms the above findings. IMPRESSION: 1. No emergent large vessel occlusion or high-grade stenosis of the intracranial arteries. 2. Patent right ICA stent. 3. Multiple old infarcts and findings of chronic ischemic microangiopathy. 4. Stent-treated thoracic aortic aneurysm. Aortic Atherosclerosis (ICD10-I70.0). Cerebral Atrophy (ICD10-G31.9). Electronically Signed   By: Ulyses Jarred M.D.   On: 06/15/2021 20:13   CT HEAD WO CONTRAST  Result Date: 06/14/2021 CLINICAL DATA:  Neuro deficit, acute, stroke suspected new onset of left leg weakness over night. EXAM: CT HEAD WITHOUT CONTRAST TECHNIQUE: Contiguous axial images were obtained from the base of the skull through the vertex without intravenous contrast. COMPARISON:  MRI 04/05/2021 FINDINGS: Brain: There are multiple old infarctions scattered throughout the brain. No abnormality seen affecting the brainstem. Multiple old bilateral cerebellar infarctions. Cerebral hemispheres show widespread chronic small-vessel ischemic changes of the white matter and numerous old  cortical and subcortical infarctions, more numerous in the right hemisphere than the left. None of these are identifiably acute when compared to the studies of earlier this year. No mass, hemorrhage, hydrocephalus or extra-axial collection. Vascular: There is atherosclerotic calcification of the major vessels at the base of the brain. Skull: Negative Sinuses/Orbits: Clear/normal Other: None IMPRESSION: No acute finding by CT. Extensive old ischemic changes throughout the cerebellum and cerebral hemispheres as outlined above, without identifiable acute infarction or hemorrhage. Certainly, a small acute insult could be hidden within the extensive chronic changes. Electronically Signed   By: Nelson Chimes M.D.   On: 06/14/2021 16:16   MR BRAIN WO CONTRAST  Result Date: 06/14/2021 CLINICAL DATA:  Acute neurologic deficit EXAM: MRI HEAD WITHOUT CONTRAST TECHNIQUE: Multiplanar, multiecho pulse sequences of the brain and surrounding structures were obtained without intravenous contrast. COMPARISON:  None. FINDINGS: Brain: Small focus of acute ischemia within the right corona radiata. No acute or chronic hemorrhage. Multiple old infarcts of the cerebellum, right occipital lobe, right frontal lobe and the corona radiata/centrum semiovale. Diffuse generalized atrophy. Confluent white matter hyperintensity. The midline structures are normal. Vascular: Major flow voids are preserved. Skull and upper cervical spine: Normal calvarium and skull base. Visualized upper cervical spine and soft tissues are normal. Sinuses/Orbits:No paranasal sinus fluid levels or advanced mucosal thickening. No mastoid or middle ear effusion. Normal orbits. IMPRESSION: 1. Small focus of acute ischemia within the right corona radiata. No hemorrhage or mass effect. 2. Multiple old infarcts and findings of chronic ischemic microangiopathy. 3.  Cerebral Atrophy (ICD10-G31.9). Electronically Signed   By: Ulyses Jarred M.D.   On: 06/14/2021 20:22      LOS: 1 day   Antonieta Pert, MD [facility]  06/16/2021, 9:50 AM

## 2021-06-16 NOTE — Progress Notes (Addendum)
STROKE TEAM PROGRESS NOTE   ATTENDING NOTE: I reviewed above note and agree with the assessment and plan. Pt was seen and examined.   To family members at bedside.  Patient seen in chair, no acute event overnight, no complaints.  Neuro stable.  CTA head and neck showed right ICA stent patent.  A1c 6.2.  Hemoglobin 7.2 today, Dr. Jonathon Bellows will give 1 unit PRBC transfusion.  Given stable hemoglobin, will recommend increase from aspirin 81 to 325 for further stroke prevention.  Avoid DAPT for now due to severe anemia needing blood transfusion.  Continue Lipitor 40.  Given current stroke in the juxtacortical area, will recommend 30-day cardiac event monitoring as outpatient to rule out A. fib although she had 3+ years loop recorder showed no A. fib in the past.  PT/OT recommend SNF.  For detailed assessment and plan, please refer to above as I have made changes wherever appropriate.   Neurology will sign off. Please call with questions. Pt will follow up with stroke clinic NP at [facility] in about 4 weeks. Thanks for the consult.   Marvel Plan, MD PhD Stroke Neurology 06/16/2021 6:03 PM    INTERVAL HISTORY No one is at the bedside at time of this exam. Pt receiving one unit prbc for hemoglobin 7.2 today. She looks good.    Vitals:   06/16/21 0500 06/16/21 0748 06/16/21 1007 06/16/21 1022  BP:  135/64 (!) 106/44 (!) 107/53  Pulse:  65 (!) 52 (!) 49  Resp:  18 18 17   Temp:  97.9 F (36.6 C) 98.1 F (36.7 C) 98.1 F (36.7 C)  TempSrc:  Oral Oral Oral  SpO2:  97% 100% 100%  Weight: 72.4 kg     Height:       CBC:  Recent Labs  Lab 06/14/21 1529 06/14/21 1545 06/15/21 0258 06/15/21 0916 06/15/21 1628 06/16/21 0144  WBC 10.0  --  8.5  --   --  7.5  NEUTROABS 7.4  --   --   --   --   --   HGB 7.8*   < > 7.0*   < > 7.4* 7.2*  HCT 27.2*   < > 24.3*   < > 24.9* 24.4*  MCV 85.3  --  83.5  --   --  83.3  PLT 230  --  215  --   --  227   < > = values in this interval not displayed.    Basic  Metabolic Panel:  Recent Labs  Lab 06/14/21 2156 06/15/21 0258 06/16/21 0144  NA  --  136 137  K  --  3.4* 3.9  CL  --  103 104  CO2  --  23 26  GLUCOSE  --  270* 136*  BUN  --  22 21  CREATININE  --  1.15* 1.29*  CALCIUM  --  8.3* 8.4*  MG 1.7 1.6*  --      Lipid Panel:  Recent Labs  Lab 06/15/21 0258  CHOL 97  TRIG 83  HDL 29*  CHOLHDL 3.3  VLDL 17  LDLCALC 51     HgbA1c:  Recent Labs  Lab 06/15/21 0258  HGBA1C 6.2*   Urine Drug Screen: No results for input(s): LABOPIA, COCAINSCRNUR, LABBENZ, AMPHETMU, THCU, LABBARB in the last 168 hours.  Alcohol Level No results for input(s): ETH in the last 168 hours.  IMAGING past 24 hours CT ANGIO HEAD NECK W WO CM  Result Date: 06/15/2021 CLINICAL DATA:  Stroke  follow-up EXAM: CT ANGIOGRAPHY HEAD AND NECK TECHNIQUE: Multidetector CT imaging of the head and neck was performed using the standard protocol during bolus administration of intravenous contrast. Multiplanar CT image reconstructions and MIPs were obtained to evaluate the vascular anatomy. Carotid stenosis measurements (when applicable) are obtained utilizing NASCET criteria, using the distal internal carotid diameter as the denominator. CONTRAST:  62mL OMNIPAQUE IOHEXOL 350 MG/ML SOLN COMPARISON:  None. FINDINGS: CT HEAD FINDINGS Brain: There is no mass, hemorrhage or extra-axial collection. There is generalized brain atrophy greater than expected at this age. There are multiple old infarcts, greatest in the posterior right MCA territory. There is hypoattenuation of the periventricular white matter, most commonly indicating chronic ischemic microangiopathy. Skull: The visualized skull base, calvarium and extracranial soft tissues are normal. Sinuses/Orbits: No fluid levels or advanced mucosal thickening of the visualized paranasal sinuses. No mastoid or middle ear effusion. The orbits are normal. CTA NECK FINDINGS SKELETON: There is no bony spinal canal stenosis. No lytic or  blastic lesion. OTHER NECK: Normal pharynx, larynx and major salivary glands. No cervical lymphadenopathy. Unremarkable thyroid gland. UPPER CHEST: Dependent atelectasis in the left upper lobe. AORTIC ARCH: There is calcific atherosclerosis of the aortic arch. There is a stent within the aortic arch. Conventional 3 vessel aortic branching pattern. The left subclavian artery origin remains occluded. There is a patent left common carotid to subclavian graft. RIGHT CAROTID SYSTEM:  Right ICA stent is patent. LEFT CAROTID SYSTEM: No dissection, occlusion or aneurysm. There is mixed density atherosclerosis extending into the proximal ICA, resulting in less than 50% stenosis. VERTEBRAL ARTERIES: Left dominant configuration. Both origins are clearly patent. There is no dissection, occlusion or flow-limiting stenosis to the skull base (V1-V3 segments). CTA HEAD FINDINGS POSTERIOR CIRCULATION: --Vertebral arteries: Normal V4 segments. --Inferior cerebellar arteries: Normal. --Basilar artery: Normal. --Superior cerebellar arteries: Normal. --Posterior cerebral arteries (PCA): Normal. ANTERIOR CIRCULATION: --Intracranial internal carotid arteries: Normal. --Anterior cerebral arteries (ACA): Normal. Both A1 segments are present. Patent anterior communicating artery (a-comm). --Middle cerebral arteries (MCA): Normal. VENOUS SINUSES: As permitted by contrast timing, patent. ANATOMIC VARIANTS: None Review of the MIP images confirms the above findings. IMPRESSION: 1. No emergent large vessel occlusion or high-grade stenosis of the intracranial arteries. 2. Patent right ICA stent. 3. Multiple old infarcts and findings of chronic ischemic microangiopathy. 4. Stent-treated thoracic aortic aneurysm. Aortic Atherosclerosis (ICD10-I70.0). Cerebral Atrophy (ICD10-G31.9). Electronically Signed   By: Ulyses Jarred M.D.   On: 06/15/2021 20:13    PHYSICAL EXAM  Mental Status: Awake and alert. Oriented  Speech fluent with intact naming and  comprehension. Occasional subtle dysarthria is noted. Good insight. Pleasant and cooperative.  Cranial Nerves: II: Temporal visual fields intact with no extinction to DSS. PERRL.   III,IV, VI: No ptosis. EOMI. No nystagmus.  V,VII: Smile symmetric, facial temp sensation equal bilaterally VIII: hearing intact to voice IX,X: No hypophonia XI: Symmetric XII: Midline tongue extension Motor: BUE 4+/5 proximally and distally. No pronator drift.  RLE 4+/5 proximally and distally LLE 2/5 HF, 3/5 KE and KF, 4-/5 ADF/APF Sensory: FT intact x 4. Temp sensation equal to BLE. Temp sensation decreased to LUE. No extinction to DSS.  Deep Tendon Reflexes: Mildly hyperactive left sided reflexes, including low amplitude patellar with crossed adductor response. Toes downgoing bilaterally  Cerebellar: No ataxia with FNF bilaterally  Gait: Deferred  ASSESSMENT/PLAN Erin Baker is a 80 y.o. female with history of aortic aneurysm, DM, HTN, multiple prior strokes, right transcarotid artery revascularization in September of this  year and CAD who presents to the hospital with new onset of LLE weakness. LKN was at midnight. She woke up at about 5 AM with limited mobility and weakness of her LLE which was noticed when as she walked to the bathroom at her rehab center.  She felt as though her leg was dragging.. She has chronic stroke-related deficits of left hand numbness and slurred speech. She is ambulatory at baseline. Home medications include ASA and atorvastatin. Was also on Plavix until about 2 months ago.    MRI brain revealed a small acute infarction within the right corona radiata.  Additionally patient has developed iron deficiency anemia and hemoglobin today is 7.0, down from 9.6 On 05/22/21.   Stroke:  right MCA/ACA small infarct embolic pattern, source unclear, small vs. Large vessel disease Code Stroke   CT head No acute abnormality.  Small vessel disease. Atrophy.  ASPECTS 10.     CTA head & neck -  Patent right ICA stent. Multiple old infarcts and findings of chronic ischemic microangiopathy. Stent-treated thoracic aortic aneurysm. MRI  Brain: Small focus of acute ischemia within the right corona radiata. Multiple old infarcts and findings of chronic ischemic microangiopathy 2D Echo EF 60%, aortic dilatation, normal LA size, no IA shunt Recommend 30 day cardiac event monitoring to rule out afib LDL 51 HgbA1c 6.2 VTE prophylaxis - scd  aspirin 81 mg daily prior to admission, now on aspirin 325 mg daily. No DAPT given severe anemia needing PRBC. Therapy recommendations:  SNF Disposition:  pending   Hypertension Home meds:  metoprolol Stable Permissive hypertension (OK if < 220/120) but gradually normalize in 5-7 days Long-term BP goal normotensive  Hyperlipidemia Home meds:  lipitor 40mg ,  resumed in hospital LDL 51, goal < 70 Continue statin at discharge  Diabetes type II Uncontrolled Home meds:  glipizide HgbA1c 7.7, goal < 7.0 CBGs SSI  Other Stroke Risk Factors  Advanced Age >/= 58  Hx stroke/TIA CHRONIC DIASTOLOIC HEART FAILURE   Other Active Problems Asymptomatic pyuria Hypothyroidism Chronic iron deficiency anemia  gerd  Hospital day # 1     To contact Stroke Continuity provider, please refer to [email or web address] After hours, contact General Neurology

## 2021-06-16 NOTE — Progress Notes (Signed)
Physical Therapy Treatment Patient Details Name: Erin Baker MRN: [id number] DOB: 09-25-1940 Today's Date: 06/16/2021   History of Present Illness Pt is a 80 y/o F Admitted on 11/16 due to L sided weakness. 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    PT Comments    Pt progressing towards physical therapy goals. Was able to progress ambulation distance and appeared to recover in less time compared to yesterday's session. Chair follow was helpful during gait training. Pt continues to demonstrate decreased strength and muscular endurance in the LLE however was able to make corrective changes in short bouts and take a larger step length on the L and increase heel strike.  Will continue to follow and progress as able per POC.   Recommendations for follow up therapy are one component of a multi-disciplinary discharge planning process, led by the attending physician.  Recommendations may be updated based on patient status, additional functional criteria and insurance authorization.  Follow Up Recommendations  Skilled nursing-short term rehab (<3 hours/day)     Assistance Recommended at Discharge Frequent or constant Supervision/Assistance  Equipment Recommendations  None recommended by PT (TBD by next venue of care)    Recommendations for Other Services       Precautions / Restrictions Precautions Precautions: Fall Precaution Comments: L side weakness Restrictions Weight Bearing Restrictions: No     Mobility  Bed Mobility Overal bed mobility: Needs Assistance Bed Mobility: Supine to Sit     Supine to sit: Supervision     General bed mobility comments: Pt was able to transition to EOB with supervision. Increased time but pt anxious to get up and move. Several cues to wait until therapist was  ready for her.    Transfers Overall transfer level: Needs assistance Equipment used: Rolling Masri (2 wheels) Transfers: Sit to/from Stand Sit to Stand: Min guard           General transfer comment: Close guard for safety as pt powered up to full stand. VC's for hand placement on seated surface for safety.    Ambulation/Gait Ambulation/Gait assistance: Min assist Gait Distance (Feet): 45 Feet (25'+ 20' + 45') Assistive device: Rolling Wester (2 wheels) Gait Pattern/deviations: Shuffle;Decreased dorsiflexion - left;Decreased stance time - left;Decreased weight shift to left Gait velocity: Decreased Gait velocity interpretation: <1.31 ft/sec, indicative of household ambulator   General Gait Details: x2 seated rest breaks due to fatigue. Min assist grossly for balance, Benally management. Chair follow for safety.   Stairs             Wheelchair Mobility    Modified Rankin (Stroke Patients Only) Modified Rankin (Stroke Patients Only) Pre-Morbid Rankin Score: Moderately severe disability Modified Rankin: Moderately severe disability     Balance Overall balance assessment: Needs assistance Sitting-balance support: No upper extremity supported;Feet supported Sitting balance-[name] Scale: Good     Standing balance support: Reliant on assistive device for balance Standing balance-[name] Scale: Poor Standing balance comment: Pt with heavy reliance on UE supported to maintain balance                            Cognition Arousal/Alertness: Awake/alert Behavior During Therapy: WFL for tasks assessed/performed Overall Cognitive Status: Impaired/Different from baseline Area of Impairment: Safety/judgement  Safety/Judgement: Decreased awareness of safety;Decreased awareness of deficits     General Comments: Pt has had multiple strokes, will benefit from further functional assessing of her cognition.        Exercises       General Comments        Pertinent Vitals/Pain Pain Assessment: No/denies pain    Home Living                          Prior Function            PT Goals (current goals can now be found in the care plan section) Acute Rehab PT Goals Patient Stated Goal: Return to SNF but a different location PT Goal Formulation: With patient Time For Goal Achievement: 06/29/21 Potential to Achieve Goals: Good Progress towards PT goals: Progressing toward goals    Frequency    Min 3X/week      PT Plan Current plan remains appropriate    Co-evaluation              AM-PAC PT "6 Clicks" Mobility   Outcome Measure  Help needed turning from your back to your side while in a flat bed without using bedrails?: None Help needed moving from lying on your back to sitting on the side of a flat bed without using bedrails?: A Little Help needed moving to and from a bed to a chair (including a wheelchair)?: A Little Help needed standing up from a chair using your arms (e.g., wheelchair or bedside chair)?: A Little Help needed to walk in hospital room?: A Little Help needed climbing 3-5 steps with a railing? : A Lot 6 Click Score: 18    End of Session Equipment Utilized During Treatment: Gait belt Activity Tolerance: Patient tolerated treatment well Patient left: in chair;with call bell/phone within reach;with chair alarm set Nurse Communication: Mobility status PT Visit Diagnosis: Unsteadiness on feet (R26.81);Other symptoms and signs involving the nervous system (R29.898)     Time: 1351-1416 PT Time Calculation (min) (ACUTE ONLY): 25 min  Charges:  $Gait Training: 23-37 mins                     Conni Slipper, PT, DPT Acute Rehabilitation Services Pager: [phone] Office: [phone]    Marylynn Pearson 06/16/2021, 3:01 PM

## 2021-06-17 DIAGNOSIS — I639 Cerebral infarction, unspecified: Secondary | ICD-10-CM | POA: Diagnosis not present

## 2021-06-17 LAB — GLUCOSE, CAPILLARY
Glucose-Capillary: 138 mg/dL — ABNORMAL HIGH (ref 70–99)
Glucose-Capillary: 189 mg/dL — ABNORMAL HIGH (ref 70–99)
Glucose-Capillary: 133 mg/dL — ABNORMAL HIGH (ref 70–99)
Glucose-Capillary: 145 mg/dL — ABNORMAL HIGH (ref 70–99)
Glucose-Capillary: 153 mg/dL — ABNORMAL HIGH (ref 70–99)

## 2021-06-17 LAB — TYPE AND SCREEN
ABO/RH(D): O POS
Antibody Screen: NEGATIVE
Unit division: 0

## 2021-06-17 LAB — BPAM RBC
Blood Product Expiration Date: 202211222359
ISSUE DATE / TIME: 202211181002
Unit Type and Rh: 5100

## 2021-06-17 LAB — CBC
HCT: 29.1 % — ABNORMAL LOW (ref 36.0–46.0)
Hemoglobin: 9 g/dL — ABNORMAL LOW (ref 12.0–15.0)
MCH: 25.9 pg — ABNORMAL LOW (ref 26.0–34.0)
MCHC: 30.9 g/dL (ref 30.0–36.0)
MCV: 83.6 fL (ref 80.0–100.0)
Platelets: 204 10*3/uL (ref 150–400)
RBC: 3.48 MIL/uL — ABNORMAL LOW (ref 3.87–5.11)
RDW: 17.1 % — ABNORMAL HIGH (ref 11.5–15.5)
WBC: 8.9 10*3/uL (ref 4.0–10.5)
nRBC: 0 % (ref 0.0–0.2)

## 2021-06-17 LAB — BASIC METABOLIC PANEL
Anion gap: 6 (ref 5–15)
BUN: 23 mg/dL (ref 8–23)
CO2: 25 mmol/L (ref 22–32)
Calcium: 8.7 mg/dL — ABNORMAL LOW (ref 8.9–10.3)
Chloride: 105 mmol/L (ref 98–111)
Creatinine, Ser: 1.19 mg/dL — ABNORMAL HIGH (ref 0.44–1.00)
GFR, Estimated: 47 mL/min — ABNORMAL LOW (ref >60)
Glucose, Bld: 163 mg/dL — ABNORMAL HIGH (ref 70–99)
Potassium: 4.3 mmol/L (ref 3.5–5.1)
Sodium: 136 mmol/L (ref 135–145)

## 2021-06-17 NOTE — Progress Notes (Signed)
PROGRESS NOTE    Erin Baker  PIR:[id number] DOB: [birth year] DOA: 06/14/2021 PCP: Clemencia Course, PA-C    Brief Narrative/Hospital Course:  Erin Baker, 80 y.o. female with PMH of  previous acute ischemic CVAs, hypertension, hypo-thyroidism, chronic iron deficiency anemia with baseline hemoglobin 8.5-10 g, chronic diastolic CHF presenting with left lower extremity weakness after she woke up at 5 AM 11/16 noticed new onset of left lower extremity weakness. She was brought to the ED work-up showed acute ischemic stroke as below also with anemia, neurology was consulted and patient was admitted for further management. Her MRI brain:Small focus of acute ischemia within the right corona radiata. No hemorrhage or mass effect. 2. Multiple old infarcts and findings of chronic ischemic microangiopathy. 3.  Cerebral Atrophy Seen by neurology completed a stroke work-up with LDL 51, CT angio head and neck with no LVO, patent right ICA stent multiple old infarcts and chronic ischemic microangiopathy and  stent treated thoracic aortic aneurysm  Subjective: Seen and examined this morning.  She is resting comfortably and has no new complaints. No new complaints LLE weakness still present  Assessment & Plan:  Acute ischemic stroke: MRI showing acute ischemia in the right corona radiata patient presented with left leg weakness.  Neurology on consult, LDL stable at 51, HbA1c 6.2, completed stroke work-up per neurology with CT angio head neck.  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 CT angio head and neck with no LVO, patent right ICA stent multiple old infarcts and chronic ischemic microangiopathy and  stent treated thoracic aortic aneurysm. Increasing aspirin from the 81 to 325 mg,no DAPT given anemia per neurology.   Continue Lipitor.  Will need 30-day event monitor and neurology to arrange.  Hypertension: Blood pressure well controlled.Continue metoprolol.  Hypokalemia/Hypomagnesemia: Repleted  AKI: Renal function improving.  Encourage hydration Recent Labs  Lab 06/14/21 1529 06/14/21 1545 06/15/21 0258 06/16/21 0144 06/17/21 0200  BUN 23 24* 22 21 23   CREATININE 1.41* 1.50* 1.15* 1.29* 1.19*   Normocytic anemia/chronic iron deficiency anemia: Anemia panel showed iron 26>89m normal folate.  She reports that she used to get regular blood transfusions last one 3 yrs ago,used to go to [facility].  She reports she  had endoscopy colonoscopy recently over 3 years ago for anemia and no causes were found.  S/P 1 unit PRBC and hemoglobin improved.  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  Complete rocephin x 5 days.  DVT prophylaxis: SCDs Start: 06/14/21 2139 Code Status:   Code Status: Full Code Family Communication: plan of care discussed with patient at bedside. Status is: Inpatient Remains hospitalized for ongoing management of acute CVA,Anemia UTI  disposition: Currently medically stable for discharge. Anticipated Disposition: Skilled nursing facility, likely on Monday when it will be ready as per TOC  Objective: Vitals last 24 hrs: Vitals:   06/16/21 2044 06/17/21 0028 06/17/21 0437 06/17/21 06/19/21  BP: (!) 150/65 (!) 147/65 (!) 155/75   Pulse: 69 68 67   Resp: 18 16 16    Temp: 98.1 F (36.7 C) 97.6 F (36.4 C) 97.7 F (36.5 C)   TempSrc:  Oral Oral Oral   SpO2: 98% 98% 97% 97%  Weight:      Height:       Weight change:   Intake/Output Summary (Last 24 hours) at 06/17/2021 [id number] Last data filed at 06/16/2021 1242 Gross per 24 hour  Intake 390 ml  Output --  Net 390 ml   Net IO Since Admission: 850 mL [06/17/21 0808]   Physical Examination: General exam: AAOx 3, pleasant, NAD, HEENT:Oral mucosa moist, Ear/Nose WNL grossly, dentition normal. Respiratory system: bilaterally clear, no use of accessory muscle Cardiovascular system: S1 & S2 +, No JVD,. Gastrointestinal system: Abdomen soft, NT,ND, BS+ Nervous System:Alert, awake, moving extremities and weak LLE Extremities: no edema, distal peripheral pulses palpable.  Skin: No rashes,no icterus. MSK: Normal muscle bulk,tone, power   Medications reviewed:  Scheduled Meds:  aspirin EC  325 mg Oral Daily   atorvastatin  40 mg Oral Daily   ferrous sulfate  325 mg Oral Q breakfast   insulin aspart  0-9 Units Subcutaneous TID WC   insulin detemir  15 Units Subcutaneous QHS   levothyroxine  100 mcg Oral Daily   magnesium oxide  400 mg Oral BID   metoprolol tartrate  25 mg Oral BID   pantoprazole  40 mg Oral BID   polyethylene glycol  17 g Oral Daily   sertraline  25 mg Oral Daily   umeclidinium bromide  1 puff Inhalation Daily   vitamin B-12  1,000 mcg Oral Daily   Continuous Infusions:  cefTRIAXone (ROCEPHIN)  IV Stopped (06/16/21 0913)   Diet Order             Diet Carb Modified Fluid consistency: Thin; Room service appropriate? Yes  Diet effective now                 Weight change:   Wt Readings from Last 3 Encounters:  06/16/21 72.4 kg  04/12/21 74.9 kg     Consultants: neurology Procedures:see note Antimicrobials: Anti-infectives (From admission, onward)    Start     Dose/Rate Route Frequency Ordered Stop   06/15/21 1030  cefTRIAXone (ROCEPHIN) 1 g in sodium chloride 0.9 % 100 mL IVPB        1 g 200 mL/hr over 30 Minutes Intravenous Every  24 hours 06/15/21 0940 06/18/21 1029      Culture/Microbiology    Component Value Date/Time   SDES BLOOD RIGHT HAND 04/06/2021 0104   SPECREQUEST  04/06/2021 0104    BOTTLES DRAWN AEROBIC AND ANAEROBIC Blood Culture adequate volume   CULT  04/06/2021 0104    NO GROWTH 5 DAYS Performed at [facility], [place] [address] [place],  [address]    REPTSTATUS 04/11/2021 FINAL 04/06/2021 0104    Other culture-see note  Unresulted Labs (From admission, onward)     Start     Ordered   06/17/21 [id number]  Basic metabolic panel  Daily,   R     Question:  Specimen collection method  Answer:  Lab=Lab collect   06/16/21 0745   06/17/21 0500  CBC  Daily,   R     Question:  Specimen collection method  Answer:  Lab=Lab collect   06/16/21 0745   06/15/21 0845  Occult blood card to lab, stool  ONCE - STAT,   STAT        06/15/21 0844           Data Reviewed: I have personally reviewed following labs and imaging studies CBC: Recent Labs  Lab 06/14/21 1529 06/14/21 1545 06/15/21 0258 06/15/21 0916 06/15/21 1628 06/16/21 0144 06/16/21 1451 06/17/21 0200  WBC 10.0  --  8.5  --   --  7.5  --  8.9  NEUTROABS 7.4  --   --   --   --   --   --   --   HGB 7.8*   < > 7.0* 7.2* 7.4* 7.2* 9.3* 9.0*  HCT 27.2*   < > 24.3* 25.0* 24.9* 24.4* 30.2* 29.1*  MCV 85.3  --  83.5  --   --  83.3  --  83.6  PLT 230  --  215  --   --  227  --  204   < > = values in this interval not displayed.   Basic Metabolic Panel: Recent Labs  Lab 06/14/21 1529 06/14/21 1545 06/14/21 2156 06/15/21 0258 06/16/21 0144 06/17/21 0200  NA 136 138  --  136 137 136  K 3.8 3.8  --  3.4* 3.9 4.3  CL 101 101  --  103 104 105  CO2 26  --   --  23 26 25   GLUCOSE 177* 173*  --  270* 136* 163*  BUN 23 24*  --  22 21 23   CREATININE 1.41* 1.50*  --  1.15* 1.29* 1.19*  CALCIUM 8.6*  --   --  8.3* 8.4* 8.7*  MG  --   --  1.7 1.6*  --   --    GFR: Estimated Creatinine Clearance: 39.3 mL/min (A) (by C-G formula  based on SCr of 1.19 mg/dL (H)). Liver Function Tests: Recent Labs  Lab 06/14/21 1529 06/15/21 0258  AST 13* 14*  ALT 12 9  ALKPHOS 90 78  BILITOT 0.9 0.7  PROT 5.9* 5.1*  ALBUMIN 2.7* 2.3*   No results for input(s): LIPASE, AMYLASE in the last 168 hours. No results for input(s): AMMONIA in the last 168 hours. Coagulation Profile: Recent Labs  Lab 06/14/21 1529  INR 1.3*   Cardiac Enzymes: No results for input(s): CKTOTAL, CKMB, CKMBINDEX, TROPONINI in the last 168 hours. BNP (last 3 results) No results for input(s): PROBNP in the last 8760 hours. HbA1C: Recent Labs    06/15/21 0258  HGBA1C 6.2*   CBG: Recent Labs  Lab 06/15/21 2127 06/16/21 0607 06/16/21 1209 06/16/21 1729 06/17/21 0644  GLUCAP 195* 116* 164* 160* 138*   Lipid Profile: Recent Labs    06/15/21 0258  CHOL 97  HDL 29*  LDLCALC 51  TRIG 83  CHOLHDL 3.3   Thyroid Function Tests: No results for input(s): TSH, T4TOTAL, FREET4, T3FREE, THYROIDAB in the last 72 hours. Anemia Panel: Recent Labs    06/14/21 2155 06/14/21 2156 06/15/21 0916  VITAMINB12 235  --  243  FOLATE  --  7.8 9.6  FERRITIN 47  --  32  TIBC 337  --  305  IRON 20*  --  43  RETICCTPCT  --  3.0 2.7   Sepsis Labs: No results for input(s): PROCALCITON, LATICACIDVEN in the last 168 hours.  Recent Results (from the past 240 hour(s))  Resp Panel by RT-PCR (Flu A&B, Covid) Nasopharyngeal Swab     Status: None   Collection Time: 06/14/21  9:35 PM   Specimen: Nasopharyngeal Swab;  Nasopharyngeal(NP) swabs in vial transport medium  Result Value Ref Range Status   SARS Coronavirus 2 by RT PCR NEGATIVE NEGATIVE Final    Comment: (NOTE) SARS-CoV-2 target nucleic acids are NOT DETECTED.  The SARS-CoV-2 RNA is generally detectable in upper respiratory specimens during the acute phase of infection. The lowest concentration of SARS-CoV-2 viral copies this assay can detect is 138 copies/mL. A negative result does not preclude  SARS-Cov-2 infection and should not be used as the sole basis for treatment or other patient management decisions. A negative result may occur with  improper specimen collection/handling, submission of specimen other than nasopharyngeal swab, presence of viral mutation(s) within the areas targeted by this assay, and inadequate number of viral copies(<138 copies/mL). A negative result must be combined with clinical observations, patient history, and epidemiological information. The expected result is Negative.  Fact Sheet for Patients:  EntrepreneurPulse.com.au  Fact Sheet for Healthcare Providers:  IncredibleEmployment.be  This test is no t yet approved or cleared by the [place] FDA and  has been authorized for detection and/or diagnosis of SARS-CoV-2 by FDA under an Emergency Use Authorization (EUA). This EUA will remain  in effect (meaning this test can be used) for the duration of the COVID-19 declaration under Section 564(b)(1) of the Act, 21 U.S.C.section 360bbb-3(b)(1), unless the authorization is terminated  or revoked sooner.       Influenza A by PCR NEGATIVE NEGATIVE Final   Influenza B by PCR NEGATIVE NEGATIVE Final    Comment: (NOTE) The Xpert Xpress SARS-CoV-2/FLU/RSV plus assay is intended as an aid in the diagnosis of influenza from Nasopharyngeal swab specimens and should not be used as a sole basis for treatment. Nasal washings and aspirates are unacceptable for Xpert Xpress SARS-CoV-2/FLU/RSV testing.  Fact Sheet for Patients: EntrepreneurPulse.com.au  Fact Sheet for Healthcare Providers: IncredibleEmployment.be  This test is not yet approved or cleared by the [place] FDA and has been authorized for detection and/or diagnosis of SARS-CoV-2 by FDA under an Emergency Use Authorization (EUA). This EUA will remain in effect (meaning this test can be used) for the duration of  the COVID-19 declaration under Section 564(b)(1) of the Act, 21 U.S.C. section 360bbb-3(b)(1), unless the authorization is terminated or revoked.  Performed at [facility], [place] [address], [place], [place] [address]      Radiology Studies: CT ANGIO HEAD NECK W WO CM  Result Date: 06/15/2021 CLINICAL DATA:  Stroke follow-up EXAM: CT ANGIOGRAPHY HEAD AND NECK TECHNIQUE: Multidetector CT imaging of the head and neck was performed using the standard protocol during bolus administration of intravenous contrast. Multiplanar CT image reconstructions and MIPs were obtained to evaluate the vascular anatomy. Carotid stenosis measurements (when applicable) are obtained utilizing NASCET criteria, using the distal internal carotid diameter as the denominator. CONTRAST:  62mL OMNIPAQUE IOHEXOL 350 MG/ML SOLN COMPARISON:  None. FINDINGS: CT HEAD FINDINGS Brain: There is no mass, hemorrhage or extra-axial collection. There is generalized brain atrophy greater than expected at this age. There are multiple old infarcts, greatest in the posterior right MCA territory. There is hypoattenuation of the periventricular white matter, most commonly indicating chronic ischemic microangiopathy. Skull: The visualized skull base, calvarium and extracranial soft tissues are normal. Sinuses/Orbits: No fluid levels or advanced mucosal thickening of the visualized paranasal sinuses. No mastoid or middle ear effusion. The orbits are normal. CTA NECK FINDINGS SKELETON: There is no bony spinal canal stenosis. No lytic or blastic lesion. OTHER NECK: Normal pharynx, larynx and major salivary glands.  No cervical lymphadenopathy. Unremarkable thyroid gland. UPPER CHEST: Dependent atelectasis in the left upper lobe. AORTIC ARCH: There is calcific atherosclerosis of the aortic arch. There is a stent within the aortic arch. Conventional 3 vessel aortic branching pattern. The left subclavian artery origin remains occluded. There is a  patent left common carotid to subclavian graft. RIGHT CAROTID SYSTEM:  Right ICA stent is patent. LEFT CAROTID SYSTEM: No dissection, occlusion or aneurysm. There is mixed density atherosclerosis extending into the proximal ICA, resulting in less than 50% stenosis. VERTEBRAL ARTERIES: Left dominant configuration. Both origins are clearly patent. There is no dissection, occlusion or flow-limiting stenosis to the skull base (V1-V3 segments). CTA HEAD FINDINGS POSTERIOR CIRCULATION: --Vertebral arteries: Normal V4 segments. --Inferior cerebellar arteries: Normal. --Basilar artery: Normal. --Superior cerebellar arteries: Normal. --Posterior cerebral arteries (PCA): Normal. ANTERIOR CIRCULATION: --Intracranial internal carotid arteries: Normal. --Anterior cerebral arteries (ACA): Normal. Both A1 segments are present. Patent anterior communicating artery (a-comm). --Middle cerebral arteries (MCA): Normal. VENOUS SINUSES: As permitted by contrast timing, patent. ANATOMIC VARIANTS: None Review of the MIP images confirms the above findings. IMPRESSION: 1. No emergent large vessel occlusion or high-grade stenosis of the intracranial arteries. 2. Patent right ICA stent. 3. Multiple old infarcts and findings of chronic ischemic microangiopathy. 4. Stent-treated thoracic aortic aneurysm. Aortic Atherosclerosis (ICD10-I70.0). Cerebral Atrophy (ICD10-G31.9). Electronically Signed   By: Ulyses Jarred M.D.   On: 06/15/2021 20:13     LOS: 2 days   Antonieta Pert, MD [facility]  06/17/2021, 8:08 AM

## 2021-06-17 NOTE — TOC Progression Note (Addendum)
Transition of Care [facility]) - Progression Note    Patient Details  Name: Erin Baker MRN: [id number] Date of Birth: 06/17/1941  [facility] CM/SW Contact  Carley Hammed, [place] Phone Number: 06/17/2021, 11:52 AM  Clinical Narrative:    CSW attempted to start insurance auth, Talbot Grumbling noting that the name in their system is [name], as is pt's insurance information. Pt's goes by middle name [name], and insurance can be searched under this name. Insurance authorization pending at this time. TOC will continue to follow for planned DC on Monday.   Expected Discharge Plan: Skilled Nursing Facility Barriers to Discharge: Continued Medical Work up  Expected Discharge Plan and Services Expected Discharge Plan: Skilled Nursing Facility In-house Referral: Clinical Social Work Discharge Planning Services: CM Consult Post Acute Care Choice: Skilled Nursing Facility Living arrangements for the past 2 months: Skilled Nursing Facility                                       Social Determinants of Health (SDOH) Interventions    Readmission Risk Interventions Readmission Risk Prevention Plan 04/13/2021  Transportation Screening Complete  PCP or Specialist Appt within 5-7 Days Complete  Home Care Screening Complete  Medication Review (RN CM) Complete

## 2021-06-18 DIAGNOSIS — I639 Cerebral infarction, unspecified: Secondary | ICD-10-CM | POA: Diagnosis not present

## 2021-06-18 LAB — GLUCOSE, CAPILLARY
Glucose-Capillary: 120 mg/dL — ABNORMAL HIGH (ref 70–99)
Glucose-Capillary: 163 mg/dL — ABNORMAL HIGH (ref 70–99)
Glucose-Capillary: 138 mg/dL — ABNORMAL HIGH (ref 70–99)
Glucose-Capillary: 217 mg/dL — ABNORMAL HIGH (ref 70–99)

## 2021-06-18 LAB — BASIC METABOLIC PANEL
Anion gap: 8 (ref 5–15)
BUN: 24 mg/dL — ABNORMAL HIGH (ref 8–23)
CO2: 23 mmol/L (ref 22–32)
Calcium: 8.3 mg/dL — ABNORMAL LOW (ref 8.9–10.3)
Chloride: 104 mmol/L (ref 98–111)
Creatinine, Ser: 1.32 mg/dL — ABNORMAL HIGH (ref 0.44–1.00)
GFR, Estimated: 41 mL/min — ABNORMAL LOW (ref >60)
Glucose, Bld: 165 mg/dL — ABNORMAL HIGH (ref 70–99)
Potassium: 5.2 mmol/L — ABNORMAL HIGH (ref 3.5–5.1)
Sodium: 135 mmol/L (ref 135–145)

## 2021-06-18 LAB — CBC
HCT: 30 % — ABNORMAL LOW (ref 36.0–46.0)
Hemoglobin: 8.9 g/dL — ABNORMAL LOW (ref 12.0–15.0)
MCH: 25.2 pg — ABNORMAL LOW (ref 26.0–34.0)
MCHC: 29.7 g/dL — ABNORMAL LOW (ref 30.0–36.0)
MCV: 85 fL (ref 80.0–100.0)
Platelets: 228 10*3/uL (ref 150–400)
RBC: 3.53 MIL/uL — ABNORMAL LOW (ref 3.87–5.11)
RDW: 17.5 % — ABNORMAL HIGH (ref 11.5–15.5)
WBC: 9.3 10*3/uL (ref 4.0–10.5)
nRBC: 0 % (ref 0.0–0.2)

## 2021-06-18 NOTE — Discharge Summary (Signed)
Physician Discharge Summary  Erin Baker BDZ:[id number] DOB: [birth year] DOA: 06/14/2021  PCP: Clemencia Course, PA-C  Admit date: 06/14/2021 Discharge date: 06/19/2021  Admitted From: home Disposition: SNF  Recommendations for Outpatient Follow-up:  Follow up with PCP in 1-2 weeks Please obtain BMP/CBC in one week   Home Health:NO  Equipment/Devices: NONE  Discharge Condition: Stable Code Status:   Code Status: Full Code Diet recommendation:  Diet Order             Diet Carb Modified Fluid consistency: Thin; Room service appropriate? Yes  Diet effective now                    Brief/Interim Summary: 80 y.o. female with PMH of  previous acute ischemic CVAs, hypertension, hypo-thyroidism, chronic iron deficiency anemia with baseline hemoglobin 8.5-10 g, chronic diastolic CHF presenting with left lower extremity weakness after she woke up at 5 AM 11/16 noticed new onset of left lower extremity weakness. She was brought to the ED work-up showed acute ischemic stroke as below also with anemia, neurology was consulted and patient was admitted for further management. Her MRI brain:Small focus of acute ischemia within the right corona radiata. No hemorrhage or mass effect. 2. Multiple old infarcts and findings of chronic ischemic microangiopathy. 3.  Cerebral Atrophy Seen by neurology completed a stroke work-up with LDL 51, CT angio head and neck with no LVO, patent right ICA stent multiple old infarcts and chronic ischemic microangiopathy and  stent treated thoracic aortic aneurysm. Patient at this time is stabilized plan for skilled nursing facility on Monday and she remains medically stable.  Discharge Diagnoses:   Acute ischemic stroke: MRI showing acute ischemia in the right corona radiata patient presented with left leg weakness.  Neurology on consult, LDL stable at 51, HbA1c 6.2, completed stroke work-up per neurology with CT angio head neck.  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 CT angio head and neck with no LVO, patent right ICA stent multiple old infarcts and chronic ischemic microangiopathy and  stent treated thoracic aortic aneurysm. Increasing aspirin from the 81 to 325 mg,no DAPT given anemia per neurology.  Continue Lipitor.  Will need 30-day event monitor and neurology to arrange.   Hypertension: Controlled,hold Metoprolol 2/2 bradycardia- we will do amlodipine 5 mg and PCP or SNF MD to titrate and adjust meds at SNF   Hypokalemia/Hypomagnesemia: k high at 5.2-but hemolyzed sample, rechecked and stable.   AKI: Renal function overall had improved slightly uptrending 1.3 but improved to 1.1.  encourage oral hydration Recent Labs  Lab 06/15/21 0258 06/16/21 0144 06/17/21 0200 06/18/21 0204 06/19/21 0234  BUN 22 21 23  24* 25*  CREATININE 1.15* 1.29* 1.19* 1.32* 1.18*    Normocytic anemia/chronic iron deficiency anemia: Anemia panel showed iron 26>53m normal folate.  She reports that she used to get regular blood transfusions last one 3 yrs ago,used to go to [facility].  She reports she  had endoscopy colonoscopy recently over 3 years ago for anemia and no causes were found.  S/P 1 unit PRBC and hemoglobin has appropriately improved.  Continue B12 and folate supplement. 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  Complete rocephin x 5 days.  Consults: neurology  Subjective: Aaox3, no new complaints  Discharge Exam: Vitals:   06/19/21 0514 06/19/21 0757  BP: (!) 165/88 (!) 142/65   Pulse: 63 (!) 58  Resp: 14 14  Temp: 98.6 F (37 C) 97.9 F (36.6 C)  SpO2: 98% 100%   General: Pt is alert, awake, not in acute distress Cardiovascular: RRR, S1/S2 +, no rubs, no gallops Respiratory: CTA bilaterally, no wheezing, no rhonchi Abdominal: Soft, NT, ND, bowel sounds + Extremities: no edema, no cyanosis  Discharge Instructions  Discharge Instructions     Ambulatory referral to Neurology   Complete by: As directed    Follow up with stroke clinic NP (Jessica Vanschaick or Cecille Rubin, if both not available, consider Zachery Dauer, or Ahern) at [facility] in about 4 weeks. Thanks.   Discharge instructions   Complete by: As directed    Please call call MD or return to ER for similar or worsening recurring problem that brought you to hospital or if any fever,nausea/vomiting,abdominal pain, uncontrolled pain, chest pain,  shortness of breath or any other alarming symptoms.  Please follow-up your doctor as instructed in a week time and call the office for appointment.  Please avoid alcohol, smoking, or any other illicit substance and maintain healthy habits including taking your regular medications as prescribed.  You were cared for by a hospitalist during your hospital stay. If you have any questions about your discharge medications or the care you received while you were in the hospital after you are discharged, you can call the unit and ask to speak with the hospitalist on call if the hospitalist that took care of you is not available.  Once you are discharged, your primary care physician will handle any further medical issues. Please note that NO REFILLS for any discharge medications will be authorized once you are discharged, as it is imperative that you return to your primary care physician (or establish a relationship with a primary care physician if you do not have one) for your aftercare needs so that they can reassess your need for medications and monitor your lab values    Increase activity slowly   Complete by: As directed       Allergies as of 06/19/2021       Reactions   Oxycodone-acetaminophen Nausea And Vomiting, Shortness Of Breath   Macrobid [nitrofurantoin] Swelling   Morphine Other (See Comments)   Hyper, Confusion   Neurontin [gabapentin]    Sulfa Antibiotics Swelling   Tape    Adhesive   Tramadol Other (See Comments)   hyper   Latex Rash   Levofloxacin Swelling, Rash        Medication List     STOP taking these medications    aspirin 81 MG chewable tablet Replaced by: aspirin 325 MG EC tablet       TAKE these medications    amLODipine 5 MG tablet Commonly known as: NORVASC Take 1 tablet (5 mg total) by mouth daily.   aspirin 325 MG EC tablet Take 1 tablet (325 mg total) by mouth daily. Replaces: aspirin 81 MG chewable tablet   atorvastatin 40 MG tablet Commonly known as: LIPITOR Take 40 mg by mouth daily.   calcium carbonate 500 MG chewable tablet Commonly known as: TUMS - dosed in mg elemental calcium Chew 2 tablets by  mouth every 4 (four) hours as needed for indigestion or heartburn.   cholecalciferol 25 MCG (1000 UNIT) tablet Commonly known as: VITAMIN D3 Take 1,000 Units by mouth daily.   cyanocobalamin 1000 MCG tablet Take 1 tablet (1,000 mcg total) by mouth daily.   diphenhydrAMINE 25 MG tablet Commonly known as: BENADRYL Take 50 mg by mouth at bedtime as needed for sleep.   docusate sodium 100 MG capsule Commonly known as: COLACE Take 100 mg by mouth daily.   famotidine 20 MG tablet Commonly known as: PEPCID Take 20 mg by mouth daily as needed for heartburn or indigestion.   ferrous sulfate 325 (65 FE) MG EC tablet Take 325 mg by mouth daily with breakfast.   furosemide 20 MG tablet Commonly known as: LASIX Take 20 mg by mouth 3 (three) times a week. Take on MWF   glipiZIDE 10 MG 24 hr tablet Commonly known as: GLUCOTROL XL Take 10 mg by mouth daily with breakfast.   Incruse Ellipta  62.5 MCG/ACT Aepb Generic drug: umeclidinium bromide Inhale 1 puff into the lungs daily.   Levemir FlexTouch 100 UNIT/ML FlexPen Generic drug: insulin detemir Inject 27 Units into the skin at bedtime.   levothyroxine 100 MCG tablet Commonly known as: SYNTHROID Take 100 mcg by mouth daily.   metoprolol tartrate 25 MG tablet Commonly known as: LOPRESSOR Take 25 mg by mouth 2 (two) times daily.   nitroGLYCERIN 0.4 MG SL tablet Commonly known as: NITROSTAT Place 0.4 mg under the tongue every 5 (five) minutes as needed for chest pain.   pantoprazole 40 MG tablet Commonly known as: PROTONIX Take 1 tablet by mouth 2 (two) times daily.   sertraline 50 MG tablet Commonly known as: ZOLOFT Take 50 mg by mouth daily.   Vitamin D (Ergocalciferol) 1.25 MG (50000 UNIT) Caps capsule Commonly known as: DRISDOL Take 50,000 Units by mouth every 7 (seven) days. Every Friday        Follow-up Information     [facility] Follow up.   Specialty: Neurology Contact information: [address] [place], [place] Follow up in 1 week(s).   Specialty: Internal Medicine Contact information: [id number] [place] [address] [phone]                Allergies  Allergen Reactions   Oxycodone-Acetaminophen Nausea And Vomiting and Shortness Of Breath   Macrobid [Nitrofurantoin] Swelling   Morphine Other (See Comments)    Hyper, Confusion   Neurontin [Gabapentin]    Sulfa Antibiotics Swelling   Tape     Adhesive   Tramadol Other (See Comments)    hyper   Latex Rash   Levofloxacin Swelling and Rash    The results of significant diagnostics from this hospitalization (including imaging, microbiology, ancillary and laboratory) are listed below for reference.    Microbiology: Recent Results (from the past 240 hour(s))  Resp Panel by RT-PCR (Flu A&B, Covid)  Nasopharyngeal Swab     Status: None   Collection Time: 06/14/21  9:35 PM   Specimen: Nasopharyngeal Swab; Nasopharyngeal(NP) swabs in vial transport medium  Result Value Ref Range Status   SARS Coronavirus 2 by RT PCR NEGATIVE NEGATIVE Final    Comment: (NOTE) SARS-CoV-2 target nucleic acids are NOT DETECTED.  The SARS-CoV-2 RNA is generally detectable in upper respiratory specimens during the acute phase of infection. The lowest concentration of SARS-CoV-2 viral copies  this assay can detect is 138 copies/mL. A negative result does not preclude SARS-Cov-2 infection and should not be used as the sole basis for treatment or other patient management decisions. A negative result may occur with  improper specimen collection/handling, submission of specimen other than nasopharyngeal swab, presence of viral mutation(s) within the areas targeted by this assay, and inadequate number of viral copies(<138 copies/mL). A negative result must be combined with clinical observations, patient history, and epidemiological information. The expected result is Negative.  Fact Sheet for Patients:  EntrepreneurPulse.com.au  Fact Sheet for Healthcare Providers:  IncredibleEmployment.be  This test is no t yet approved or cleared by the [place] FDA and  has been authorized for detection and/or diagnosis of SARS-CoV-2 by FDA under an Emergency Use Authorization (EUA). This EUA will remain  in effect (meaning this test can be used) for the duration of the COVID-19 declaration under Section 564(b)(1) of the Act, 21 U.S.C.section 360bbb-3(b)(1), unless the authorization is terminated  or revoked sooner.       Influenza A by PCR NEGATIVE NEGATIVE Final   Influenza B by PCR NEGATIVE NEGATIVE Final    Comment: (NOTE) The Xpert Xpress SARS-CoV-2/FLU/RSV plus assay is intended as an aid in the diagnosis of influenza from Nasopharyngeal swab specimens and should not be  used as a sole basis for treatment. Nasal washings and aspirates are unacceptable for Xpert Xpress SARS-CoV-2/FLU/RSV testing.  Fact Sheet for Patients: EntrepreneurPulse.com.au  Fact Sheet for Healthcare Providers: IncredibleEmployment.be  This test is not yet approved or cleared by the [place] FDA and has been authorized for detection and/or diagnosis of SARS-CoV-2 by FDA under an Emergency Use Authorization (EUA). This EUA will remain in effect (meaning this test can be used) for the duration of the COVID-19 declaration under Section 564(b)(1) of the Act, 21 U.S.C. section 360bbb-3(b)(1), unless the authorization is terminated or revoked.  Performed at [facility], [place] [address], [place], [place] [address]   SARS CORONAVIRUS 2 (TAT 6-24 HRS) Nasopharyngeal Nasopharyngeal Swab     Status: None   Collection Time: 06/18/21 11:30 AM   Specimen: Nasopharyngeal Swab  Result Value Ref Range Status   SARS Coronavirus 2 NEGATIVE NEGATIVE Final    Comment: (NOTE) SARS-CoV-2 target nucleic acids are NOT DETECTED.  The SARS-CoV-2 RNA is generally detectable in upper and lower respiratory specimens during the acute phase of infection. Negative results do not preclude SARS-CoV-2 infection, do not rule out co-infections with other pathogens, and should not be used as the sole basis for treatment or other patient management decisions. Negative results must be combined with clinical observations, patient history, and epidemiological information. The expected result is Negative.  Fact Sheet for Patients: SugarRoll.be  Fact Sheet for Healthcare Providers: https://www.woods-mathews.com/  This test is not yet approved or cleared by the [place] FDA and  has been authorized for detection and/or diagnosis of SARS-CoV-2 by FDA under an Emergency Use Authorization (EUA). This EUA will remain  in  effect (meaning this test can be used) for the duration of the COVID-19 declaration under Se ction 564(b)(1) of the Act, 21 U.S.C. section 360bbb-3(b)(1), unless the authorization is terminated or revoked sooner.  Performed at [facility], [place] [address], [place], [place] [address]     Procedures/Studies: CT ANGIO HEAD NECK W WO CM  Result Date: 06/15/2021 CLINICAL DATA:  Stroke follow-up EXAM: CT ANGIOGRAPHY HEAD AND NECK TECHNIQUE: Multidetector CT imaging of the head and neck was performed using the  standard protocol during bolus administration of intravenous contrast. Multiplanar CT image reconstructions and MIPs were obtained to evaluate the vascular anatomy. Carotid stenosis measurements (when applicable) are obtained utilizing NASCET criteria, using the distal internal carotid diameter as the denominator. CONTRAST:  80mL OMNIPAQUE IOHEXOL 350 MG/ML SOLN COMPARISON:  None. FINDINGS: CT HEAD FINDINGS Brain: There is no mass, hemorrhage or extra-axial collection. There is generalized brain atrophy greater than expected at this age. There are multiple old infarcts, greatest in the posterior right MCA territory. There is hypoattenuation of the periventricular white matter, most commonly indicating chronic ischemic microangiopathy. Skull: The visualized skull base, calvarium and extracranial soft tissues are normal. Sinuses/Orbits: No fluid levels or advanced mucosal thickening of the visualized paranasal sinuses. No mastoid or middle ear effusion. The orbits are normal. CTA NECK FINDINGS SKELETON: There is no bony spinal canal stenosis. No lytic or blastic lesion. OTHER NECK: Normal pharynx, larynx and major salivary glands. No cervical lymphadenopathy. Unremarkable thyroid gland. UPPER CHEST: Dependent atelectasis in the left upper lobe. AORTIC ARCH: There is calcific atherosclerosis of the aortic arch. There is a stent within the aortic arch. Conventional 3 vessel aortic branching pattern.  The left subclavian artery origin remains occluded. There is a patent left common carotid to subclavian graft. RIGHT CAROTID SYSTEM:  Right ICA stent is patent. LEFT CAROTID SYSTEM: No dissection, occlusion or aneurysm. There is mixed density atherosclerosis extending into the proximal ICA, resulting in less than 50% stenosis. VERTEBRAL ARTERIES: Left dominant configuration. Both origins are clearly patent. There is no dissection, occlusion or flow-limiting stenosis to the skull base (V1-V3 segments). CTA HEAD FINDINGS POSTERIOR CIRCULATION: --Vertebral arteries: Normal V4 segments. --Inferior cerebellar arteries: Normal. --Basilar artery: Normal. --Superior cerebellar arteries: Normal. --Posterior cerebral arteries (PCA): Normal. ANTERIOR CIRCULATION: --Intracranial internal carotid arteries: Normal. --Anterior cerebral arteries (ACA): Normal. Both A1 segments are present. Patent anterior communicating artery (a-comm). --Middle cerebral arteries (MCA): Normal. VENOUS SINUSES: As permitted by contrast timing, patent. ANATOMIC VARIANTS: None Review of the MIP images confirms the above findings. IMPRESSION: 1. No emergent large vessel occlusion or high-grade stenosis of the intracranial arteries. 2. Patent right ICA stent. 3. Multiple old infarcts and findings of chronic ischemic microangiopathy. 4. Stent-treated thoracic aortic aneurysm. Aortic Atherosclerosis (ICD10-I70.0). Cerebral Atrophy (ICD10-G31.9). Electronically Signed   By: Deatra RobinsonKevin  Herman M.D.   On: 06/15/2021 20:13   DG Chest 2 View  Result Date: 05/22/2021 CLINICAL DATA:  Shortness of breath EXAM: CHEST - 2 VIEW COMPARISON:  Chest x-ray dated April 05, 2021 FINDINGS: Cardiac and mediastinal contours are unchanged. Prior stent graft repair of the aortic arch and descending thoracic aorta. Loop recorder noted. Unchanged elevation of the left hemidiaphragm. Lungs are clear. No pleural effusion or pneumothorax. IMPRESSION: No active cardiopulmonary  disease. Electronically Signed   By: Allegra LaiLeah  Strickland M.D.   On: 05/22/2021 20:34   CT HEAD WO CONTRAST  Result Date: 06/14/2021 CLINICAL DATA:  Neuro deficit, acute, stroke suspected new onset of left leg weakness over night. EXAM: CT HEAD WITHOUT CONTRAST TECHNIQUE: Contiguous axial images were obtained from the base of the skull through the vertex without intravenous contrast. COMPARISON:  MRI 04/05/2021 FINDINGS: Brain: There are multiple old infarctions scattered throughout the brain. No abnormality seen affecting the brainstem. Multiple old bilateral cerebellar infarctions. Cerebral hemispheres show widespread chronic small-vessel ischemic changes of the white matter and numerous old cortical and subcortical infarctions, more numerous in the right hemisphere than the left. None of these are identifiably acute when compared to the  studies of earlier this year. No mass, hemorrhage, hydrocephalus or extra-axial collection. Vascular: There is atherosclerotic calcification of the major vessels at the base of the brain. Skull: Negative Sinuses/Orbits: Clear/normal Other: None IMPRESSION: No acute finding by CT. Extensive old ischemic changes throughout the cerebellum and cerebral hemispheres as outlined above, without identifiable acute infarction or hemorrhage. Certainly, a small acute insult could be hidden within the extensive chronic changes. Electronically Signed   By: Nelson Chimes M.D.   On: 06/14/2021 16:16   MR BRAIN WO CONTRAST  Result Date: 06/14/2021 CLINICAL DATA:  Acute neurologic deficit EXAM: MRI HEAD WITHOUT CONTRAST TECHNIQUE: Multiplanar, multiecho pulse sequences of the brain and surrounding structures were obtained without intravenous contrast. COMPARISON:  None. FINDINGS: Brain: Small focus of acute ischemia within the right corona radiata. No acute or chronic hemorrhage. Multiple old infarcts of the cerebellum, right occipital lobe, right frontal lobe and the corona radiata/centrum  semiovale. Diffuse generalized atrophy. Confluent white matter hyperintensity. The midline structures are normal. Vascular: Major flow voids are preserved. Skull and upper cervical spine: Normal calvarium and skull base. Visualized upper cervical spine and soft tissues are normal. Sinuses/Orbits:No paranasal sinus fluid levels or advanced mucosal thickening. No mastoid or middle ear effusion. Normal orbits. IMPRESSION: 1. Small focus of acute ischemia within the right corona radiata. No hemorrhage or mass effect. 2. Multiple old infarcts and findings of chronic ischemic microangiopathy. 3.  Cerebral Atrophy (ICD10-G31.9). Electronically Signed   By: Ulyses Jarred M.D.   On: 06/14/2021 20:22    Labs: BNP (last 3 results) No results for input(s): BNP in the last 8760 hours. Basic Metabolic Panel: Recent Labs  Lab 06/14/21 2156 06/15/21 0258 06/16/21 0144 06/17/21 0200 06/18/21 0204 06/19/21 0234  NA  --  136 137 136 135 135  K  --  3.4* 3.9 4.3 5.2* 4.5  CL  --  103 104 105 104 104  CO2  --  23 26 25 23 24   GLUCOSE  --  270* 136* 163* 165* 175*  BUN  --  22 21 23  24* 25*  CREATININE  --  1.15* 1.29* 1.19* 1.32* 1.18*  CALCIUM  --  8.3* 8.4* 8.7* 8.3* 8.3*  MG 1.7 1.6*  --   --   --   --    Liver Function Tests: Recent Labs  Lab 06/14/21 1529 06/15/21 0258  AST 13* 14*  ALT 12 9  ALKPHOS 90 78  BILITOT 0.9 0.7  PROT 5.9* 5.1*  ALBUMIN 2.7* 2.3*   No results for input(s): LIPASE, AMYLASE in the last 168 hours. No results for input(s): AMMONIA in the last 168 hours. CBC: Recent Labs  Lab 06/14/21 1529 06/14/21 1545 06/15/21 0258 06/15/21 0916 06/15/21 1628 06/16/21 0144 06/16/21 1451 06/17/21 0200 06/18/21 0204  WBC 10.0  --  8.5  --   --  7.5  --  8.9 9.3  NEUTROABS 7.4  --   --   --   --   --   --   --   --   HGB 7.8*   < > 7.0*   < > 7.4* 7.2* 9.3* 9.0* 8.9*  HCT 27.2*   < > 24.3*   < > 24.9* 24.4* 30.2* 29.1* 30.0*  MCV 85.3  --  83.5  --   --  83.3  --  83.6 85.0   PLT 230  --  215  --   --  227  --  204 228   < > =  values in this interval not displayed.   Cardiac Enzymes: No results for input(s): CKTOTAL, CKMB, CKMBINDEX, TROPONINI in the last 168 hours. BNP: Invalid input(s): POCBNP CBG: Recent Labs  Lab 06/18/21 0631 06/18/21 1153 06/18/21 1628 06/18/21 2222 06/19/21 0654  GLUCAP 120* 163* 138* 217* 134*   D-Dimer No results for input(s): DDIMER in the last 72 hours. Hgb A1c No results for input(s): HGBA1C in the last 72 hours. Lipid Profile No results for input(s): CHOL, HDL, LDLCALC, TRIG, CHOLHDL, LDLDIRECT in the last 72 hours. Thyroid function studies No results for input(s): TSH, T4TOTAL, T3FREE, THYROIDAB in the last 72 hours.  Invalid input(s): FREET3 Anemia work up No results for input(s): VITAMINB12, FOLATE, FERRITIN, TIBC, IRON, RETICCTPCT in the last 72 hours. Urinalysis    Component Value Date/Time   COLORURINE YELLOW 06/14/2021 1521   APPEARANCEUR HAZY (A) 06/14/2021 1521   LABSPEC 1.010 06/14/2021 1521   PHURINE 7.0 06/14/2021 1521   GLUCOSEU NEGATIVE 06/14/2021 1521   HGBUR NEGATIVE 06/14/2021 1521   BILIRUBINUR NEGATIVE 06/14/2021 1521   KETONESUR NEGATIVE 06/14/2021 1521   PROTEINUR NEGATIVE 06/14/2021 1521   NITRITE NEGATIVE 06/14/2021 1521   LEUKOCYTESUR LARGE (A) 06/14/2021 1521   Sepsis Labs Invalid input(s): PROCALCITONIN,  WBC,  LACTICIDVEN Microbiology Recent Results (from the past 240 hour(s))  Resp Panel by RT-PCR (Flu A&B, Covid) Nasopharyngeal Swab     Status: None   Collection Time: 06/14/21  9:35 PM   Specimen: Nasopharyngeal Swab; Nasopharyngeal(NP) swabs in vial transport medium  Result Value Ref Range Status   SARS Coronavirus 2 by RT PCR NEGATIVE NEGATIVE Final    Comment: (NOTE) SARS-CoV-2 target nucleic acids are NOT DETECTED.  The SARS-CoV-2 RNA is generally detectable in upper respiratory specimens during the acute phase of infection. The lowest concentration of SARS-CoV-2  viral copies this assay can detect is 138 copies/mL. A negative result does not preclude SARS-Cov-2 infection and should not be used as the sole basis for treatment or other patient management decisions. A negative result may occur with  improper specimen collection/handling, submission of specimen other than nasopharyngeal swab, presence of viral mutation(s) within the areas targeted by this assay, and inadequate number of viral copies(<138 copies/mL). A negative result must be combined with clinical observations, patient history, and epidemiological information. The expected result is Negative.  Fact Sheet for Patients:  BloggerCourse.com  Fact Sheet for Healthcare Providers:  SeriousBroker.it  This test is no t yet approved or cleared by the [place] FDA and  has been authorized for detection and/or diagnosis of SARS-CoV-2 by FDA under an Emergency Use Authorization (EUA). This EUA will remain  in effect (meaning this test can be used) for the duration of the COVID-19 declaration under Section 564(b)(1) of the Act, 21 U.S.C.section 360bbb-3(b)(1), unless the authorization is terminated  or revoked sooner.       Influenza A by PCR NEGATIVE NEGATIVE Final   Influenza B by PCR NEGATIVE NEGATIVE Final    Comment: (NOTE) The Xpert Xpress SARS-CoV-2/FLU/RSV plus assay is intended as an aid in the diagnosis of influenza from Nasopharyngeal swab specimens and should not be used as a sole basis for treatment. Nasal washings and aspirates are unacceptable for Xpert Xpress SARS-CoV-2/FLU/RSV testing.  Fact Sheet for Patients: BloggerCourse.com  Fact Sheet for Healthcare Providers: SeriousBroker.it  This test is not yet approved or cleared by the [place] FDA and has been authorized for detection and/or diagnosis of SARS-CoV-2 by FDA under an Emergency Use Authorization  (EUA). This  EUA will remain in effect (meaning this test can be used) for the duration of the COVID-19 declaration under Section 564(b)(1) of the Act, 21 U.S.C. section 360bbb-3(b)(1), unless the authorization is terminated or revoked.  Performed at [facility], [place] [address], [place], [place] [address]   SARS CORONAVIRUS 2 (TAT 6-24 HRS) Nasopharyngeal Nasopharyngeal Swab     Status: None   Collection Time: 06/18/21 11:30 AM   Specimen: Nasopharyngeal Swab  Result Value Ref Range Status   SARS Coronavirus 2 NEGATIVE NEGATIVE Final    Comment: (NOTE) SARS-CoV-2 target nucleic acids are NOT DETECTED.  The SARS-CoV-2 RNA is generally detectable in upper and lower respiratory specimens during the acute phase of infection. Negative results do not preclude SARS-CoV-2 infection, do not rule out co-infections with other pathogens, and should not be used as the sole basis for treatment or other patient management decisions. Negative results must be combined with clinical observations, patient history, and epidemiological information. The expected result is Negative.  Fact Sheet for Patients: SugarRoll.be  Fact Sheet for Healthcare Providers: https://www.woods-mathews.com/  This test is not yet approved or cleared by the [place] FDA and  has been authorized for detection and/or diagnosis of SARS-CoV-2 by FDA under an Emergency Use Authorization (EUA). This EUA will remain  in effect (meaning this test can be used) for the duration of the COVID-19 declaration under Se ction 564(b)(1) of the Act, 21 U.S.C. section 360bbb-3(b)(1), unless the authorization is terminated or revoked sooner.  Performed at [facility], [place] [address], [place], [place] [address]      Time coordinating discharge: 35 minutes  SIGNED: Antonieta Pert, MD  [facility] 06/19/2021, 8:29 AM  If 7PM-7AM, please contact  night-coverage [email or web address]

## 2021-06-18 NOTE — TOC Progression Note (Addendum)
Transition of Care [facility]) - Progression Note    Patient Details  Name: Erin Baker MRN: [id number] Date of Birth: [birth year]  [facility] CM/SW Contact  Carley Hammed, [place] Phone Number: 06/18/2021, 11:20 AM  Clinical Narrative:     Pt's insurance authorization has been approved with a start date of 11/21 through 11/23. Covid ordered, plan to DC tomorrow if medically stable. Navi # [id number]   Expected Discharge Plan: Skilled Nursing Facility Barriers to Discharge: Continued Medical Work up  Expected Discharge Plan and Services Expected Discharge Plan: Skilled Nursing Facility In-house Referral: Clinical Social Work Discharge Planning Services: CM Consult Post Acute Care Choice: Skilled Nursing Facility Living arrangements for the past 2 months: Skilled Nursing Facility                                       Social Determinants of Health (SDOH) Interventions    Readmission Risk Interventions Readmission Risk Prevention Plan 04/13/2021  Transportation Screening Complete  PCP or Specialist Appt within 5-7 Days Complete  Home Care Screening Complete  Medication Review (RN CM) Complete

## 2021-06-18 NOTE — Progress Notes (Signed)
PROGRESS NOTE    Erin Baker  KGU:[id number] DOB: [birth year] DOA: 06/14/2021 PCP: Clemencia Course, PA-C    Brief Narrative/Hospital Course:  Erin Baker, 80 y.o. female with PMH of  previous acute ischemic CVAs, hypertension, hypo-thyroidism, chronic iron deficiency anemia with baseline hemoglobin 8.5-10 g, chronic diastolic CHF presenting with left lower extremity weakness after she woke up at 5 AM 11/16 noticed new onset of left lower extremity weakness. She was brought to the ED work-up showed acute ischemic stroke as below also with anemia, neurology was consulted and patient was admitted for further management. Her MRI brain:Small focus of acute ischemia within the right corona radiata. No hemorrhage or mass effect. 2. Multiple old infarcts and findings of chronic ischemic microangiopathy. 3.  Cerebral Atrophy Seen by neurology completed a stroke work-up with LDL 51, CT angio head and neck with no LVO, patent right ICA stent multiple old infarcts and chronic ischemic microangiopathy and  stent treated thoracic aortic aneurysm. Patient at this time is stabilized plan for skilled nursing facility on Monday and remains medically stable.  Subjective: Seen examined No  new complaints Left leg still weak but moving some what more now No acute events overnight  Assessment & Plan:  Acute ischemic stroke: MRI showing acute ischemia in the right corona radiata patient presented with left leg weakness.  Neurology on consult, LDL stable at 51, HbA1c 6.2, completed stroke work-up per neurology with CT angio head neck.  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 CT angio head and neck with no LVO, patent right ICA stent multiple old infarcts and chronic ischemic microangiopathy and  stent treated thoracic  aortic aneurysm. Increasing aspirin from the 81 to 325 mg,no DAPT given anemia per neurology.  Continue Lipitor.  Will need 30-day event monitor and neurology to arrange.  Hypertension: Controlled,continue metoprolol.  Hypokalemia/Hypomagnesemia: k high at 5.2-bu hemolyzed sample, recheck in am  AKI: Renal function overall had improved slightly uptrending 1.3 this morning keep IV fluids repeat labs  Recent Labs  Lab 06/14/21 1545 06/15/21 0258 06/16/21 0144 06/17/21 0200 06/18/21 0204  BUN 24* 22 21 23  24*  CREATININE 1.50* 1.15* 1.29* 1.19* 1.32*    Normocytic anemia/chronic iron deficiency anemia: Anemia panel showed iron 26>81m normal folate.  She reports that she used to get regular blood transfusions last one 3 yrs ago,used to go to [facility].  She reports she  had endoscopy colonoscopy recently over 3 years ago for anemia and no causes were found.  S/P 1 unit PRBC and hemoglobin has appropriately improved.  Continue B12 and folate supplement. Recent Labs  Lab 06/15/21 1628 06/16/21 0144 06/16/21 1451 06/17/21 0200 06/18/21 0204  HGB 7.4* 7.2* 9.3* 9.0* 8.9*  HCT 24.9* 24.4* 30.2* 29.1* 30.0*     T 2 DM with vascular disease: HbA1c 6.2, stable, continue continue Levemir 15U and ssi Recent Labs  Lab 06/17/21 0926 06/17/21 1304 06/17/21 1652 06/17/21 2128 06/18/21 0631  GLUCAP 189* 133* 145* 153* 120*     Hypothyroidism: Continue on Synthroid  06/20/21 dysurea-UA significantly abnormal.  Complete rocephin x 5 days.  DVT prophylaxis: SCDs Start: 06/14/21 2139 Code Status:   Code Status: Full Code Family Communication: plan of care discussed with patient at bedside. Status is: Inpatient Remains hospitalized for ongoing management of acute CVA,Anemia UTI  disposition: Currently  medically stable for discharge. Anticipated Disposition: SNF Monday once insurance approves   Objective: Vitals last 24 hrs: Vitals:   06/17/21 2344 06/18/21 0357  06/18/21 0521 06/18/21 0736  BP: (!) 129/56 (!) 144/49    Pulse: 62 62    Resp: 19 18    Temp: 98.4 F (36.9 C) 97.8 F (36.6 C)    TempSrc: Oral Oral    SpO2: 96% 97%  96%  Weight:   72.7 kg   Height:       Weight change:   Intake/Output Summary (Last 24 hours) at 06/18/2021 0748 Last data filed at 06/18/2021 0357 Gross per 24 hour  Intake --  Output 1025 ml  Net -1025 ml    Net IO Since Admission: -175 mL [06/18/21 0748]   Physical Examination: General exam: AAOx 3, pleasant. HEENT:Oral mucosa moist, Ear/Nose WNL grossly, dentition normal. Respiratory system: bilaterally diminished, no use of accessory muscle Cardiovascular system: S1 & S2 +, No JVD,. Gastrointestinal system: Abdomen soft,NT,ND, BS+ Nervous System:Alert, awake, moving extremities and weak LLE Extremities: NO edema, distal peripheral pulses palpable.  Skin: No rashes,no icterus. MSK: Normal muscle bulk,tone, power   Medications reviewed:  Scheduled Meds:  aspirin EC  325 mg Oral Daily   atorvastatin  40 mg Oral Daily   ferrous sulfate  325 mg Oral Q breakfast   insulin aspart  0-9 Units Subcutaneous TID WC   insulin detemir  15 Units Subcutaneous QHS   levothyroxine  100 mcg Oral Daily   metoprolol tartrate  25 mg Oral BID   pantoprazole  40 mg Oral BID   polyethylene glycol  17 g Oral Daily   sertraline  25 mg Oral Daily   umeclidinium bromide  1 puff Inhalation Daily   vitamin B-12  1,000 mcg Oral Daily   Continuous Infusions:   Diet Order             Diet Carb Modified Fluid consistency: Thin; Room service appropriate? Yes  Diet effective now                 Weight change:   Wt Readings from Last 3 Encounters:  06/18/21 72.7 kg  04/12/21 74.9 kg     Consultants: neurology Procedures:see note Antimicrobials: Anti-infectives (From admission, onward)    Start     Dose/Rate Route Frequency Ordered Stop   06/15/21 1030  cefTRIAXone (ROCEPHIN) 1 g in sodium chloride 0.9 % 100  mL IVPB        1 g 200 mL/hr over 30 Minutes Intravenous Every 24 hours 06/15/21 0940 06/17/21 0931      Culture/Microbiology    Component Value Date/Time   SDES BLOOD RIGHT HAND 04/06/2021 0104   SPECREQUEST  04/06/2021 0104    BOTTLES DRAWN AEROBIC AND ANAEROBIC Blood Culture adequate volume   CULT  04/06/2021 0104    NO GROWTH 5 DAYS Performed at [facility], [address], [place], [place] [address]    REPTSTATUS 04/11/2021 FINAL 04/06/2021 0104    Other culture-see note  Unresulted Labs (From admission, onward)     Start     Ordered   06/17/21 0500  Basic metabolic panel  Daily,   R     Question:  Specimen collection method  Answer:  Lab=Lab collect   06/16/21 0745   06/17/21 0500  CBC  Daily,   R     Question:  Specimen collection method  Answer:  Lab=Lab collect   06/16/21 0745   06/15/21  0845  Occult blood card to lab, stool  ONCE - STAT,   STAT        06/15/21 0844           Data Reviewed: I have personally reviewed following labs and imaging studies CBC: Recent Labs  Lab 06/14/21 1529 06/14/21 1545 06/15/21 0258 06/15/21 0916 06/15/21 1628 06/16/21 0144 06/16/21 1451 06/17/21 0200 06/18/21 0204  WBC 10.0  --  8.5  --   --  7.5  --  8.9 9.3  NEUTROABS 7.4  --   --   --   --   --   --   --   --   HGB 7.8*   < > 7.0*   < > 7.4* 7.2* 9.3* 9.0* 8.9*  HCT 27.2*   < > 24.3*   < > 24.9* 24.4* 30.2* 29.1* 30.0*  MCV 85.3  --  83.5  --   --  83.3  --  83.6 85.0  PLT 230  --  215  --   --  227  --  204 228   < > = values in this interval not displayed.    Basic Metabolic Panel: Recent Labs  Lab 06/14/21 1529 06/14/21 1545 06/14/21 2156 06/15/21 0258 06/16/21 0144 06/17/21 0200 06/18/21 0204  NA 136 138  --  136 137 136 135  K 3.8 3.8  --  3.4* 3.9 4.3 5.2*  CL 101 101  --  103 104 105 104  CO2 26  --   --  23 26 25 23   GLUCOSE 177* 173*  --  270* 136* 163* 165*  BUN 23 24*  --  22 21 23  24*  CREATININE 1.41* 1.50*  --  1.15* 1.29* 1.19*  1.32*  CALCIUM 8.6*  --   --  8.3* 8.4* 8.7* 8.3*  MG  --   --  1.7 1.6*  --   --   --     GFR: Estimated Creatinine Clearance: 35.5 mL/min (A) (by C-G formula based on SCr of 1.32 mg/dL (H)). Liver Function Tests: Recent Labs  Lab 06/14/21 1529 06/15/21 0258  AST 13* 14*  ALT 12 9  ALKPHOS 90 78  BILITOT 0.9 0.7  PROT 5.9* 5.1*  ALBUMIN 2.7* 2.3*    No results for input(s): LIPASE, AMYLASE in the last 168 hours. No results for input(s): AMMONIA in the last 168 hours. Coagulation Profile: Recent Labs  Lab 06/14/21 1529  INR 1.3*    Cardiac Enzymes: No results for input(s): CKTOTAL, CKMB, CKMBINDEX, TROPONINI in the last 168 hours. BNP (last 3 results) No results for input(s): PROBNP in the last 8760 hours. HbA1C: No results for input(s): HGBA1C in the last 72 hours.  CBG: Recent Labs  Lab 06/17/21 0926 06/17/21 1304 06/17/21 1652 06/17/21 2128 06/18/21 0631  GLUCAP 189* 133* 145* 153* 120*    Lipid Profile: No results for input(s): CHOL, HDL, LDLCALC, TRIG, CHOLHDL, LDLDIRECT in the last 72 hours.  Thyroid Function Tests: No results for input(s): TSH, T4TOTAL, FREET4, T3FREE, THYROIDAB in the last 72 hours. Anemia Panel: Recent Labs    06/15/21 0916  VITAMINB12 243  FOLATE 9.6  FERRITIN 32  TIBC 305  IRON 43  RETICCTPCT 2.7    Sepsis Labs: No results for input(s): PROCALCITON, LATICACIDVEN in the last 168 hours.  Recent Results (from the past 240 hour(s))  Resp Panel by RT-PCR (Flu A&B, Covid) Nasopharyngeal Swab     Status: None   Collection Time: 06/14/21  9:35 PM   Specimen: Nasopharyngeal Swab; Nasopharyngeal(NP) swabs in vial transport medium  Result Value Ref Range Status   SARS Coronavirus 2 by RT PCR NEGATIVE NEGATIVE Final    Comment: (NOTE) SARS-CoV-2 target nucleic acids are NOT DETECTED.  The SARS-CoV-2 RNA is generally detectable in upper respiratory specimens during the acute phase of infection. The lowest concentration of  SARS-CoV-2 viral copies this assay can detect is 138 copies/mL. A negative result does not preclude SARS-Cov-2 infection and should not be used as the sole basis for treatment or other patient management decisions. A negative result may occur with  improper specimen collection/handling, submission of specimen other than nasopharyngeal swab, presence of viral mutation(s) within the areas targeted by this assay, and inadequate number of viral copies(<138 copies/mL). A negative result must be combined with clinical observations, patient history, and epidemiological information. The expected result is Negative.  Fact Sheet for Patients:  BloggerCourse.com  Fact Sheet for Healthcare Providers:  SeriousBroker.it  This test is no t yet approved or cleared by the [place] FDA and  has been authorized for detection and/or diagnosis of SARS-CoV-2 by FDA under an Emergency Use Authorization (EUA). This EUA will remain  in effect (meaning this test can be used) for the duration of the COVID-19 declaration under Section 564(b)(1) of the Act, 21 U.S.C.section 360bbb-3(b)(1), unless the authorization is terminated  or revoked sooner.       Influenza A by PCR NEGATIVE NEGATIVE Final   Influenza B by PCR NEGATIVE NEGATIVE Final    Comment: (NOTE) The Xpert Xpress SARS-CoV-2/FLU/RSV plus assay is intended as an aid in the diagnosis of influenza from Nasopharyngeal swab specimens and should not be used as a sole basis for treatment. Nasal washings and aspirates are unacceptable for Xpert Xpress SARS-CoV-2/FLU/RSV testing.  Fact Sheet for Patients: BloggerCourse.com  Fact Sheet for Healthcare Providers: SeriousBroker.it  This test is not yet approved or cleared by the [place] FDA and has been authorized for detection and/or diagnosis of SARS-CoV-2 by FDA under an Emergency Use  Authorization (EUA). This EUA will remain in effect (meaning this test can be used) for the duration of the COVID-19 declaration under Section 564(b)(1) of the Act, 21 U.S.C. section 360bbb-3(b)(1), unless the authorization is terminated or revoked.  Performed at [facility], [address] [place], [place] [address]       Radiology Studies: No results found.   LOS: 3 days   Lanae Boast, MD [facility]  06/18/2021, 7:48 AM

## 2021-06-19 DIAGNOSIS — I639 Cerebral infarction, unspecified: Secondary | ICD-10-CM | POA: Diagnosis not present

## 2021-06-19 LAB — BASIC METABOLIC PANEL
Anion gap: 7 (ref 5–15)
BUN: 25 mg/dL — ABNORMAL HIGH (ref 8–23)
CO2: 24 mmol/L (ref 22–32)
Calcium: 8.3 mg/dL — ABNORMAL LOW (ref 8.9–10.3)
Chloride: 104 mmol/L (ref 98–111)
Creatinine, Ser: 1.18 mg/dL — ABNORMAL HIGH (ref 0.44–1.00)
GFR, Estimated: 47 mL/min — ABNORMAL LOW (ref >60)
Glucose, Bld: 175 mg/dL — ABNORMAL HIGH (ref 70–99)
Potassium: 4.5 mmol/L (ref 3.5–5.1)
Sodium: 135 mmol/L (ref 135–145)

## 2021-06-19 LAB — SARS CORONAVIRUS 2 (TAT 6-24 HRS): SARS Coronavirus 2: NEGATIVE

## 2021-06-19 LAB — GLUCOSE, CAPILLARY: Glucose-Capillary: 134 mg/dL — ABNORMAL HIGH (ref 70–99)

## 2021-06-19 MED ORDER — ASPIRIN 325 MG PO TBEC: 325.0000 mg | DELAYED_RELEASE_TABLET | Freq: Every day | ORAL | 0 refills | Status: AC

## 2021-06-19 MED ORDER — AMLODIPINE BESYLATE 5 MG PO TABS
5.0000 mg | ORAL_TABLET | Freq: Every day | ORAL | Status: DC
  Administered 2021-06-19: 5 mg via ORAL
  Filled 2021-06-19: qty 1

## 2021-06-19 MED ORDER — CYANOCOBALAMIN 1000 MCG PO TABS: 1000.0000 ug | ORAL_TABLET | Freq: Every day | ORAL | Status: DC

## 2021-06-19 MED ORDER — AMLODIPINE BESYLATE 5 MG PO TABS: 5.0000 mg | ORAL_TABLET | Freq: Every day | ORAL | Status: DC

## 2021-06-19 NOTE — Progress Notes (Signed)
Physical Therapy Treatment Patient Details Name: Erin Baker MRN: [id number] DOB: [birth year] Today's Date: 06/19/2021   History of Present Illness Pt is a 80 y/o F Admitted on 11/16 due to L sided weakness. 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    PT Comments    Pt progressing towards physical therapy goals. Was able to perform transfers and ambulation with up to min assist for balance support, [name] management, and LLE advancement. Pt reports feeling less fatigued than prior session and overall tolerance for functional activity appears improved. Will continue to follow and progress as able per POC.     Recommendations for follow up therapy are one component of a multi-disciplinary discharge planning process, led by the attending physician.  Recommendations may be updated based on patient status, additional functional criteria and insurance authorization.  Follow Up Recommendations  Skilled nursing-short term rehab (<3 hours/day)     Assistance Recommended at Discharge Frequent or constant Supervision/Assistance  Equipment Recommendations  None recommended by PT (TBD by next venue of care)    Recommendations for Other Services       Precautions / Restrictions Precautions Precautions: Fall Precaution Comments: L side weakness Restrictions Weight Bearing Restrictions: No     Mobility  Bed Mobility Overal bed mobility: Needs Assistance Bed Mobility: Supine to Sit     Supine to sit: Supervision Sit to supine: Supervision   General bed mobility comments: Pt was able to transition to/from EOB with supervision. Increased time and use of rails required to scoot up towards [name] at end of session.    Transfers Overall transfer level: Needs assistance Equipment used: Rolling [name] (2  wheels) Transfers: Sit to/from Stand Sit to Stand: Min guard           General transfer comment: Close guard for safety as pt powered up to full stand. VC's for hand placement on seated surface for safety.    Ambulation/Gait Ambulation/Gait assistance: Min assist Gait Distance (Feet): 85 Feet Assistive device: Rolling [name] (2 wheels) Gait Pattern/deviations: Shuffle;Decreased dorsiflexion - left;Decreased stance time - left;Decreased weight shift to left;Trunk flexed;Narrow base of support Gait velocity: Decreased Gait velocity interpretation: <1.31 ft/sec, indicative of household ambulator   General Gait Details: A few short standing rest breaks due to fatigue however pt able to progress ambulation distance this session. VC's throughout for improved posture, increased heel strike, and larger step length on the L. Assist for balance, Mckone management, and LLE advancement.   Stairs             Wheelchair Mobility    Modified Rankin (Stroke Patients Only) Modified Rankin (Stroke Patients Only) Pre-Morbid Rankin Score: Moderately severe disability Modified Rankin: Moderately severe disability     Balance Overall balance assessment: Needs assistance Sitting-balance support: No upper extremity supported;Feet supported Sitting balance-[name] Scale: Good     Standing balance support: Reliant on assistive device for balance Standing balance-[name] Scale: Poor Standing balance comment: Pt with heavy reliance on UE supported to maintain balance                            Cognition Arousal/Alertness: Awake/alert Behavior During Therapy: WFL for tasks assessed/performed Overall Cognitive Status: Impaired/Different from baseline Area of Impairment: Safety/judgement  Safety/Judgement: Decreased awareness of safety;Decreased awareness of deficits     General Comments: Pt has had multiple strokes, will benefit from further  functional assessing of her cognition.        Exercises      General Comments        Pertinent Vitals/Pain Pain Assessment: No/denies pain    Home Living                          Prior Function            PT Goals (current goals can now be found in the care plan section) Acute Rehab PT Goals Patient Stated Goal: Return to SNF but a different location PT Goal Formulation: With patient Time For Goal Achievement: 06/29/21 Potential to Achieve Goals: Good Progress towards PT goals: Progressing toward goals    Frequency    Min 3X/week      PT Plan Current plan remains appropriate    Co-evaluation              AM-PAC PT "6 Clicks" Mobility   Outcome Measure  Help needed turning from your back to your side while in a flat bed without using bedrails?: None Help needed moving from lying on your back to sitting on the side of a flat bed without using bedrails?: A Little Help needed moving to and from a bed to a chair (including a wheelchair)?: A Little Help needed standing up from a chair using your arms (e.g., wheelchair or bedside chair)?: A Little Help needed to walk in hospital room?: A Little Help needed climbing 3-5 steps with a railing? : A Lot 6 Click Score: 18    End of Session Equipment Utilized During Treatment: Gait belt Activity Tolerance: Patient tolerated treatment well Patient left: in chair;with call bell/phone within reach;with chair alarm set Nurse Communication: Mobility status PT Visit Diagnosis: Unsteadiness on feet (R26.81);Other symptoms and signs involving the nervous system (U20.254)     Time: 2706-2376 PT Time Calculation (min) (ACUTE ONLY): 15 min  Charges:  $Gait Training: 8-22 mins                     Conni Slipper, PT, DPT Acute Rehabilitation Services Pager: [phone] Office: [phone]    Marylynn Pearson 06/19/2021, 10:17 AM

## 2021-06-19 NOTE — TOC Transition Note (Signed)
Transition of Care [facility]) - CM/SW Discharge Note   Patient Details  Name: Erin Baker MRN: [id number] Date of Birth: 1941/01/05  [facility] CM/SW Contact:  Kermit Balo, RN Phone Number: 06/19/2021, 10:12 AM   Clinical Narrative:    Patient is [facility] She is transporting via [facility]. Bedside RN updated and discharge packet is at the desk.   Number for report; [phone]   Final next level of care: Skilled Nursing Facility Barriers to Discharge: No Barriers Identified   Patient Goals and CMS Choice   CMS Medicare.gov Compare Post Acute Care list provided to:: Patient Choice offered to / list presented to : Patient  Discharge Placement              Patient chooses bed at:  [facility]) Patient to be transferred to facility by: [facility] Name of family member notified: Donna--daughter Patient and family notified of of transfer: 06/19/21  Discharge Plan and Services In-house Referral: Clinical Social Work Discharge Planning Services: CM Consult Post Acute Care Choice: Skilled Nursing Facility                               Social Determinants of Health (SDOH) Interventions     Readmission Risk Interventions Readmission Risk Prevention Plan 04/13/2021  Transportation Screening Complete  PCP or Specialist Appt within 5-7 Days Complete  Home Care Screening Complete  Medication Review (RN CM) Complete

## 2021-06-19 NOTE — Progress Notes (Signed)
Discharge instructions given to [facility] for receiving facility. Iv's removed, tele discontinued. Patient belongings sent with [facility]. Report called to [facility], Charity fundraiser.  Melony Overly, RN

## 2021-07-25 ENCOUNTER — Encounter: Payer: Self-pay | Admitting: Vascular Surgery

## 2021-07-25 ENCOUNTER — Other Ambulatory Visit: Payer: Self-pay

## 2021-07-25 ENCOUNTER — Ambulatory Visit (HOSPITAL_COMMUNITY)
Admission: RE | Admit: 2021-07-25 | Discharge: 2021-07-25 | Disposition: A | Discharge status: Home / Self Care | Payer: Medicare Other | Source: Ambulatory Visit | Attending: Vascular Surgery | Admitting: Vascular Surgery

## 2021-07-25 ENCOUNTER — Ambulatory Visit (INDEPENDENT_AMBULATORY_CARE_PROVIDER_SITE_OTHER): Payer: Medicare Other | Admitting: Vascular Surgery

## 2021-07-25 VITALS — BP 140/71 | HR 66 | Temp 97.7°F | Resp 16 | Ht 65.0 in | Wt 165.0 lb

## 2021-07-25 DIAGNOSIS — I639 Cerebral infarction, unspecified: Secondary | ICD-10-CM

## 2021-07-25 DIAGNOSIS — I6521 Occlusion and stenosis of right carotid artery: Secondary | ICD-10-CM | POA: Diagnosis present

## 2021-07-25 NOTE — Progress Notes (Signed)
Patient name: Erin Baker MRN: [id number] DOB: [birth year] Sex: female  REASON FOR VISIT: Carotid surveillance after right TCAR  HPI: Erin Baker is a 80 y.o. female with history of HTN, DM, type B aortic dissection that presents for follow-up after recent right TCAR on 04/12/2021 for moderate symptomatic stenosis.  At the time of evaluation on 04/05/2021 in the hospital she had a small right frontal stroke.  She has a complex history given type B dissection and underwent thoracic stent graft repair with debranching of the left subclavian artery with a carotid subclavian bypass in [place].  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  Most recently she was hospitalized in November with another small right brain stroke.  Sounds like she had anemia and was taken off her Plavix and her aspirin was increased to 325 mg.  She has follow-up with neurology tomorrow.  She wants to establish care here for follow-up of her dissection.  She was last seen by Dr. 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  He had recommended monitoring this until it was over 6 cm.  Her only complaint today is residual left-sided leg weakness following recent stroke.  Past Medical History:  Diagnosis Date   Aortic aneurysm (HCC)    Diabetes mellitus without complication (HCC)    Hypertension    Stroke [facility])     Past Surgical History:  Procedure Laterality Date   APPENDECTOMY     CARDIAC CATHETERIZATION     COLON SURGERY     CORONARY ANGIOPLASTY     TRANSCAROTID ARTERY REVASCULARIZATION  Right 04/12/2021   Procedure: RIGHT TRANSCAROTID ARTERY REVASCULARIZATION;  Surgeon: Cephus Shelling, MD;  Location: [facility]  OR;  Service: Vascular;  Laterality: Right;   ULTRASOUND GUIDANCE FOR VASCULAR ACCESS Left 04/12/2021   Procedure: ULTRASOUND GUIDANCE FOR VASCULAR ACCESS, LEFT FEMORAL VEIN;  Surgeon: Cephus Shelling, MD;  Location: [facility] OR;  Service: Vascular;  Laterality: Left;    Family History  Problem Relation Age of Onset   Diabetes type II Granddaughter    CAD Neg Hx     SOCIAL HISTORY: Social History   Tobacco Use   Smoking status: Former    Types: Cigarettes    Passive exposure: Never   Smokeless tobacco: Never  Substance Use Topics   Alcohol use: Never    Allergies  Allergen Reactions   Oxycodone-Acetaminophen Nausea And Vomiting and Shortness Of Breath   Macrobid [Nitrofurantoin] Swelling   Morphine Other (See Comments)    Hyper, Confusion   Neurontin [Gabapentin]    Sulfa Antibiotics Swelling   Tape     Adhesive   Tramadol Other (See Comments)    hyper   Latex Rash   Levofloxacin Swelling and Rash    Current Outpatient Medications  Medication Sig Dispense Refill   amLODipine (NORVASC) 5 MG tablet Take 1 tablet (5 mg total) by mouth daily.     atorvastatin (LIPITOR) 40 MG tablet Take 40 mg by mouth daily.     calcium carbonate (TUMS - DOSED IN MG ELEMENTAL CALCIUM) 500 MG chewable tablet Chew 2 tablets by mouth every 4 (four) hours as needed for indigestion or heartburn.  cholecalciferol (VITAMIN D3) 25 MCG (1000 UNIT) tablet Take 1,000 Units by mouth daily.     diphenhydrAMINE (BENADRYL) 25 MG tablet Take 50 mg by mouth at bedtime as needed for sleep.     docusate sodium (COLACE) 100 MG capsule Take 100 mg by mouth daily.     famotidine (PEPCID) 20 MG tablet Take 20 mg by mouth daily as needed for heartburn or indigestion.     ferrous sulfate 325 (65 FE) MG EC tablet Take 325 mg by mouth daily with breakfast.     glipiZIDE (GLUCOTROL XL) 10 MG 24 hr tablet Take 10 mg by mouth daily with breakfast.     insulin detemir (LEVEMIR FLEXTOUCH) 100 UNIT/ML FlexPen Inject 27  Units into the skin at bedtime.     levothyroxine (SYNTHROID) 100 MCG tablet Take 100 mcg by mouth daily.     metoprolol tartrate (LOPRESSOR) 25 MG tablet Take 25 mg by mouth 2 (two) times daily.     nitroGLYCERIN (NITROSTAT) 0.4 MG SL tablet Place 0.4 mg under the tongue every 5 (five) minutes as needed for chest pain.     pantoprazole (PROTONIX) 40 MG tablet Take 1 tablet by mouth 2 (two) times daily.     sertraline (ZOLOFT) 50 MG tablet Take 50 mg by mouth daily.     vitamin B-12 1000 MCG tablet Take 1 tablet (1,000 mcg total) by mouth daily.     Vitamin D, Ergocalciferol, (DRISDOL) 1.25 MG (50000 UNIT) CAPS capsule Take 50,000 Units by mouth every 7 (seven) days. Every Friday     aspirin EC 325 MG EC tablet Take 1 tablet (325 mg total) by mouth daily. (Patient not taking: Reported on 07/25/2021) 30 tablet 0   furosemide (LASIX) 20 MG tablet Take 20 mg by mouth 3 (three) times a week. Take on MWF (Patient not taking: Reported on 07/25/2021)     umeclidinium bromide (INCRUSE ELLIPTA) 62.5 MCG/ACT AEPB Inhale 1 puff into the lungs daily. (Patient not taking: Reported on 07/25/2021)     No current facility-administered medications for this visit.    REVIEW OF SYSTEMS:  [X]  denotes positive finding, [ ]  denotes negative finding Cardiac  Comments:  Chest pain or chest pressure:    Shortness of breath upon exertion:    Short of breath when lying flat:    Irregular heart rhythm:        Vascular    Pain in calf, thigh, or hip brought on by ambulation:    Pain in feet at night that wakes you up from your sleep:     Blood clot in your veins:    Leg swelling:         Pulmonary    Oxygen at home:    Productive cough:     Wheezing:         Neurologic    Sudden weakness in arms or legs:     Sudden numbness in arms or legs:     Sudden onset of difficulty speaking or slurred speech:    Temporary loss of vision in one eye:     Problems with dizziness:         Gastrointestinal    Blood in  stool:     Vomited blood:         Genitourinary    Burning when urinating:     Blood in urine:        Psychiatric    Major depression:  Hematologic    Bleeding problems:    Problems with blood clotting too easily:        Skin    Rashes or ulcers:        Constitutional    Fever or chills:      PHYSICAL EXAM: Vitals:   07/25/21 0845 07/25/21 0847  BP: (!) 150/84 140/71  Pulse: 66 66  Resp: 16   Temp: 97.7 F (36.5 C)   TempSrc: Temporal   SpO2: 95%   Weight: 165 lb (74.8 kg)   Height: 5\' 5"  (1.651 m)     GENERAL: The patient is a well-nourished female, in no acute distress. The vital signs are documented above. CARDIAC: There is a regular rate and rhythm.  VASCULAR:  Palpable femoral pulses bilaterally PULMONARY: no respiratory distress. ABDOMEN: Soft and non-tender. MUSCULOSKELETAL: There are no major deformities or cyanosis. NEUROLOGIC: No focal weakness or paresthesias are detected. SKIN: There are no ulcers or rashes noted. PSYCHIATRIC: The patient has a normal affect.  DATA:   Carotid duplex today shows right carotid stent is widely patent  Assessment/Plan:  80 year old female status post right TCAR on 04/12/2021 for a moderate 60 to 79% symptomatic stenosis.  Discussed that her carotid stent looks great on duplex today and is widely patent.  She was hospitalized again in November with a small right brain stroke and neurology notes unclear if this is small vessel disease versus embolic.  She does have a risk factor with some mural thrombus in her innominate but this would be very complicated to repair given she has had a bovine arch with a history of type B dissection with thoracic stenting including debranching the left subclavian with a carotid subclavian bypass.  I would recommend continued medical management at this time.  In the past we have had her on dual antiplatelet therapy but she was recently taken off Plavix given she had profound anemia down  to a hemoglobin of 5.  She is tolerating full dose aspirin.  I will see her in 6 months with CTA chest abdomen pelvis for surveillance of her thoracic dissection status post stent grafting in [facility] with degenerated visceral aortic segment distal to the TEVAR graft.   [facility], MD [facility] [place] Office: [phone]

## 2021-07-25 NOTE — Progress Notes (Deleted)
Patient name: Erin Baker MRN: [id number] DOB: 10/28/1940 Sex: female  REASON FOR VISIT: ***  HPI: Erin Baker is a 80 y.o. female ***  She was hospitalized in November with small stroke in the right corona radiata.  Aspirin was increased from 81-3 25.  Appears neurology documented source unclear small versus large vessel disease.  This was after her TCAR.  Past Medical History:  Diagnosis Date   Aortic aneurysm (HCC)    Diabetes mellitus without complication (HCC)    Hypertension    Stroke [facility])     Past Surgical History:  Procedure Laterality Date   APPENDECTOMY     CARDIAC CATHETERIZATION     COLON SURGERY     CORONARY ANGIOPLASTY     TRANSCAROTID ARTERY REVASCULARIZATION  Right 04/12/2021   Procedure: RIGHT TRANSCAROTID ARTERY REVASCULARIZATION;  Surgeon: Cephus Shelling, MD;  Location: [facility] OR;  Service: Vascular;  Laterality: Right;   ULTRASOUND GUIDANCE FOR VASCULAR ACCESS Left 04/12/2021   Procedure: ULTRASOUND GUIDANCE FOR VASCULAR ACCESS, LEFT FEMORAL VEIN;  Surgeon: Cephus Shelling, MD;  Location: [facility] OR;  Service: Vascular;  Laterality: Left;    Family History  Problem Relation Age of Onset   Diabetes type II Granddaughter    CAD Neg Hx     SOCIAL HISTORY: Social History   Tobacco Use   Smoking status: Former    Types: Cigarettes    Passive exposure: Never   Smokeless tobacco: Never  Substance Use Topics   Alcohol use: Never    Allergies  Allergen Reactions   Oxycodone-Acetaminophen Nausea And Vomiting and Shortness Of Breath   Macrobid [Nitrofurantoin] Swelling   Morphine Other (See Comments)    Hyper, Confusion   Neurontin [Gabapentin]    Sulfa Antibiotics Swelling   Tape     Adhesive   Tramadol Other (See Comments)    hyper   Latex Rash   Levofloxacin Swelling and Rash    Current Outpatient Medications  Medication Sig Dispense Refill   amLODipine (NORVASC) 5 MG tablet Take 1 tablet (5 mg total) by mouth daily.     atorvastatin  (LIPITOR) 40 MG tablet Take 40 mg by mouth daily.     calcium carbonate (TUMS - DOSED IN MG ELEMENTAL CALCIUM) 500 MG chewable tablet Chew 2 tablets by mouth every 4 (four) hours as needed for indigestion or heartburn.     cholecalciferol (VITAMIN D3) 25 MCG (1000 UNIT) tablet Take 1,000 Units by mouth daily.     diphenhydrAMINE (BENADRYL) 25 MG tablet Take 50 mg by mouth at bedtime as needed for sleep.     docusate sodium (COLACE) 100 MG capsule Take 100 mg by mouth daily.     famotidine (PEPCID) 20 MG tablet Take 20 mg by mouth daily as needed for heartburn or indigestion.     ferrous sulfate 325 (65 FE) MG EC tablet Take 325 mg by mouth daily with breakfast.     glipiZIDE (GLUCOTROL XL) 10 MG 24 hr tablet Take 10 mg by mouth daily with breakfast.     insulin detemir (LEVEMIR FLEXTOUCH) 100 UNIT/ML FlexPen Inject 27 Units into the skin at bedtime.     levothyroxine (SYNTHROID) 100 MCG tablet Take 100 mcg by mouth daily.     metoprolol tartrate (LOPRESSOR) 25 MG tablet Take 25 mg by mouth 2 (two) times daily.     nitroGLYCERIN (NITROSTAT) 0.4 MG SL tablet Place 0.4 mg under the tongue every 5 (five) minutes as needed for chest pain.  pantoprazole (PROTONIX) 40 MG tablet Take 1 tablet by mouth 2 (two) times daily.     sertraline (ZOLOFT) 50 MG tablet Take 50 mg by mouth daily.     vitamin B-12 1000 MCG tablet Take 1 tablet (1,000 mcg total) by mouth daily.     Vitamin D, Ergocalciferol, (DRISDOL) 1.25 MG (50000 UNIT) CAPS capsule Take 50,000 Units by mouth every 7 (seven) days. Every Friday     aspirin EC 325 MG EC tablet Take 1 tablet (325 mg total) by mouth daily. (Patient not taking: Reported on 07/25/2021) 30 tablet 0   furosemide (LASIX) 20 MG tablet Take 20 mg by mouth 3 (three) times a week. Take on MWF (Patient not taking: Reported on 07/25/2021)     umeclidinium bromide (INCRUSE ELLIPTA) 62.5 MCG/ACT AEPB Inhale 1 puff into the lungs daily. (Patient not taking: Reported on 07/25/2021)      No current facility-administered medications for this visit.    REVIEW OF SYSTEMS:  [X]  denotes positive finding, [ ]  denotes negative finding Cardiac  Comments:  Chest pain or chest pressure: ***   Shortness of breath upon exertion:    Short of breath when lying flat:    Irregular heart rhythm:        Vascular    Pain in calf, thigh, or hip brought on by ambulation:    Pain in feet at night that wakes you up from your sleep:     Blood clot in your veins:    Leg swelling:         Pulmonary    Oxygen at home:    Productive cough:     Wheezing:         Neurologic    Sudden weakness in arms or legs:     Sudden numbness in arms or legs:     Sudden onset of difficulty speaking or slurred speech:    Temporary loss of vision in one eye:     Problems with dizziness:         Gastrointestinal    Blood in stool:     Vomited blood:         Genitourinary    Burning when urinating:     Blood in urine:        Psychiatric    Major depression:         Hematologic    Bleeding problems:    Problems with blood clotting too easily:        Skin    Rashes or ulcers:        Constitutional    Fever or chills:      PHYSICAL EXAM: Vitals:   07/25/21 0845 07/25/21 0847  BP: (!) 150/84 140/71  Pulse: 66 66  Resp: 16   Temp: 97.7 F (36.5 C)   TempSrc: Temporal   SpO2: 95%   Weight: 165 lb (74.8 kg)   Height: 5\' 5"  (1.651 m)     GENERAL: The patient is a well-nourished female, in no acute distress. The vital signs are documented above. CARDIAC: There is a regular rate and rhythm.  VASCULAR: *** PULMONARY: There is good air exchange bilaterally without wheezing or rales. ABDOMEN: Soft and non-tender with normal pitched bowel sounds.  MUSCULOSKELETAL: There are no major deformities or cyanosis. NEUROLOGIC: No focal weakness or paresthesias are detected. SKIN: There are no ulcers or rashes noted. PSYCHIATRIC: The patient has a normal affect.  DATA:    ***  Assessment/Plan:  ***  Cephus Shelling, MD [facility] [place] Office: [phone]

## 2021-07-26 ENCOUNTER — Encounter: Payer: Self-pay | Admitting: Adult Health

## 2021-07-26 ENCOUNTER — Ambulatory Visit: Payer: Medicare Other | Admitting: Adult Health

## 2021-07-26 VITALS — BP 122/82 | HR 66 | Ht 65.5 in | Wt 165.2 lb

## 2021-07-26 DIAGNOSIS — I639 Cerebral infarction, unspecified: Secondary | ICD-10-CM | POA: Diagnosis not present

## 2021-07-26 NOTE — Progress Notes (Signed)
[facility] [address] [place]. [place] [address] [phone]       HOSPITAL FOLLOW UP NOTE  [name] Date of Birth:  08-May-1941 Medical Record Number:  [id number]   Reason for Referral:  hospital stroke follow up    SUBJECTIVE:   CHIEF COMPLAINT:  Chief Complaint  Patient presents with   New Patient (Initial Visit)    Rm 3 with daughterButch Baker) here for hospital follow up- reports she has been doing ok. Left leg feels heavy at times.  Has been home for 2 weeks working with therapy at home.    HPI:   [name] is a 80 y.o. female with history of thoracic aortic aneurysm status post repair and left carotid subclavian artery bypass, diabetes, hypertension, OSA, non-STEMI and CAD status post stent, history of stroke status post loop recorder but no A. fib who presented on 04/04/2021 for acute onset aphasia lasted approximately 1 hour.  Personally reviewed hospitalization pertinent progress notes, lab work and imaging.  Evaluated by Dr. Erlinda Baker for right frontal cortical small stroke with unclear etiology possibly right ICA stenosis. Prior loop recorder placement without evidence of A. Fib -attempted interrogation and noted to be depleted of battery.  CTA head/neck bulky calcified plaque bilateral carotid bifurcations with 50% on right and 40% on left.  Carotid Doppler right ICA 60 to 79% stenosis.  EF 60 to 65%. S/p TCAR by Dr. Carlis Baker without complication.  LDL 79.  A1c 7.7.  On DAPT PTA and atorvastatin 80 mg daily - recommended continuation at discharge.  Therapies recommended HH with 24-hour supervision.   Returns on 06/14/2021 with new onset of LLE weakness.  Personally reviewed hospitalization pertinent progress notes, lab work and imaging.  Evaluated by Dr. Erlinda Baker for right MCA/ACA small infarct embolic pattern secondary to unclear source, small vs large vessel disease.  CTA head/neck patent right ICA stent and findings of chronic ischemic microangiopathy.  EF  60%.  Recommended 30-day cardiac event monitor outpatient to rule out A. Fib although prior ILR for 3+ years without evidence of A. fib.  LDL 51.  A1c 6.2.  On aspirin 81 mg PTA increase to aspirin 325 mg daily.  DAPT not recommended given severe anemia requiring PRBC.  HTN stable although home dose metoprolol held due to bradycardia and placed on amlodipine 5 mg daily.  Continued atorvastatin 40 mg daily.  PT/OT recommended SNF   Today, 07/26/2021, patient being seen for initial hospital follow-up accompanied by her daughter, Erin Baker.  Discharged from SNF rehab back home 2.5 weeks ago. She has been struggling with activity intolerance and poor endurance.  Continued LLE weakness and heaviness sensation with some improvement. Family tried to appeal SNF discharge as they did not feel she was ready to return home but insurance denied appeal. Currently working with [facility] therapies. Lives alone - able to maintain ADLs independently but frustrated due to limited function of left hand due to chronic numbness from prior strokes and now walking difficulty. She does drive without difficulty.  She questions use of cane - apparently is supposed to be using RW but noncompliant due to difficulty taking in and out of her car and difficult to maneuver around her house.  Denies new stroke/TIA symptoms.  Compliant on aspirin 325 mg daily and atorvastatin 80 mg daily -denies side effects Concerned she has not been restarted back on Plavix and fears increased stroke risk Blood pressure today 122/82 F/u with [facility] Dr. 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  CTA head & neck Negative CTA for large vessel occlusion. 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 Left common to subclavian artery graft in place with perfusion of the left subclavian artery distally. Widely patent flow through the graft. 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 Numerous chronic supratentorial and infratentorial infarcts. 2D Echo  EF 60-65%, LVH, Grade I diastolic dysfunction, No thrombus, wall motion abnormality or shunt found.   LDL 79 HgbA1c 7.7  Per hospitalization 06/14/2021 Code Stroke   CT head No acute abnormality.  Small vessel disease. Atrophy.  ASPECTS 10.     CTA head & neck - Patent right ICA stent. Multiple old infarcts and findings of chronic ischemic microangiopathy. Stent-treated thoracic aortic aneurysm. MRI  Brain: Small focus of acute ischemia within the right corona radiata. Multiple old infarcts and findings of chronic ischemic microangiopathy 2D Echo EF 60%, aortic dilatation, normal LA size, no IA shunt Recommend 30 day cardiac event monitoring to rule out afib LDL 51 HgbA1c 6.2      ROS:   14 system review of systems performed and negative with exception of those listed in HPI  PMH:  Past Medical History:  Diagnosis Date   Aortic aneurysm (HCC)    Diabetes mellitus without complication (HCC)    Hypertension    Stroke (HCC)     PSH:  Past Surgical History:  Procedure Laterality Date   APPENDECTOMY     CARDIAC CATHETERIZATION     COLON SURGERY     CORONARY ANGIOPLASTY     TRANSCAROTID ARTERY REVASCULARIZATION  Right 04/12/2021   Procedure: RIGHT TRANSCAROTID ARTERY REVASCULARIZATION;  Surgeon:  Erin Shelling, MD;  Location: [facility] OR;  Service: Vascular;  Laterality: Right;   ULTRASOUND GUIDANCE FOR VASCULAR ACCESS Left 04/12/2021   Procedure: ULTRASOUND GUIDANCE FOR VASCULAR ACCESS, LEFT FEMORAL VEIN;  Surgeon: Erin Shelling, MD;  Location: [facility] OR;  Service: Vascular;  Laterality: Left;    Social History:  Social History   Socioeconomic History   Marital status: Single    Spouse name: Not on file   Number of children: Not on file   Years of education: Not on file   Highest education level: Not on file  Occupational History   Not on file  Tobacco Use   Smoking status: Former    Types: Cigarettes    Passive exposure: Never   Smokeless tobacco: Never  Substance and Sexual Activity   Alcohol use: Never   Drug use: Never   Sexual activity: Not on file  Other Topics Concern   Not on file  Social History Narrative   Not on file   Social Determinants of Health   Financial Resource Strain: Not on file  Food Insecurity: Not on file  Transportation Needs: Not on file  Physical Activity: Not on file  Stress: Not on file  Social Connections: Not on file  Intimate Partner Violence: Not on file    Family History:  Family History  Problem Relation Age of Onset   Diabetes type II Granddaughter    CAD Neg Hx     Medications:   Current Outpatient Medications on File Prior to Visit  Medication Sig Dispense Refill   aspirin EC 325 MG EC tablet Take 1 tablet (325 mg total) by mouth daily. 30 tablet 0   atorvastatin (LIPITOR) 40 MG tablet Take 40 mg by mouth daily.     cholecalciferol (VITAMIN D3) 25 MCG (1000 UNIT) tablet Take 1,000 Units by mouth daily.     diphenhydrAMINE (BENADRYL) 25 MG tablet Take 50 mg by mouth at bedtime as needed for sleep.     ferrous sulfate 325 (65 FE) MG EC tablet Take 325 mg by mouth daily with breakfast.     furosemide (LASIX) 20 MG tablet Take 20 mg by mouth 3 (three) times a week. Take on MWF     glipiZIDE (GLUCOTROL XL) 10 MG 24  hr tablet Take 10 mg by mouth daily with breakfast.     insulin detemir (LEVEMIR FLEXTOUCH) 100 UNIT/ML FlexPen Inject 27 Units into the skin at bedtime.     levothyroxine (SYNTHROID) 100 MCG tablet Take 100 mcg by mouth daily.     metoprolol tartrate (LOPRESSOR) 25 MG tablet Take 25 mg by mouth 2 (two) times daily.     nitroGLYCERIN (NITROSTAT) 0.4 MG SL tablet Place 0.4 mg under the tongue every 5 (five) minutes as needed for chest pain.     pantoprazole (PROTONIX) 40 MG tablet Take 1 tablet by mouth 2 (two) times daily.     sertraline (ZOLOFT) 50 MG tablet Take 50 mg by mouth daily.     umeclidinium bromide (INCRUSE ELLIPTA) 62.5 MCG/ACT AEPB Inhale 1 puff into the lungs daily.     Vitamin D, Ergocalciferol, (DRISDOL) 1.25 MG (50000 UNIT) CAPS capsule Take 50,000 Units by mouth every 7 (seven) days. Every Friday     No current facility-administered medications on file prior to visit.    Allergies:   Allergies  Allergen Reactions   Oxycodone-Acetaminophen Nausea And Vomiting and Shortness Of Breath   Macrobid [Nitrofurantoin] Swelling   Morphine Other (See Comments)    Hyper, Confusion   Neurontin [Gabapentin]    Sulfa Antibiotics Swelling   Tape     Adhesive   Tramadol Other (See Comments)    hyper   Latex Rash   Levofloxacin Swelling and Rash      OBJECTIVE:  Physical Exam  Vitals:   07/26/21 1508  BP: 122/82  Pulse: 66  SpO2: 95%  Weight: 165 lb 4 oz (75 kg)  Height: 5' 5.5" (1.664 m)   Body mass index is 27.08 kg/m. No results found.  Post stroke PHQ 2/9 Depression screen PHQ 2/9 07/26/2021  Decreased Interest 0  Down, Depressed, Hopeless 1  PHQ - 2 Score 1     General: well developed, well nourished, very pleasant elderly Caucasian female, seated, mildly anxious but otherwise no evident distress Head: head normocephalic and atraumatic.   Neck: supple with no carotid or supraclavicular bruits Cardiovascular: regular rate and rhythm, no  murmurs Musculoskeletal: no deformity Skin:  no rash/petichiae Vascular:  Normal pulses all extremities   Neurologic Exam Mental Status: Awake and fully alert. mild chronic slurred speech especially when speaking too quickly.  No evidence of aphasia.  Oriented to place and time.  Recent and remote memory intact. Attention span, concentration and fund of knowledge appropriate. Mood and affect appropriate.  Cranial Nerves: Fundoscopic exam reveals sharp disc margins. Pupils equal, briskly reactive to light. Extraocular movements full without nystagmus. Visual fields full to confrontation. Hearing intact. Facial sensation intact. Face, tongue, palate moves normally and symmetrically.  Motor: Normal bulk and tone. Normal strength in all tested extremity muscles except mild left hip flexor and ADF weakness Sensory.: intact to touch , pinprick , position and vibratory sensation except subjective numbness left hand Coordination: Rapid alternating movements normal in all extremities. Finger-to-nose performed accurately bilaterally and heel-to-shin performed accurately RLE - difficulty performing LLE d/t HF weakness. Gait and Station: Arises from chair without difficulty. Stance is normal. Gait demonstrates decreased stride length and step height with circumduction gait LLE and mild unsteadiness without use of assistive device.  Tandem walking heel toe not attempted Reflexes: 1+ and symmetric. Toes downgoing.     NIHSS  1 (chronic speech impairment) Modified Rankin  2-3      ASSESSMENT: Erin Baker is a 80 y.o. year old female with complicated medical history with recent right frontal cortical small stroke on 04/04/2021 likely in setting of right ICA stenosis s/p TCAR and R MCA/ACA small infarct embolic on 0000000, unclear source possibly small vessel disease vs embolic. 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      PLAN:  Recurrent multiple strokes:  continue HH PT for residual LLE weakness. Discussed use of AD for fall prevention - she plans on further discussing with PT. Use of AD may help with activity intolerance.  Discussed typical recovery time and hopeful further recovery  continue aspirin 325 mg daily  and atorvastatin 40 mg daily for secondary stroke prevention.  Discussed use of Plavix which is not needed from a stroke standpoint and with hx of anemia requiring transfusions, seems risk of restarting would likely outweigh any benefit but did advise her to discuss further with her cardiologist as she has been on DAPT for hx of CAD s/p stent Prior ILR 3+ years negative for A. Fib - Dr. Erlinda Baker rec'd 30 day cardiac monitor - will reach out to see if this is still warranted Discussed secondary stroke prevention measures and importance of close PCP follow up for aggressive stroke risk factor management. I have gone over the pathophysiology of stroke, warning signs and symptoms, risk factors and their management in some detail with instructions to go to the closest emergency room for symptoms of concern. S/p R TCAR: Followed by Dr. Carlis Baker HTN: BP goal <130/90.  Stable on current regimen per PCP HLD: LDL goal <70. Recent LDL 51 on atorvastatin 40 mg daily per PCP.  DMII: A1c goal<7.0. Recent A1c 6.2.  Managed by PCP    Follow up in 4 months or call earlier if needed   CC:  [facility] provider: Dr. Leonie Man PCP: Kathreen Devoid, PA-C    I spent 80 minutes of face-to-face and non-face-to-face time with patient and daughter.  This included previsit chart review including review of recent multiple hospitalizations,  lab review, study review, electronic health record documentation, patient and daughter education and prolonged  discussion regarding recent stroke including etiology, secondary stroke prevention measures and importance of managing stroke risk factors, residual deficits and typical recovery time and answered all other questions to patient and daughters satisfaction  Frann Rider, AGNP-BC  [facility] [address] [place], [place] [address]  Phone [phone] Fax [phone] Note: This document was prepared with digital dictation and possible smart phrase technology. Any transcriptional errors that result from this process are unintentional.

## 2021-07-26 NOTE — Patient Instructions (Signed)
Continue working with home health therapies for hopeful ongoing improvement  Continue aspirin 325 mg daily  and atorvastatin 80 mg daily for secondary stroke prevention  Continue to follow up with PCP regarding blood pressure, cholesterol and diabetes management  Maintain strict control of hypertension with blood pressure goal below 130/90, diabetes with hemoglobin A1c goal below 7.0 % and cholesterol with LDL cholesterol (bad cholesterol) goal below 70 mg/dL.   Signs of a Stroke? Follow the BEFAST method:  Balance Watch for a sudden loss of balance, trouble with coordination or vertigo Eyes Is there a sudden loss of vision in one or both eyes? Or double vision?  Face: Ask the person to smile. Does one side of the face droop or is it numb?  Arms: Ask the person to raise both arms. Does one arm drift downward? Is there weakness or numbness of a leg? Speech: Ask the person to repeat a simple phrase. Does the speech sound slurred/strange? Is the person confused ? Time: If you observe any of these signs, call 911.     Followup in the future with me in 4 months or call earlier if needed       Thank you for coming to see [place] at [facility]. I hope we have been able to provide you high quality care today.  You may receive a patient satisfaction survey over the next few weeks. We would appreciate your feedback and comments so that we may continue to improve ourselves and the health of our patients.    Stroke Prevention Some medical conditions and lifestyle choices can lead to a higher risk for a stroke. You can help to prevent a stroke by eating healthy foods and exercising. It also helps to not smoke and to manage any health problems you may have. How can this condition affect me? A stroke is an emergency. It should be treated right away. A stroke can lead to brain damage or threaten your life. There is a better chance of surviving and getting better after a stroke if you  get medical help right away. What can increase my risk? The following medical conditions may increase your risk of a stroke: Diseases of the heart and blood vessels (cardiovascular disease). High blood pressure (hypertension). Diabetes. High cholesterol. Sickle cell disease. Problems with blood clotting. Being very overweight. Sleeping problems (obstructivesleep apnea). Other risk factors include: Being older than age 64. A history of blood clots, stroke, or mini-stroke (TIA). Race, ethnic background, or a family history of stroke. Smoking or using tobacco products. Taking birth control pills, especially if you smoke. Heavy alcohol and drug use. Not being active. What actions can I take to prevent this? Manage your health conditions High cholesterol. Eat a healthy diet. If this is not enough to manage your cholesterol, you may need to take medicines. Take medicines as told by your doctor. High blood pressure. Try to keep your blood pressure below 130/80. If your blood pressure cannot be managed through a healthy diet and regular exercise, you may need to take medicines. Take medicines as told by your doctor. Ask your doctor if you should check your blood pressure at home. Have your blood pressure checked every year. Diabetes. Eat a healthy diet and get regular exercise. If your blood sugar (glucose) cannot be managed through diet and exercise, you may need to take medicines. Take medicines as told by your doctor. Talk to your doctor about getting checked for sleeping problems. Signs of a problem can include:  Snoring a lot. Feeling very tired. Make sure that you manage any other conditions you have. Nutrition  Follow instructions from your doctor about what to eat or drink. You may be told to: Eat and drink fewer calories each day. Limit how much salt (sodium) you use to 1,500 milligrams (mg) each day. Use only healthy fats for cooking, such as olive oil, canola oil, and  sunflower oil. Eat healthy foods. To do this: Choose foods that are high in fiber. These include whole grains, and fresh fruits and vegetables. Eat at least 5 servings of fruits and vegetables a day. Try to fill one-half of your plate with fruits and vegetables at each meal. Choose low-fat (lean) proteins. These include low-fat cuts of meat, chicken without skin, fish, tofu, beans, and nuts. Eat low-fat dairy products. Avoid foods that: Are high in salt. Have saturated fat. Have trans fat. Have cholesterol. Are processed or pre-made. Count how many carbohydrates you eat and drink each day. Lifestyle If you drink alcohol: Limit how much you have to: 0-1 drink a day for women who are not pregnant. 0-2 drinks a day for men. Know how much alcohol is in your drink. In the [place] one drink equals one 12 oz bottle of beer ( ), one 5 oz glass of wine ( ), or one 1 oz glass of hard liquor (63mL). Do not smoke or use any products that have nicotine or tobacco. If you need help quitting, ask your doctor. Avoid secondhand smoke. Do not use drugs. Activity  Try to stay at a healthy weight. Get at least 30 minutes of exercise on most days, such as: Fast walking. Biking. Swimming. Medicines Take over-the-counter and prescription medicines only as told by your doctor. Avoid taking birth control pills. Talk to your doctor about the risks of taking birth control pills if: You are over 42 years old. You smoke. You get very bad headaches. You have had a blood clot. Where to find more information American Stroke Association: www.strokeassociation.org Get help right away if: You or a loved one has any signs of a stroke. "BE FAST" is an easy way to remember the warning signs: B - Balance. Dizziness, sudden trouble walking, or loss of balance. E - Eyes. Trouble seeing or a change in how you see. F - Face. Sudden weakness or loss of feeling of the face. The face or eyelid may droop on one  side. A - Arms. Weakness or loss of feeling in an arm. This happens all of a sudden and most often on one side of the body. S - Speech. Sudden trouble speaking, slurred speech, or trouble understanding what people say. T - Time. Time to call emergency services. Write down what time symptoms started. You or a loved one has other signs of a stroke, such as: A sudden, very bad headache with no known cause. Feeling like you may vomit (nausea). Vomiting. A seizure. These symptoms may be an emergency. Get help right away. Call your local emergency services (911 in the [place] Do not wait to see if the symptoms will go away. Do not drive yourself to the hospital. Summary You can help to prevent a stroke by eating healthy, exercising, and not smoking. It also helps to manage any health problems you have. Do not smoke or use any products that contain nicotine or tobacco. Get help right away if you or a loved one has any signs of a stroke. This information is not intended to replace advice given to you  by your health care provider. Make sure you discuss any questions you have with your health care provider. Document Revised: 02/15/2020 Document Reviewed: 02/15/2020 Elsevier Patient Education  2022 [employer].

## 2021-08-01 ENCOUNTER — Other Ambulatory Visit: Payer: Self-pay | Admitting: Adult Health

## 2021-08-01 DIAGNOSIS — I639 Cerebral infarction, unspecified: Secondary | ICD-10-CM

## 2021-08-01 NOTE — Progress Notes (Signed)
Per Dr. Warren Danes recommendations, request 30 day cardiac event monitor in setting of recurrent stroke. Thank you

## 2021-12-04 ENCOUNTER — Ambulatory Visit: Payer: Medicare Other | Admitting: Adult Health

## 2021-12-15 ENCOUNTER — Other Ambulatory Visit: Payer: Self-pay

## 2021-12-15 DIAGNOSIS — I639 Cerebral infarction, unspecified: Secondary | ICD-10-CM

## 2021-12-28 ENCOUNTER — Ambulatory Visit (HOSPITAL_COMMUNITY): Payer: Medicare Other

## 2022-01-01 ENCOUNTER — Ambulatory Visit (HOSPITAL_COMMUNITY)
Admission: RE | Admit: 2022-01-01 | Discharge: 2022-01-01 | Disposition: A | Discharge status: Home / Self Care | Payer: Medicare Other | Source: Ambulatory Visit | Attending: Vascular Surgery | Admitting: Vascular Surgery

## 2022-01-01 DIAGNOSIS — I639 Cerebral infarction, unspecified: Secondary | ICD-10-CM | POA: Insufficient documentation

## 2022-01-01 LAB — POCT I-STAT CREATININE: Creatinine, Ser: 1.1 mg/dL — ABNORMAL HIGH (ref 0.44–1.00)

## 2022-01-01 IMAGING — CT CTA CHEST-ABD-PELV  W/ AND/OR W/O CM W/ DISSECTION AND GATING
3 of 10 series · 13 of 46 positions shown, 14 images · non-contrast
Comparison: CTA of the chest [DATE]; MRI of the abdomen
[DATE]

CLINICAL DATA: Acute ischemic stroke, known abdominal aortic
aneurysm

EXAM:
CT ANGIOGRAPHY CHEST, ABDOMEN AND PELVIS
TECHNIQUE: Non-contrast CT of the chest was initially obtained.

[Series 5: do not send · axial · 0.79mm/px · z∈[+774,+859]mm · 2 of 1597 slices shown]
[im 107/1597  soft-tissue]
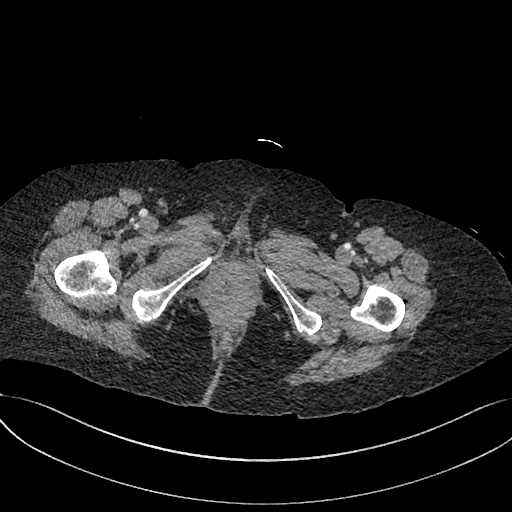
[im 320/1597  soft-tissue]
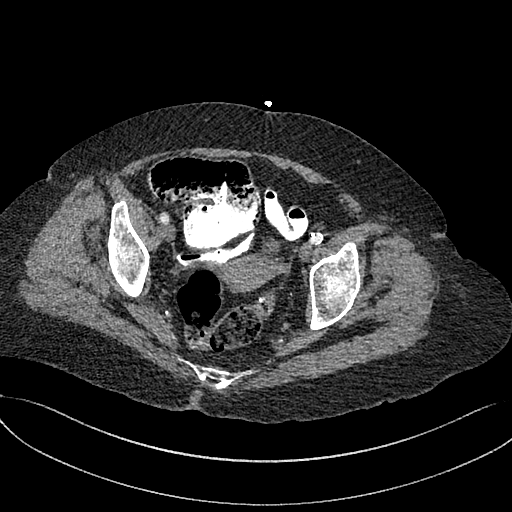

[Series 6: ax thin · axial · 0.59mm/px · z∈[+802,+1298]mm · 8 of 1065 slices shown, 9 images]
[im 119/1065  soft-tissue]
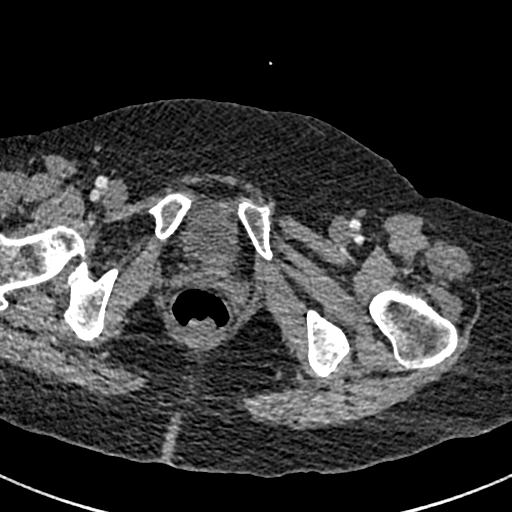
[im 119/1065  bone]
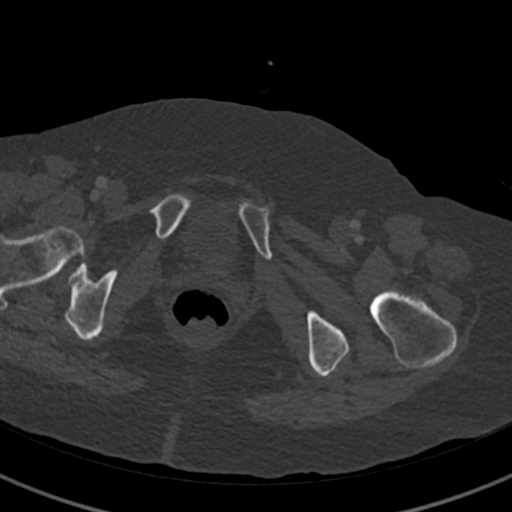
[im 237/1065  soft-tissue]
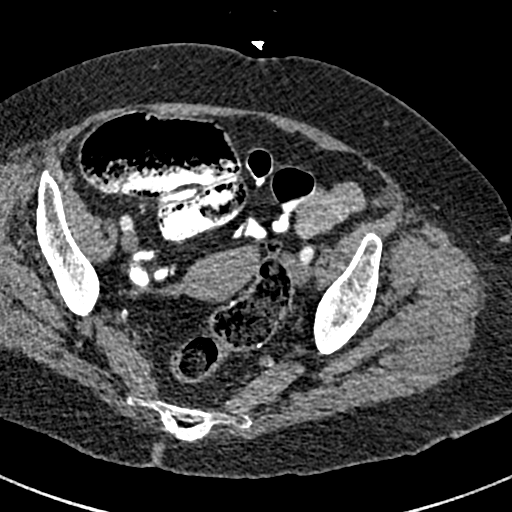
[im 355/1065  soft-tissue]
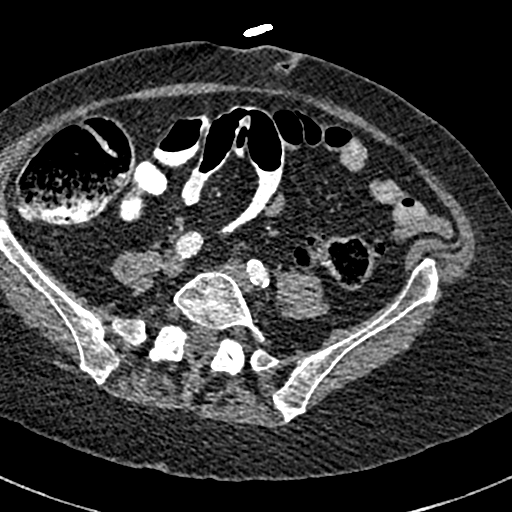
[im 473/1065  soft-tissue]
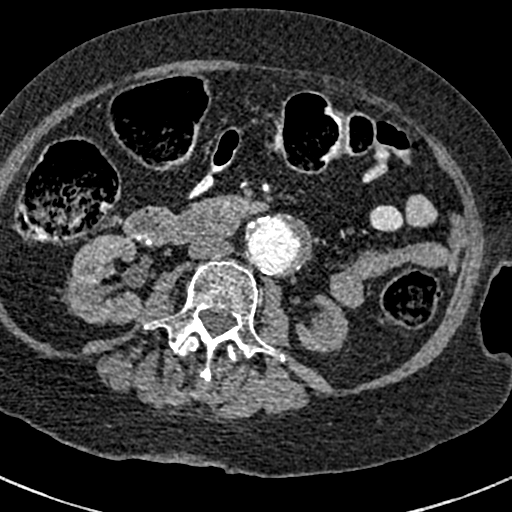
[im 592/1065  soft-tissue]
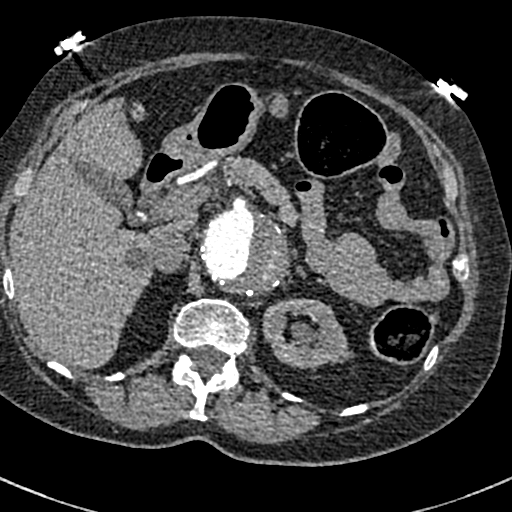
[im 710/1065  soft-tissue]
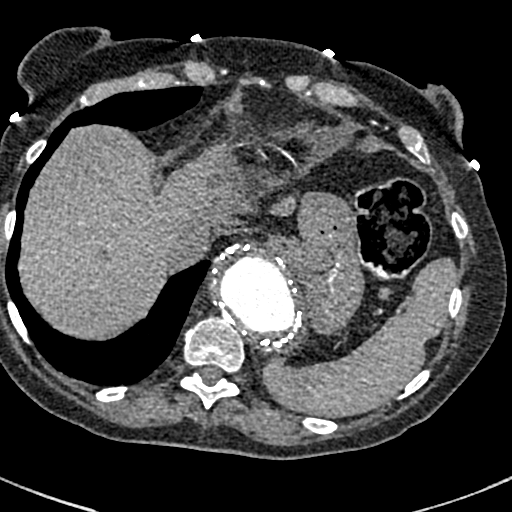
[im 828/1065  soft-tissue]
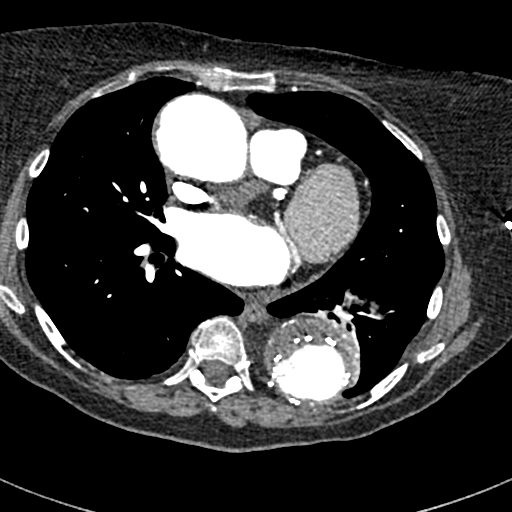
[im 946/1065  soft-tissue]
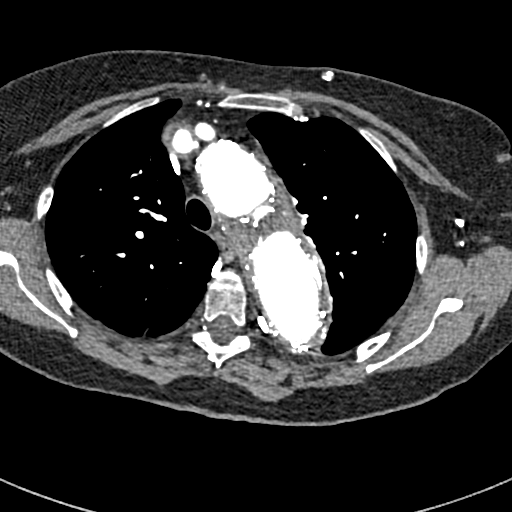

[Series 11: cor 68 % · coronal · 0.83mm/px · 3 of 140 slices shown]
[im 35/140  soft-tissue]
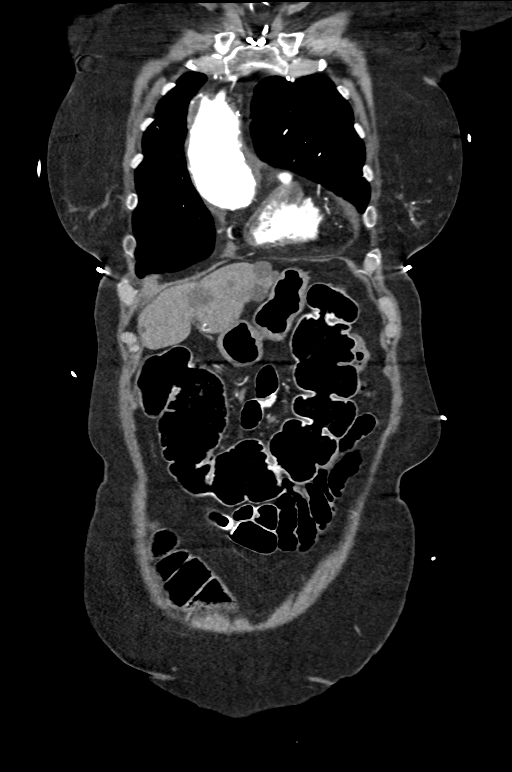
[im 70/140  soft-tissue]
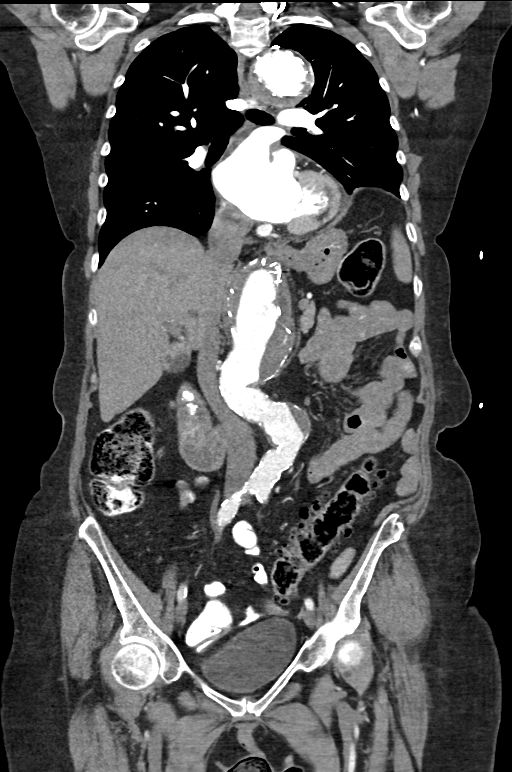
[im 105/140  soft-tissue]
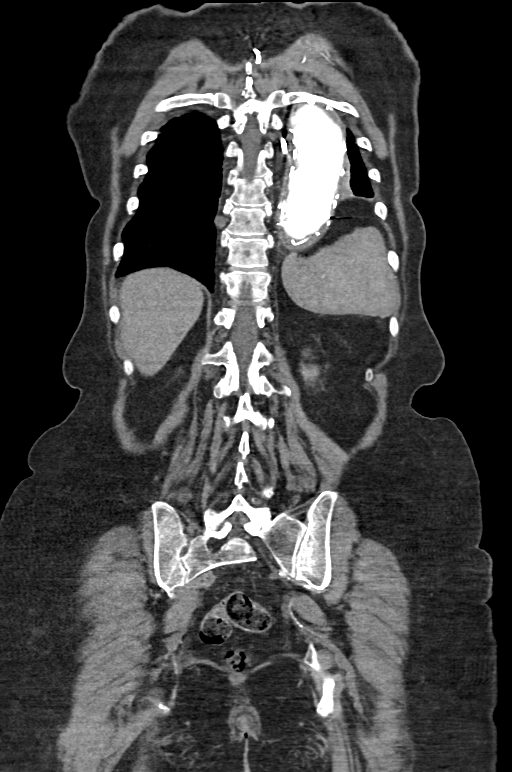

[13 of 46 positions shown; findings below may reference images not displayed]

Multidetector CT imaging through the chest, abdomen and pelvis was
performed using the standard protocol during bolus administration of
intravenous contrast. Multiplanar reconstructed images and MIPs were
obtained and reviewed to evaluate the vascular anatomy.

RADIATION DOSE REDUCTION: This exam was performed according to the
departmental dose-optimization program which includes automated
exposure control, adjustment of the mA and/or kV according to
patient size and/or use of iterative reconstruction technique.

CONTRAST:  100mL OMNIPAQUE IOHEXOL 350 MG/ML SOLN
FINDINGS: CTA CHEST FINDINGS

Cardiovascular: The aortic root is normal in caliber. No effacement
of the GIORGI junction. Aneurysmal dilation of the tubular
portion of the ascending thoracic aorta is present measuring up to 5
cm. This is unchanged compared to [DATE]. Surgical changes of
stent graft repair of the transverse and descending thoracic aorta.
The stent graft starts just beyond the origin of the left common
carotid artery occluding the origin of the left subclavian artery.
There is a widely patent carotid subclavian bypass graft without
evidence of complication. Aneurysmal dilation of the distal most
descending thoracic aorta measures approximately 5.5 cm, this is
also unchanged compared to prior.

Mediastinum/Nodes: No enlarged mediastinal, hilar, or axillary lymph
nodes. Thyroid gland, trachea, and esophagus demonstrate no
significant findings.

Lungs/Pleura: Lungs are clear. No pleural effusion or pneumothorax.
Stable mild areas of linear scarring.

Musculoskeletal: No acute osseous abnormality.

Review of the MIP images confirms the above findings.

CTA ABDOMEN AND PELVIS FINDINGS

VASCULAR

Aorta: Aneurysmal dissection extends into the visceral abdominal
aorta. Maximal diameter at the transition is 5.5 cm, unchanged. The
aorta is highly tortuous. Aneurysmal dilation of the infrarenal
abdominal aorta measures up to 3.8 cm.

Celiac: Patent without evidence of aneurysm, dissection, vasculitis
or significant stenosis.

SMA: Patent without evidence of aneurysm, dissection, vasculitis or
significant stenosis.

Renals: Solitary renal arteries bilaterally. Mild calcified plaque
at the origins. No aneurysm or dissection.

IMA: Patent without evidence of aneurysm, dissection, vasculitis or
significant stenosis.

Inflow: Patent without evidence of aneurysm, dissection, vasculitis
or significant stenosis. Patent left common femoral stent extending
into the proximal external iliac artery.

Veins: No focal venous abnormality.

Review of the MIP images confirms the above findings.

NON-VASCULAR

Hepatobiliary: Normal hepatic contour and morphology. Stable
appearance of multifocal circumscribed water attenuation cysts
throughout the liver consistent with hepatic cystic disease.
Gallbladder is unremarkable. No intra or extrahepatic biliary ductal
dilatation.

Pancreas: Unremarkable. No pancreatic ductal dilatation or
surrounding inflammatory changes.

Spleen: Normal in size without focal abnormality.

Adrenals/Urinary Tract: Normal adrenal glands. No hydronephrosis,
nephrolithiasis or enhancing renal mass. Ureters and bladder are
unremarkable.

Stomach/Bowel: Surgical changes of prior sigmoid colectomy with
patent colo colonic anastomosis. Residual diverticula in the
descending and remaining sigmoid colon. No evidence of active
diverticulitis.

Lymphatic: No suspicious lymphadenopathy.

Reproductive: Uterus and bilateral adnexa are unremarkable.

Other: Small fat containing umbilical hernia.  No ascites.

Musculoskeletal: No acute or significant osseous findings.

Review of the MIP images confirms the above findings.
IMPRESSION: 1. Surgical changes of long segment thoracic endovascular aortic
repair of aneurysmal dissection covering the origin of the left
subclavian artery with a widely patent left carotid subclavian
bypass graft. The graft extends into the supra visceral abdominal
aorta.
2. Aneurysmal dilation of the distal descending/supra visceral
abdominal aorta with a maximal diameter of 5.5 cm, unchanged
compared to prior. Recommend referral to a vascular specialist. This
recommendation follows ACR consensus guidelines: White Paper of the
ACR Incidental Findings Committee II on Vascular Findings. [HOSPITAL] [7Q]; [DATE].
3. Aneurysmal dilation of the tubular portion of the ascending
thoracic aorta at 5 cm, unchanged. Ascending thoracic aortic
aneurysm. Recommend semi-annual imaging followup by CTA or MRA and
referral to cardiothoracic surgery if not already obtained. This
recommendation follows [7Q]
ACCF/AHA/AATS/ACR/ASA/SCA/GIORGI/GIORGI/GIORGI/GIORGI Guidelines for the
Diagnosis and Management of Patients With Thoracic Aortic Disease.
Circulation. [7Q]; 121: E266-e369. Aortic aneurysm NOS ([7Q]-[7Q])
4. Aneurysmal dilation of the mid infrarenal abdominal aorta remains
stable at 3.8 cm.
5. The abdominal aorta is highly tortuous.
6. Small fat containing umbilical hernia.
7. Colonic diverticulosis with evidence of prior sigmoid colectomy.
8. Additional ancillary findings as above without significant
interval change.

## 2022-01-01 MED ORDER — IOHEXOL 350 MG/ML SOLN
100.0000 mL | Freq: Once | INTRAVENOUS | Status: AC | PRN
  Administered 2022-01-01: 100 mL via INTRAVENOUS

## 2022-01-16 ENCOUNTER — Ambulatory Visit: Payer: Medicare Other | Admitting: Vascular Surgery

## 2022-01-16 ENCOUNTER — Encounter: Payer: Self-pay | Admitting: Vascular Surgery

## 2022-01-16 DIAGNOSIS — Z95828 Presence of other vascular implants and grafts: Secondary | ICD-10-CM | POA: Diagnosis not present

## 2022-01-16 NOTE — Progress Notes (Signed)
Patient name: Erin Baker MRN: [id number] DOB: 1940/08/06 Sex: female  REASON FOR VISIT: 6 month follow-up for surveillance of her thoracic dissection status post stent grafting in [place]  HPI: Erin Baker is a 81 y.o. female with history of HTN, DM, type B aortic dissection that presents for follow-up.  Most recently she underwent a right TCAR on 04/12/2021 for moderate symptomatic stenosis here at [facility] by myself.  At the time of evaluation on 04/05/2021 in the hospital she had a small right frontal stroke.  She has a complex history given type B dissection with aneurysmal degeneration and underwent thoracic stent graft repair with debranching of the left subclavian artery with a carotid subclavian bypass in [place].  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  She wanted to establish care here for follow-up of her dissection.  She was last seen by Dr. Cherly Hensen at [facility] on 03/10/2021 with aneurysmal degeneration of the perivisceral segment distal to her thoracic stent graft.  His note indicates that she has a TEVAR to the celiac axis and then a maximum diameter of 5.4 cm near the end of the TEVAR endograft.  He had recommended monitoring this until it was over 6 cm.  No new complaints.  Overall feels well.  No recent neurologic events since TCAR.  Past Medical History:  Diagnosis Date   Aortic aneurysm (HCC)    Diabetes mellitus without complication (HCC)    Hypertension    Stroke [facility])     Past Surgical History:  Procedure Laterality Date   APPENDECTOMY     CARDIAC CATHETERIZATION     COLON SURGERY     CORONARY ANGIOPLASTY     TRANSCAROTID ARTERY REVASCULARIZATION  Right 04/12/2021   Procedure: RIGHT TRANSCAROTID ARTERY REVASCULARIZATION;  Surgeon: Cephus Shelling, MD;  Location: [facility] OR;  Service: Vascular;  Laterality: Right;   ULTRASOUND GUIDANCE FOR VASCULAR ACCESS Left 04/12/2021   Procedure:  ULTRASOUND GUIDANCE FOR VASCULAR ACCESS, LEFT FEMORAL VEIN;  Surgeon: Cephus Shelling, MD;  Location: [facility] OR;  Service: Vascular;  Laterality: Left;    Family History  Problem Relation Age of Onset   Diabetes type II Granddaughter    CAD Neg Hx     SOCIAL HISTORY: Social History   Tobacco Use   Smoking status: Former    Types: Cigarettes    Passive exposure: Never   Smokeless tobacco: Never  Substance Use Topics   Alcohol use: Never    Allergies  Allergen Reactions   Oxycodone-Acetaminophen Nausea And Vomiting and Shortness Of Breath   Macrobid [Nitrofurantoin] Swelling   Morphine Other (See Comments)    Hyper, Confusion   Neurontin [Gabapentin]    Sulfa Antibiotics Swelling   Tape     Adhesive   Tramadol Other (See Comments)    hyper   Latex Rash   Levofloxacin Swelling and Rash    Current Outpatient Medications  Medication Sig Dispense Refill   aspirin EC 325 MG EC tablet Take 1 tablet (325 mg total) by mouth daily. 30 tablet 0   atorvastatin (LIPITOR) 40 MG tablet Take 40 mg by mouth daily.     cholecalciferol (VITAMIN D3) 25 MCG (1000 UNIT) tablet Take 1,000 Units by mouth daily.     diphenhydrAMINE (BENADRYL) 25 MG tablet Take 50 mg by mouth at bedtime as needed for sleep.     ferrous sulfate 325 (65 FE) MG EC tablet Take 325  mg by mouth daily with breakfast.     furosemide (LASIX) 20 MG tablet Take 20 mg by mouth 3 (three) times a week. Take on MWF     glipiZIDE (GLUCOTROL XL) 10 MG 24 hr tablet Take 10 mg by mouth daily with breakfast.     insulin detemir (LEVEMIR FLEXTOUCH) 100 UNIT/ML FlexPen Inject 27 Units into the skin at bedtime.     levothyroxine (SYNTHROID) 100 MCG tablet Take 100 mcg by mouth daily.     metoprolol tartrate (LOPRESSOR) 25 MG tablet Take 25 mg by mouth 2 (two) times daily.     nitroGLYCERIN (NITROSTAT) 0.4 MG SL tablet Place 0.4 mg under the tongue every 5 (five) minutes as needed for chest pain.     pantoprazole (PROTONIX) 40 MG  tablet Take 1 tablet by mouth 2 (two) times daily.     umeclidinium bromide (INCRUSE ELLIPTA) 62.5 MCG/ACT AEPB Inhale 1 puff into the lungs daily.     Vitamin D, Ergocalciferol, (DRISDOL) 1.25 MG (50000 UNIT) CAPS capsule Take 50,000 Units by mouth every 7 (seven) days. Every Friday     sertraline (ZOLOFT) 50 MG tablet Take 50 mg by mouth daily. (Patient not taking: Reported on 01/16/2022)     No current facility-administered medications for this visit.    REVIEW OF SYSTEMS:  [X]  denotes positive finding, [ ]  denotes negative finding Cardiac  Comments:  Chest pain or chest pressure:    Shortness of breath upon exertion:    Short of breath when lying flat:    Irregular heart rhythm:        Vascular    Pain in calf, thigh, or hip brought on by ambulation:    Pain in feet at night that wakes you up from your sleep:     Blood clot in your veins:    Leg swelling:         Pulmonary    Oxygen at home:    Productive cough:     Wheezing:         Neurologic    Sudden weakness in arms or legs:     Sudden numbness in arms or legs:     Sudden onset of difficulty speaking or slurred speech:    Temporary loss of vision in one eye:     Problems with dizziness:         Gastrointestinal    Blood in stool:     Vomited blood:         Genitourinary    Burning when urinating:     Blood in urine:        Psychiatric    Major depression:         Hematologic    Bleeding problems:    Problems with blood clotting too easily:        Skin    Rashes or ulcers:        Constitutional    Fever or chills:      PHYSICAL EXAM: Vitals:   01/16/22 1004 01/16/22 1007  BP: (!) 146/81 (!) 141/75  Pulse: 60 60  Resp: 14   Temp: (!) 97.3 F (36.3 C)   TempSrc: Temporal   SpO2: 98%   Weight: 163 lb (73.9 kg)   Height: 5\' 5"  (1.651 m)     GENERAL: The patient is a well-nourished female, in no acute distress. The vital signs are documented above. CARDIAC: There is a regular rate and rhythm.   VASCULAR:  Palpable femoral pulses  bilaterally PULMONARY: no respiratory distress. ABDOMEN: Soft and non-tender. MUSCULOSKELETAL: There are no major deformities or cyanosis. NEUROLOGIC: No focal weakness or paresthesias are detected.  CN II-XII grossly intact. SKIN: There are no ulcers or rashes noted. PSYCHIATRIC: The patient has a normal affect.  DATA:   CTA chest abdomen pelvis 01/01/22 shows a 5 cm ascending aortic aneurysm and also stable position of the stent graft in the arch and descending thoracic aorta with coverage of the left subclavian and a patent left carotid subclavian bypass.  She has known aneurysmal degeneration distal to the stent graft in the perivisceral segment of her abdominal aorta measuring about 5.5 cm and unchanged over past 6 months.  No endoleak.    Assessment/Plan:  81 year old female status post right TCAR on 04/12/2021 for a moderate 60 to 79% symptomatic stenosis by myself here at [facility].  She also presents for surveillance of her thoracic stent graft placed in [place] for a type B dissection with aneurysmal degeneration with debranching of the left subclavian artery with a left carotid subclavian bypass.  Stent graft all appears to be in good position.  She has known aneurysmal degeneration distal to the stent graft in the perivisceral segment of her aorta starting around the celiac artery.  Maximal diameter remains around 5.5 cm and unchanged over past 6 months.  I agree with previous recommendations from [facility] that I would wait until this is over 6 cm to consider repair as this would be quite complex.  I will have her see me again in 6 months with a CTA chest abdomen pelvis for surveillance of her thoracic stent and continue to follow aneurysmal degeneration distal to her stent graft as well as carotid duplex for surveillance of her TCAR carotid stent on the right.    I will also send her to [facility] for evaluation of her ascending aortic aneurysm by CT surgery.   States she has never seen a CT Careers adviser in the past.   Cephus Shelling, MD [facility] Office: [phone]

## 2022-01-19 ENCOUNTER — Other Ambulatory Visit: Payer: Self-pay

## 2022-01-19 DIAGNOSIS — Z95828 Presence of other vascular implants and grafts: Secondary | ICD-10-CM

## 2022-02-16 ENCOUNTER — Institutional Professional Consult (permissible substitution): Payer: Medicare Other | Admitting: Thoracic Surgery (Cardiothoracic Vascular Surgery)

## 2022-02-16 VITALS — BP 133/80 | HR 64 | Resp 20 | Ht 65.0 in | Wt 145.0 lb

## 2022-02-16 DIAGNOSIS — I7121 Aneurysm of the ascending aorta, without rupture: Secondary | ICD-10-CM

## 2022-02-16 NOTE — Progress Notes (Signed)
[address]       [place] [address]             [phone]        Kalika Smay [facility] Medical Record #[id number] Date of Birth: 01-Feb-1941  Referring: Cephus Shelling, MD Primary Care: Clemencia Course, [place] Primary Cardiologist:None  Chief Complaint:    Chief Complaint  Patient presents with   Thoracic Aortic Aneurysm    Surgical consult, CTA C/A/P 01/01/22    History of Present Illness:     82 year old female referred for surgical evaluation centimeter ascending aortic aneurysm.  She has a history of type B dissection with endovascular stenting in [place].  She subsequently went right TCAR by Dr. Lottie Dawson in September 2022.    Past Medical History:  Diagnosis Date   Aortic aneurysm (HCC)    Diabetes mellitus without complication (HCC)    Hypertension    Stroke [facility])     Past Surgical History:  Procedure Laterality Date   APPENDECTOMY     CARDIAC CATHETERIZATION     COLON SURGERY     CORONARY ANGIOPLASTY     TRANSCAROTID ARTERY REVASCULARIZATION  Right 04/12/2021   Procedure: RIGHT TRANSCAROTID ARTERY REVASCULARIZATION;  Surgeon: Cephus Shelling, MD;  Location: [facility] OR;  Service: Vascular;  Laterality: Right;   ULTRASOUND GUIDANCE FOR VASCULAR ACCESS Left 04/12/2021   Procedure: ULTRASOUND GUIDANCE FOR VASCULAR ACCESS, LEFT FEMORAL VEIN;  Surgeon: Cephus Shelling, MD;  Location: [facility] OR;  Service: Vascular;  Laterality: Left;    Social History: Support: She lives with herself  Social History   Tobacco Use  Smoking Status Former   Types: Cigarettes   Passive exposure: Never  Smokeless Tobacco Never    Social History   Substance and Sexual Activity  Alcohol Use Never     Allergies  Allergen Reactions   Oxycodone-Acetaminophen Nausea And Vomiting and Shortness Of Breath   Macrobid [Nitrofurantoin] Swelling   Morphine Other (See Comments)    Hyper, Confusion   Neurontin [Gabapentin]    Sulfa Antibiotics  Swelling   Tape     Adhesive   Tramadol Other (See Comments)    hyper   Latex Rash   Levofloxacin Swelling and Rash     Current Outpatient Medications  Medication Sig Dispense Refill   aspirin EC 325 MG EC tablet Take 1 tablet (325 mg total) by mouth daily. 30 tablet 0   atorvastatin (LIPITOR) 40 MG tablet Take 40 mg by mouth daily.     cholecalciferol (VITAMIN D3) 25 MCG (1000 UNIT) tablet Take 1,000 Units by mouth daily.     diphenhydrAMINE (BENADRYL) 25 MG tablet Take 50 mg by mouth at bedtime as needed for sleep.     ferrous sulfate 325 (65 FE) MG EC tablet Take 325 mg by mouth daily with breakfast.     furosemide (LASIX) 20 MG tablet Take 20 mg by mouth 3 (three) times a week. Take on MWF     glipiZIDE (GLUCOTROL XL) 10 MG 24 hr tablet Take 10 mg by mouth daily with breakfast.     insulin detemir (LEVEMIR FLEXTOUCH) 100 UNIT/ML FlexPen Inject 27 Units into the skin at bedtime.     levothyroxine (SYNTHROID) 100 MCG tablet Take 100 mcg by mouth daily.     metoprolol tartrate (LOPRESSOR) 25 MG tablet Take 25 mg by mouth 2 (two) times daily.     nitroGLYCERIN (NITROSTAT) 0.4 MG SL tablet Place 0.4 mg under  the tongue every 5 (five) minutes as needed for chest pain.     pantoprazole (PROTONIX) 40 MG tablet Take 1 tablet by mouth 2 (two) times daily.     sertraline (ZOLOFT) 50 MG tablet Take 50 mg by mouth daily.     umeclidinium bromide (INCRUSE ELLIPTA) 62.5 MCG/ACT AEPB Inhale 1 puff into the lungs daily.     Vitamin D, Ergocalciferol, (DRISDOL) 1.25 MG (50000 UNIT) CAPS capsule Take 50,000 Units by mouth every 7 (seven) days. Every Friday     No current facility-administered medications for this visit.    (Not in a hospital admission)   Family History  Problem Relation Age of Onset   Diabetes type II Granddaughter    CAD Neg Hx      Review of Systems:   Review of Systems  Constitutional: Negative.   Respiratory:  Negative for shortness of breath.   Cardiovascular:   Negative for chest pain.  Musculoskeletal:  Positive for back pain, falls, joint pain and neck pain.      Physical Exam: BP 133/80   Pulse 64   Resp 20   Ht 5\' 5"  (1.651 m)   Wt 145 lb (65.8 kg)   SpO2 99% Comment: RA  BMI 24.13 kg/m  Physical Exam Constitutional:      General: She is not in acute distress.    Appearance: She is not ill-appearing.  Cardiovascular:     Rate and Rhythm: Normal rate.  Pulmonary:     Effort: Pulmonary effort is normal.  Musculoskeletal:     Cervical back: Normal range of motion.  Neurological:     General: No focal deficit present.     Mental Status: She is alert and oriented to person, place, and time.        I have independently reviewed the above radiologic studies and discussed with the patient   Recent Lab Findings: Lab Results  Component Value Date   WBC 9.3 06/18/2021   HGB 8.9 (L) 06/18/2021   HCT 30.0 (L) 06/18/2021   PLT 228 06/18/2021   GLUCOSE 175 (H) 06/19/2021   CHOL 97 06/15/2021   TRIG 83 06/15/2021   HDL 29 (L) 06/15/2021   LDLCALC 51 06/15/2021   ALT 9 06/15/2021   AST 14 (L) 06/15/2021   NA 135 06/19/2021   K 4.5 06/19/2021   CL 104 06/19/2021   CREATININE 1.10 (H) 01/01/2022   BUN 25 (H) 06/19/2021   CO2 24 06/19/2021   INR 1.3 (H) 06/14/2021   HGBA1C 6.2 (H) 06/15/2021      Assessment / Plan:   81year-old female with a 5 cm ascending aortic aneurysm.  Echocardiogram shows a tricuspid valve without evidence of regurgitation.  We discussed the natural history and and risk factors for growth of ascending aortic aneurysms.  We covered the importance of smoking cessation, tight blood pressure control, refraining from lifting heavy objects, and avoiding fluoroquinolones.  The patient is aware of signs and symptoms of aortic dissection and when to present to the emergency department.  We also discussed the possibility of no interventions given her age and functional status.  She is leaning to this refusing any  symptoms of heart surgery in the future.  We will continue surveillance and a repeat CT was ordered for  6 months.  If she elects to not have any surgery in the future then I do not see to her CT chest after that point.    I  spent 40 minutes counseling  the patient face to face.   Corliss Skains 02/16/2022 1:17 PM

## 2022-07-06 ENCOUNTER — Other Ambulatory Visit: Payer: Self-pay | Admitting: Thoracic Surgery (Cardiothoracic Vascular Surgery)

## 2022-08-10 ENCOUNTER — Telehealth: Payer: Medicare Other | Admitting: Thoracic Surgery (Cardiothoracic Vascular Surgery)

## 2022-09-08 ENCOUNTER — Telehealth: Payer: Self-pay

## 2022-09-08 NOTE — Telephone Encounter (Signed)
I spoke with pt's daughter to attempt to schedule Erin Baker's 6 month f/u visit with Dr. Carlis Abbott. Butch Penny stated her mom has started seeing a provider in [place] that will be following her Vascular care and her appt is no longer needed.

## 2023-03-16 ENCOUNTER — Encounter (HOSPITAL_BASED_OUTPATIENT_CLINIC_OR_DEPARTMENT_OTHER): Payer: Self-pay

## 2023-03-16 ENCOUNTER — Emergency Department (HOSPITAL_BASED_OUTPATIENT_CLINIC_OR_DEPARTMENT_OTHER): Payer: Medicare Other

## 2023-03-16 ENCOUNTER — Other Ambulatory Visit: Payer: Self-pay

## 2023-03-16 ENCOUNTER — Emergency Department (HOSPITAL_BASED_OUTPATIENT_CLINIC_OR_DEPARTMENT_OTHER)
Admission: EM | Admit: 2023-03-16 | Discharge: 2023-03-16 | Disposition: A | Discharge status: Home / Self Care | Payer: Medicare Other | Attending: Emergency Medicine | Admitting: Emergency Medicine

## 2023-03-16 DIAGNOSIS — Z8679 Personal history of other diseases of the circulatory system: Secondary | ICD-10-CM

## 2023-03-16 DIAGNOSIS — I159 Secondary hypertension, unspecified: Secondary | ICD-10-CM

## 2023-03-16 DIAGNOSIS — Z79899 Other long term (current) drug therapy: Secondary | ICD-10-CM | POA: Diagnosis not present

## 2023-03-16 DIAGNOSIS — M545 Low back pain, unspecified: Secondary | ICD-10-CM | POA: Diagnosis present

## 2023-03-16 DIAGNOSIS — I10 Essential (primary) hypertension: Secondary | ICD-10-CM | POA: Insufficient documentation

## 2023-03-16 DIAGNOSIS — Z91040 Latex allergy status: Secondary | ICD-10-CM | POA: Diagnosis not present

## 2023-03-16 DIAGNOSIS — I2510 Atherosclerotic heart disease of native coronary artery without angina pectoris: Secondary | ICD-10-CM | POA: Diagnosis not present

## 2023-03-16 DIAGNOSIS — Z7982 Long term (current) use of aspirin: Secondary | ICD-10-CM | POA: Insufficient documentation

## 2023-03-16 DIAGNOSIS — M549 Dorsalgia, unspecified: Secondary | ICD-10-CM | POA: Diagnosis not present

## 2023-03-16 LAB — BASIC METABOLIC PANEL
Anion gap: 11 (ref 5–15)
BUN: 16 mg/dL (ref 8–23)
CO2: 23 mmol/L (ref 22–32)
Calcium: 8.7 mg/dL — ABNORMAL LOW (ref 8.9–10.3)
Chloride: 105 mmol/L (ref 98–111)
Creatinine, Ser: 0.87 mg/dL (ref 0.44–1.00)
GFR, Estimated: >60 mL/min (ref >60)
Glucose, Bld: 173 mg/dL — ABNORMAL HIGH (ref 70–99)
Potassium: 3.8 mmol/L (ref 3.5–5.1)
Sodium: 139 mmol/L (ref 135–145)

## 2023-03-16 LAB — CBC
HCT: 36.5 % (ref 36.0–46.0)
Hemoglobin: 11.2 g/dL — ABNORMAL LOW (ref 12.0–15.0)
MCH: 26.4 pg (ref 26.0–34.0)
MCHC: 30.7 g/dL (ref 30.0–36.0)
MCV: 85.9 fL (ref 80.0–100.0)
Platelets: 204 10*3/uL (ref 150–400)
RBC: 4.25 MIL/uL (ref 3.87–5.11)
RDW: 17.1 % — ABNORMAL HIGH (ref 11.5–15.5)
WBC: 8.6 10*3/uL (ref 4.0–10.5)
nRBC: 0 % (ref 0.0–0.2)

## 2023-03-16 LAB — TROPONIN I (HIGH SENSITIVITY)
Troponin I (High Sensitivity): 6 ng/L (ref <18)
Troponin I (High Sensitivity): 6 ng/L (ref <18)

## 2023-03-16 MED ORDER — ACETAMINOPHEN 500 MG PO TABS
1000.0000 mg | ORAL_TABLET | ORAL | Status: AC
  Administered 2023-03-16: 1000 mg via ORAL
  Filled 2023-03-16: qty 2

## 2023-03-16 MED ORDER — CYCLOBENZAPRINE HCL 5 MG PO TABS
5.0000 mg | ORAL_TABLET | Freq: Once | ORAL | Status: AC
  Administered 2023-03-16: 5 mg via ORAL
  Filled 2023-03-16: qty 1

## 2023-03-16 MED ORDER — NYSTATIN 100000 UNIT/GM EX POWD
Freq: Once | CUTANEOUS | Status: AC
  Filled 2023-03-16: qty 15

## 2023-03-16 MED ORDER — LIDOCAINE 5 % EX PTCH: 1.0000 | MEDICATED_PATCH | CUTANEOUS | Status: DC

## 2023-03-16 MED ORDER — LIDOCAINE 5 % EX PTCH
2.0000 | MEDICATED_PATCH | CUTANEOUS | Status: DC
  Administered 2023-03-16: 2 via TRANSDERMAL
  Filled 2023-03-16: qty 2

## 2023-03-16 MED ORDER — CYCLOBENZAPRINE HCL 10 MG PO TABS: 5.0000 mg | ORAL_TABLET | Freq: Two times a day (BID) | ORAL | 0 refills | Status: AC | PRN

## 2023-03-16 MED ORDER — AMLODIPINE BESYLATE 5 MG PO TABS: 5.0000 mg | ORAL_TABLET | Freq: Every day | ORAL | 2 refills | Status: AC

## 2023-03-16 MED ORDER — IOHEXOL 350 MG/ML SOLN
100.0000 mL | Freq: Once | INTRAVENOUS | Status: AC | PRN
  Administered 2023-03-16: 100 mL via INTRAVENOUS

## 2023-03-16 NOTE — ED Provider Notes (Signed)
[place] [facility] Provider Note   CSN: [id number] Arrival date & time: 03/16/23  1109     History  Chief Complaint  Patient presents with   Back Pain   Chest Pain    Erin Baker is a 82 y.o. female.  82 year old female with a history of aortic aneurysm status post repair, CAD status post PCI, hypertension, hyperlipidemia, and stroke who presents to the emergency department back pain.  Patient reports that yesterday she started experiencing severe back pain radiates to both of her shoulders.  Says she initially thought it was just muscle pain but has persisted and does not have any injury so came into the emergency department for evaluation.  Also reports intermittent chest pain over the past few days.  Does not believe it is exertional.  No diaphoresis or vomiting.  No shortness of breath.       Home Medications Prior to Admission medications   Medication Sig Start Date End Date Taking? Authorizing Provider  amLODipine (NORVASC) 5 MG tablet Take 1 tablet (5 mg total) by mouth daily. 03/16/23  Yes Rondel Baton, MD  cyclobenzaprine (FLEXERIL) 10 MG tablet Take 0.5 tablets (5 mg total) by mouth 2 (two) times daily as needed for muscle spasms. 03/16/23  Yes Rondel Baton, MD  aspirin EC 325 MG EC tablet Take 1 tablet (325 mg total) by mouth daily. 06/19/21   Lanae Boast, MD  atorvastatin (LIPITOR) 40 MG tablet Take 40 mg by mouth daily.    [provider]  cholecalciferol (VITAMIN D3) 25 MCG (1000 UNIT) tablet Take 1,000 Units by mouth daily.    [provider]  diphenhydrAMINE (BENADRYL) 25 MG tablet Take 50 mg by mouth at bedtime as needed for sleep.    [provider]  ferrous sulfate 325 (65 FE) MG EC tablet Take 325 mg by mouth daily with breakfast.    [provider]  furosemide (LASIX) 20 MG tablet Take 20 mg by mouth 3 (three) times a week. Take on MWF 11/24/19   [provider]   glipiZIDE (GLUCOTROL XL) 10 MG 24 hr tablet Take 10 mg by mouth daily with breakfast.    [provider]  insulin detemir (LEVEMIR FLEXTOUCH) 100 UNIT/ML FlexPen Inject 27 Units into the skin at bedtime. 11/03/20   [provider]  levothyroxine (SYNTHROID) 100 MCG tablet Take 100 mcg by mouth daily. 12/22/19 02/23/22  [provider]  metoprolol tartrate (LOPRESSOR) 25 MG tablet Take 25 mg by mouth 2 (two) times daily. 01/02/21   [provider]  nitroGLYCERIN (NITROSTAT) 0.4 MG SL tablet Place 0.4 mg under the tongue every 5 (five) minutes as needed for chest pain.    [provider]  pantoprazole (PROTONIX) 40 MG tablet Take 1 tablet by mouth 2 (two) times daily. 02/08/20   [provider]  sertraline (ZOLOFT) 50 MG tablet Take 50 mg by mouth daily. 08/02/20   [provider]  umeclidinium bromide (INCRUSE ELLIPTA) 62.5 MCG/ACT AEPB Inhale 1 puff into the lungs daily.    [provider]  Vitamin D, Ergocalciferol, (DRISDOL) 1.25 MG (50000 UNIT) CAPS capsule Take 50,000 Units by mouth every 7 (seven) days. Every Friday    [provider]      Allergies    Oxycodone-acetaminophen, Macrobid [nitrofurantoin], Morphine, Neurontin [gabapentin], Sulfa antibiotics, Tape, Tramadol, Latex, and Levofloxacin    Review of Systems   Review of Systems  Physical Exam Updated Vital Signs BP Marland Kitchen)  160/80 (BP Location: Left Arm)   Pulse (!) 57   Temp (!) 97.5 F (36.4 C) (Oral)   Resp (!) 25   Ht 5\' 5"  (1.651 m)   Wt 72.1 kg   SpO2 93%   BMI 26.46 kg/m  Physical Exam Vitals and nursing note reviewed.  Constitutional:      General: She is not in acute distress.    Appearance: She is well-developed.  HENT:     Head: Normocephalic and atraumatic.     Right Ear: External ear normal.     Left Ear: External ear normal.     Nose: Nose normal.  Eyes:     Extraocular Movements: Extraocular movements intact.      Conjunctiva/sclera: Conjunctivae normal.     Pupils: Pupils are equal, round, and reactive to light.  Neck:     Comments: Tenderness to palpation of bilateral trapezius Cardiovascular:     Rate and Rhythm: Normal rate and regular rhythm.     Heart sounds: No murmur heard.    Comments: Radial pulses 2+ bilaterally Pulmonary:     Effort: Pulmonary effort is normal. No respiratory distress.     Breath sounds: Normal breath sounds.  Abdominal:     General: Abdomen is flat. There is no distension.     Palpations: Abdomen is soft. There is no mass.     Tenderness: There is no abdominal tenderness. There is no guarding.  Musculoskeletal:     Cervical back: Normal range of motion and neck supple.     Right lower leg: No edema.     Left lower leg: No edema.  Skin:    General: Skin is warm and dry.  Neurological:     Mental Status: She is alert and oriented to person, place, and time. Mental status is at baseline.  Psychiatric:        Mood and Affect: Mood normal.     ED Results / Procedures / Treatments   Labs (all labs ordered are listed, but only abnormal results are displayed) Labs Reviewed  BASIC METABOLIC PANEL - Abnormal; Notable for the following components:      Result Value   Glucose, Bld 173 (*)    Calcium 8.7 (*)    All other components within normal limits  CBC - Abnormal; Notable for the following components:   Hemoglobin 11.2 (*)    RDW 17.1 (*)    All other components within normal limits  TROPONIN I (HIGH SENSITIVITY)  TROPONIN I (HIGH SENSITIVITY)    EKG EKG Interpretation Date/Time:  Saturday March 16 2023 11:39:21 EDT Ventricular Rate:  67 PR Interval:  169 QRS Duration:  136 QT Interval:  454 QTC Calculation: 480 R Axis:   -34  Text Interpretation: Sinus rhythm Right bundle branch block Baseline wander in lead(s) I III aVL Confirmed by Vonita Moss [phone]) on 03/16/2023 12:07:16 PM  Radiology CT ANGIO CHEST/ABD/PEL FOR DISSECTION W &/OR WO  CONTRAST  Result Date: 03/16/2023 CLINICAL DATA:  History of endovascular aortic aneurysm repair. Now with sharp back pain radiating into the neck and chest. EXAM: CT ANGIOGRAPHY CHEST, ABDOMEN AND PELVIS TECHNIQUE: Non-contrast CT of the chest was initially obtained. Multidetector CT imaging through the chest, abdomen and pelvis was performed using the standard protocol during bolus administration of intravenous contrast. Multiplanar reconstructed images and MIPs were obtained and reviewed to evaluate the vascular anatomy. RADIATION DOSE REDUCTION: This exam was performed according to the departmental dose-optimization program which includes automated exposure control, adjustment  of the mA and/or kV according to patient size and/or use of iterative reconstruction technique. CONTRAST:  OMNIPAQUE IOHEXOL 350 MG/ML SOLN COMPARISON:  01/01/2022 FINDINGS: CTA CHEST FINDINGS Cardiovascular: Pre contrast imaging shows no hyperdense crescent in the wall of the ascending thoracic aorta. 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 Thoracic aortic stent graft repair again noted. Position of stent graft appears stable with proximal and just distal to the origin of the left common carotid artery and distal and positioned just proximal to the celiac artery origin. Left common carotid to subclavian bypass graft is patent. Distal descending thoracic aorta measures 5.6 cm diameter just proximal to the diaphragmatic hiatus, compared to 5.5 cm previously. No large central pulmonary embolus. Mediastinum/Nodes: No mediastinal lymphadenopathy. There is no hilar lymphadenopathy. The esophagus has normal imaging features. There is no axillary lymphadenopathy. Lungs/Pleura: Subsegmental atelectasis noted medial right upper and lower lobes. Similar chronic atelectasis medial left lower lobe adjacent to the aneurysm. No new  focal airspace consolidation. No suspicious pulmonary nodule or mass. No pleural effusion. Musculoskeletal: No worrisome lytic or sclerotic osseous abnormality. Review of the MIP images confirms the above findings. 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 Infrarenal aneurysmal dilatation reported previously at 3.8 cm is 3.7 cm today. Celiac: Patent without evidence of aneurysm, dissection, vasculitis or significant stenosis. SMA: Patent without evidence of aneurysm, dissection, vasculitis or significant stenosis. Renals: Both renal arteries are patent without evidence of aneurysm, dissection, vasculitis, fibromuscular dysplasia or significant stenosis. IMA: Patent without evidence of aneurysm, dissection, vasculitis or significant stenosis. Inflow: Patent without evidence of aneurysm, dissection, vasculitis or significant stenosis. Left external iliac stent is patent. Veins: No obvious venous abnormality within the limitations of this arterial phase study. Review of the MIP images confirms the above findings. NON-VASCULAR Hepatobiliary: Multiple hepatic cysts again noted. No followup imaging is recommended. There is no evidence for gallstones, gallbladder wall thickening, or pericholecystic fluid. No intrahepatic or extrahepatic biliary dilation. Pancreas: No focal mass lesion. No dilatation of the main duct. No intraparenchymal cyst. No peripancreatic edema. Spleen: No splenomegaly. No suspicious focal mass lesion. Adrenals/Urinary Tract: No adrenal nodule or mass. Kidneys unremarkable. No evidence for hydroureter. The urinary bladder appears normal for the degree of distention. Stomach/Bowel: Stomach is unremarkable. No gastric wall thickening. No evidence of outlet obstruction. Duodenum is normally positioned as is the ligament of Treitz. No small bowel wall thickening. No small bowel dilatation. The terminal ileum is normal. The appendix is not  well visualized, but there is no edema or inflammation in the region of the cecal tip to suggest appendicitis. No gross colonic mass. No colonic wall thickening. Diverticular changes are noted in the left colon without evidence of diverticulitis. Rectosigmoid anastomosis evident. Lymphatic: There is no gastrohepatic or hepatoduodenal ligament lymphadenopathy. No retroperitoneal or mesenteric lymphadenopathy. No pelvic sidewall lymphadenopathy. Reproductive: Unremarkable. Other: No intraperitoneal free fluid. Musculoskeletal: Small umbilical hernia contains only fat. No worrisome lytic or sclerotic osseous abnormality. Review of the MIP images confirms the above findings. IMPRESSION: 1. Stable exam. No new or acute findings. 2. Stable appearance of the thoracic aortic stent graft repair. 3. 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 Recommend semi-annual imaging followup by CTA or MRA and referral to cardiothoracic surgery if not already obtained. This recommendation follows 2010 ACCF/AHA/AATS/ACR/ASA/SCA/SCAI/SIR/STS/SVM Guidelines  for the Diagnosis and Management of Patients With Thoracic Aortic Disease. Circulation. 2010; 121: [id number]. Aortic aneurysm NOS (ICD10-I71.9) 4. Patent left common carotid artery to subclavian artery bypass graft. 5. No acute findings in the chest, abdomen, or pelvis. 6. Left colonic diverticulosis without diverticulitis. 7. Small umbilical hernia contains only fat. Electronically Signed   By: Kennith Center M.D.   On: 03/16/2023 13:26   DG Chest Port 1 View  Result Date: 03/16/2023 CLINICAL DATA:  [id number] Chest pain [id number] EXAM: PORTABLE CHEST 1 VIEW COMPARISON:  05/23/2021 chest radiograph. FINDINGS: Surgical clips again noted in the low left neck. Stable partially visualized long aortic stent graft extending from the proximal arch into the abdominal aorta. Stable cardiomediastinal silhouette with  borderline mild cardiomegaly and prominent ascending aortic contour. No pneumothorax. Chronic mild blunting of the left costophrenic angle favoring pleural-parenchymal scarring. No definite pleural effusions. Chronic mild elevation of the left hemidiaphragm. No pulmonary edema. No acute consolidative airspace disease. Loop recorder overlies the medial lower chest. IMPRESSION: No active pulmonary disease. Chronic mild elevation of the left hemidiaphragm and chronic mild blunting of the left costophrenic angle favoring pleural-parenchymal scarring. Borderline mild cardiomegaly without pulmonary edema. Chronic prominence of the ascending thoracic aortic contour. Electronically Signed   By: Delbert Phenix M.D.   On: 03/16/2023 12:24    Procedures Procedures    Medications Ordered in ED Medications  lidocaine (LIDODERM) 5 % 2 patch (2 patches Transdermal Patch Applied 03/16/23 1234)  acetaminophen (TYLENOL) tablet 1,000 mg (1,000 mg Oral Given 03/16/23 1233)  nystatin (MYCOSTATIN/NYSTOP) topical powder ( Topical Given 03/16/23 1235)  iohexol (OMNIPAQUE) 350 MG/ML injection 100 mL (100 mLs Intravenous Contrast Given 03/16/23 1241)  cyclobenzaprine (FLEXERIL) tablet 5 mg (5 mg Oral Given 03/16/23 1528)    ED Course/ Medical Decision Making/ A&P                                 Medical Decision Making Amount and/or Complexity of Data Reviewed Labs: ordered. Radiology: ordered.  Risk OTC drugs. Prescription drug management.   Erin Baker is a 82 y.o. female with comorbidities that complicate the patient evaluation including aortic aneurysm status post repair, CAD status post PCI, hypertension, hyperlipidemia, and stroke who presents to the emergency department upper back pain.   Initial Ddx:  Thoracic aortic aneurysm, thoracic aortic dissection, MI, pneumonia, pathologic fracture, spinal cord compression  MDM/Course:  Patient presents to the emergency department complaining of atraumatic upper  back pain.  EKG and troponins without evidence of acute MI.  She had a CTA which did not show evidence of dissection or worsening thoracic aortic aneurysm.  No pathologic fractures noted.  She is neuro intact so no concern for possible spinal cord compression.  Upon re-evaluation was feeling much better after the Tylenol and lidocaine patch.  Did have a small rash in her left groin that appears to be consistent with candidiasis.  Was given some nystatin powder for that.  Was also significantly hypertensive so was started and amlodipine.  Will have her follow-up with her primary doctor in several days regarding her symptoms.  This patient presents to the ED for concern of complaints listed in HPI, this involves an extensive number of treatment options, and is a complaint that carries with it a high risk of complications and morbidity. Disposition including potential need for admission considered.   Dispo: DC Home. Return precautions discussed including, but not limited to, those  listed in the AVS. Allowed pt time to ask questions which were answered fully prior to dc.  Records reviewed Outpatient Clinic Notes The following labs were independently interpreted: Chemistry and show no acute abnormality I independently reviewed the following imaging with scope of interpretation limited to determining acute life threatening conditions related to emergency care: CT Chest and CT Abdomen/Pelvis and agree with the radiologist interpretation with the following exceptions: none I personally reviewed and interpreted cardiac monitoring: normal sinus rhythm  I personally reviewed and interpreted the pt's EKG: see above for interpretation  I have reviewed the patients home medications and made adjustments as needed Social Determinants of health:  Elderly         Final Clinical Impression(s) / ED Diagnoses Final diagnoses:  Upper back pain  Secondary hypertension  History of thoracic aortic aneurysm repair     Rx / DC Orders ED Discharge Orders          Ordered    amLODipine (NORVASC) 5 MG tablet  Daily        03/16/23 1529    cyclobenzaprine (FLEXERIL) 10 MG tablet  2 times daily PRN        03/16/23 1529              Rondel Baton, MD 03/16/23 [phone]

## 2023-03-16 NOTE — ED Triage Notes (Signed)
The patient started having sharp back pain yesterday that is now moving into her neck and chest. She stated she was seen at urgent care and they sent her here for eval due to hx of aneurisms and NSTEMI.

## 2023-03-16 NOTE — Discharge Instructions (Signed)
You were seen for your back pain in the emergency department.   At home, please take the muscle relaxer (Flexeril) for your pain.  Your blood pressure was also noted to be high so please start taking the amlodipine for your blood pressure.    Check your MyChart online for the results of any tests that had not resulted by the time you left the emergency department.   Follow-up with your primary doctor in 2-3 days regarding your visit.    Return immediately to the emergency department if you experience any of the following: Worsening pain, fainting, or any other concerning symptoms.    Thank you for visiting our Emergency Department. It was a pleasure taking care of you today.

## 2024-05-04 NOTE — Progress Notes (Signed)
 [facility]   [facility] Established Patient Note    Date of Service: 05/05/2024        Patient Name: Erin Baker      Date of Birth: [birth year]      MRN: [id number]       Primary Care Physician: Ole Waddell Gaskins, PA-C       Main Pain Anesthesiologist: Darwyn Blanch, MD Main Pain APP: Alana Search, PA-C Last Office visit: 02/03/2024 with Darwyn Blanch, MD for consultation Last Procedure Visit: 04/28/2024 with Janus Blanch, MD for Cervical RFA   Chief Complaint:  Chief Complaint  Patient presents with  . Neck Pain     History of Present Illness:  Erin Baker is a 83 y.o. old female.   Pain Assessment Pain Score  : 5  What number best describes your pain on average in the past week?: 5 What number best describes how, during the past week, pain has interfered with your enjoyment of life?: 5 What number best describes how, during the past week, pain has interfered with your general activity?: 5 PEG Pain Total Score: 5  Pain Description: Since the last visit the pain is improved. The most significant location of pain is the right side neck   The 1-2 words that best describe the pain: aching The pain improves with over the counter medications and radiofrequency ablation. The pain is made worse with lying down for prolonged periods. The average daily pain score is 2/10. The pain can be as high as 5/10 at its worst. The pain interferes with: Sleeping. Therapies: Did the patient have an injection/procedure since the last visit? Yes; right Cervical RFA             - Percentage of pain relief from injection/procedure: 80% or more Has the patient had any new imaging (X-ray, MRI, CT) for pain concerns since the last visit? No Has physical therapy been initiated or completed for this pain concern? No Medications: Was a new medication for pain started by our clinic at the last visit? No   Is the patient on a blood thinner  (including aspirin , Goody/BC/Bayer powder)? Yes             - Name of anticoagulant/blood thinner: Aspirin  81mg    Electronically signed by: Alana Search, PA-C 05/05/2024    Review of Systems:  ROS was performed with positive/negative pertinent findings listed in HPI.  Electronically signed by: Alana Search, PA-C 05/05/2024  Past Medical History: Medical History[1]   Past Surgical History: Surgical History[2]  Family History: Family History[3]  Social History: Social History[4]   Allergies: Allergies[5]  Current Medications: Current Medications[6] ____________________________________________________________________  Physical Exam:   Vitals: Vitals:   05/05/24 1033  BP: 101/75  BP Location: Right arm  Patient Position: Sitting  Pulse: 64  Resp: 14  SpO2: 98%    Constitutional: Well developed, Well nourished, No acute distress and Interactive General: Patient is alert and orientated Head: normocephalic and atraumatic  Respiratory: non labored breathing  Psychological: The patient's mood is normal, and appropriate for the circumstances.  Neuro/Musculoskeletal Exam:  Spine: The spine is straight.   Gait:  normal Uses assist device - no assistive device   Cervical: abnormal ROM, with paraspinal tenderness on the right , with pain with facet loading on the right, Spurling negative bilaterally  _____________________________________________________________________  Controlled Substance Monitoring:  NCPDMP:  Reviewed today  CTE Measures: Is the patient taking prescription opioids? No  __________________________________________________________________  Labs: Relevant Labs Reviewed: Below labs reviewed  Lab Results  Component Value Date   CREATININE 1.09 04/20/2024   EGFR 51 (L) 04/20/2024    Lab Results  Component Value Date   WBC 11.50 (H) 04/20/2024   HGB 12.6 04/20/2024   HCT 38.8 04/20/2024   MCV 82.9 04/20/2024   PLT 206 04/20/2024     Lab Results  Component Value Date   HGBA1C 7.2 (H) 03/29/2023     No results found for: AMPHUR, BARBUR, BENUNUM, BENZUR, COCAINEUR, OPIATEUR, THCUR  --------------------------------------------------------------------------------------------------------------------  Diagnostic Studies:  All applicable diagnostic imaging related to this evaluation have been reviewed.  CT CERVICAL SPINE WITHOUT CONTRAST 03/29/2023   TECHNIQUE: Multidetector CT imaging of the head and cervical spine was performed following the standard protocol without intravenous contrast. Multiplanar CT image reconstructions of the cervical spine were also generated.   RADIATION DOSE REDUCTION: This exam was performed according to the departmental dose-optimization program which includes automated exposure control, adjustment of the mA and/or kV according to patient size and/or use of iterative reconstruction technique.   COMPARISON:  None Available.   FINDINGS: CT HEAD FINDINGS   Brain: No evidence of acute infarction, hemorrhage, hydrocephalus, extra-axial collection or mass lesion/mass effect. Extensive periventricular and deep white matter hypodensity, with foci of cortical encephalomalacia scattered throughout both hemispheres.   Vascular: No hyperdense vessel or unexpected calcification.   Skull: Normal. Negative for fracture or focal lesion.   Sinuses/Orbits: No acute finding.   Other: None.   CT CERVICAL SPINE FINDINGS   Alignment: Straightening of the normal cervical lordosis.   Skull base and vertebrae: No acute fracture. No primary bone lesion or focal pathologic process.   Soft tissues and spinal canal: No prevertebral fluid or swelling. No visible canal hematoma.   Disc levels: Mild multilevel disc space height loss and osteophytosis throughout the cervical spine.   Upper chest: Negative.   Other: Right common and internal carotid artery stent. Partially imaged  left subclavian carotid bypass graft.   IMPRESSION: 1. No acute intracranial pathology. 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 2. No fracture or static subluxation of the cervical spine. 3. Mild multilevel cervical disc degenerative disease.  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 female being evaluated for continued management of her chronic pain.  Patient appears to be in no acute physical distress.  She is by herself on today's visit.  She is sitting comfortably in the chair.  She is status post right-sided C3-C5 radiofrequency ablations conducted on 04/28/2024 for which she cites more than 80% relief.  She states that she is noticing that she is already having improvement in her range of motion.  She still have limited range of motion of the cervical spine as demonstrated on today's visit worse towards the right but according to her that is a big improvement still.  She did side for a few days after the injection she had some increased soreness on the right side of her neck and upper shoulder which has gotten a lot better.  She has been alternating heat and ice and light stretching.  I recommend she continue with these conservative treatment methods.  She is realistic that it is quite early after the procedure to make a generalized evaluation.  She will continue to monitor.  She will call us  if there is any questions or concerns in regards to  her chronic cervical neck pain prior to her next visit which should be somewhere in the next 2 to 3 months or sooner if necessary. ____________________________________________________________________  Recommended Plan of Care:  - Interventional Procedures: - Prior: Right C3-C5 MBB on 03/12/2024 & 04/14/2024 by Dr. Tobie - Prior: Right C3-C5 RFA on 04/28/2024 by Dr.  Tobie ~>80% relief reported on 05/05/2024  - Medical Modalities:  - no changes  - Imaging/Labs:  - none ordered  - Consults/Therapy:  - Continue: heat/ice/light stretches  - Follow Up: - Return in about 3 months (around 08/05/2024) for Follow up.    Treatment plan fully discussed and agreed upon with patient. All questions were answered. The patient was told to contact our clinic with questions/concerns and to report to the Emergency Room if any concerning symptoms develop.  The patient expressed understanding and all questions were answered.  Given the patient's complex chronic pain symptoms and medical history, the patient will require continued follow-up in a longitudinal fashion.   Medical decision making for this patient was moderately complex given the independent interpretation of imaging and lab results, review of relevant records from other healthcare providers,  the severity/progression of their condition, and the risk of complication and/or morbidity that may exist with or without treatment.   Time (>30 minutes) was spent on reviewing patient PMH, medications, procedures and image, performing medically appropriate examination, and counseling and educating patient.  Documentation on day of service.  Electronically signed by: Alana Search, PA-C 05/05/2024       [1] Past Medical History: Diagnosis Date  . Chronic kidney disease   . GERD (gastroesophageal reflux disease)   . Type 2 diabetes mellitus   [2] Past Surgical History: Procedure Laterality Date  . KNEE SURGERY  1995 /2005  . PARTIAL HIP ARTHROPLASTY Right 03/30/2023   ARTHROPLASTY HIP HEMI REPLACEMENT performed by Lonni Starleen Rakes, MD at [facility] OR  . PR CLOSE ENTEROSTOMY,RESEC+COLOREC ANAS    . REPLACEMENT TOTAL HIP LATERAL POSITION    . RHIZOTOMY W/ RADIOFREQUENCY ABLATION Right 04/28/2024   RHIZOTOMY FACET RADIOFREQUENCY  CERVICAL RFA  RIGHT C3-C5 #1/1 performed by Janus Tobie, MD at [facility] OR  . TRIGGER POINT INJECTION Right 03/12/2024   BLOCK/INJECTION MEDIAL BRANCH CERVICAL FACET right sided RIGHT C3-5 MBB 1/2 performed by Darwyn Tobie, MD at [facility] OR  . TRIGGER POINT INJECTION Right 04/14/2024   BLOCK/INJECTION MEDIAL BRANCH  CERVICAL FACET RIGHT C3-C5 #2/2 performed by Darwyn Tobie, MD at [facility] OR  [3] Family History Problem Relation Name Age of Onset  . Breast cancer Mother         82s?  . Kidney disease Mother    . Diabetes type I Mother    . Diabetes type I Brother    [4] Social History Socioeconomic History  . Marital status: Widowed  Tobacco Use  . Smoking status: Former    Types: Cigarettes  . Smokeless tobacco: Never  Vaping Use  . Vaping status: Never Used  Substance and Sexual Activity  . Alcohol use: Not Currently    Comment: Rarely  . Drug use: Never    Comment: Drug use: Denies  . Sexual activity: Not Currently   Social Drivers of Health   Food Insecurity: Low Risk  (04/20/2024)   Food vital sign   . Within the past 12 months, you worried that your food would run out before you got money to buy more: Never true   . Within the past  12 months, the food you bought just didn't last and you didn't have money to get more: Never true  Transportation Needs: No Transportation Needs (04/20/2024)   Transportation   . In the past 12 months, has lack of reliable transportation kept you from medical appointments, meetings, work or from getting things needed for daily living? : No  Safety: Low Risk  (05/05/2024)   Safety   . How often does anyone, including family and friends, physically hurt you?: Never   . How often does anyone, including family and friends, insult or talk down to you?: Never   . How often does anyone, including family and friends, threaten you with harm?: Never   . How often does anyone, including family and friends, scream or curse at you?: Never  Living Situation: Low Risk  (04/20/2024)   Living Situation   . What is  your living situation today?: I have a steady place to live   . Think about the place you live. Do you have problems with any of the following? Choose all that apply:: None/None on this list  [5] Allergies Allergen Reactions  . Levofloxacin Dermatitis and Rash    Other reaction(s): Cutaneous eruption (morphologic abnormality), Redness  . Nitrofurantoin Monohyd/M-Cryst Rash, Shortness Of Breath and Swelling    Other reaction(s): Laryngeal Edema (ALLERGY)  . Oxycodone-Acetaminophen  Shortness Of Breath and Nausea And Vomiting  . Sulfa (Sulfonamide Antibiotics) Itching, Other (See Comments) and Rash  . Sulfamethoxazole-Trimethoprim Rash  . Gabapentin Other (See Comments)    Other reaction(s): Confusion, Delusions (intolerance), Hallucinations (finding), Clouded consciousness (finding) Made her crazy   . Latex Hives and Other (See Comments)    Other reaction(s): Unknown  . Oxycodone Other (See Comments)    Other reaction(s): Delusions (intolerance) Makes her crazy   . Adhesive Dermatitis    Other reaction(s): Blister (morphologic abnormality)  . Surgical Tape     Other reaction(s): Blister (morphologic abnormality)  . Tramadol Other (See Comments)    hyperactivity hyperactivity   . Meloxicam Nausea Only  . Morphine Anxiety and Nausea And Vomiting    Other reaction(s): Other (See Comments), Restless Other Reaction: DISORIENTED   [6] Current Outpatient Medications  Medication Sig Dispense Refill  . acetaminophen  (TYLENOL ) 500 mg tablet Take 325 mg by mouth as needed.    . atorvastatin  (LIPITOR ) 80 mg tablet Take 1 tablet (80 mg total) by mouth every morning. 90 tablet 1  . clindamycin (CLEOCIN) 300 mg capsule Take 300 mg by mouth 3 (three) times a day.    . cyanocobalamin  (VITAMIN B12) 1,000 mcg tablet Take 1,000 mcg by mouth daily.    . cyclobenzaprine  (FLEXERIL ) 10 mg tablet TAKE 1 TABLET BY MOUTH EVERY DAY NIGHTLY AS NEEDED FOR MUSCLE SPASMS 30 tablet 0  . diclofenac sodium  (VOLTAREN) 1 % gel Apply 3 times per day 100 g 0  . empagliflozin (Jardiance) 10 mg tab Take 1 tablet (10 mg total) by mouth daily. 90 tablet 1  . ergocalciferol (VITAMIN D2) 1,250 mcg (50,000 unit) capsule Take 1 capsule (50,000 Units total) by mouth once a week. 12 capsule 0  . ferrous sulfate  325 mg (65 mg iron) tablet Take 325 mg by mouth 2 (two) times weekly.    . furosemide  (LASIX ) 20 mg tablet TAKE 1 TABLET (20 MG TOTAL) BY MOUTH DAILY AS NEEDED (ONLY IF SWELLS UP). 30 tablet 0  . glipiZIDE (GLUCOTROL XL) 10 mg 24 hr tablet Take 1 tablet (10 mg total) by mouth daily with breakfast.  90 tablet 3  . insulin  aspart U-100 (NovoLOG ) 100 unit/mL injection Inject under the skin. 20-25 units every morning    . insulin  glargine (LANTUS SoloStar) 100 unit/mL (3 mL) pen Inject 25 units Dent daily and adjust as recommended 15 mL 5  . lancets 21 gauge misc 1 each by Other route. Four times daily starting 04/07/2023    . Lantus U-100 Insulin  100 unit/mL injection Please specify directions, refills and quantity 10 mL 5  . latanoprost (XALATAN) 0.005 % ophthalmic solution Administer 1 drop into the right eye at bedtime. 7.5 mL 11  . levothyroxine  (SYNTHROID ) 112 mcg tablet Take 1 tablet (112 mcg total) by mouth in the morning. 90 tablet 3  . metoprolol  tartrate (LOPRESSOR ) 25 mg tablet Take 1 tablet (25 mg total) by mouth 2 (two) times a day. Patient still taking 180 tablet 3  . nitroglycerin (NITROSTAT) 0.4 mg SL tablet Place 1 tablet (0.4 mg total) under the tongue every 5 (five) minutes as needed for chest pain. 25 tablet 0  . pantoprazole  (PROTONIX ) 40 mg EC tablet TAKE 1 TABLET BY MOUTH TWICE A DAY 180 tablet 1  . pen needle, diabetic 32 gauge x 5/32 ndle Use to inject insulin  under the skin once daily. 100 each 2  . aspirin  81 mg EC tablet Take 4 tablets (325 mg total) by mouth in the morning and 4 tablets (325 mg total) in the evening. Take after meals. (Patient taking differently: Take 81 mg by mouth  daily.) 720 tablet 0   No current facility-administered medications for this visit.
